# Patient Record
Sex: Female | Born: 1945 | Race: White | Hispanic: No | Smoking: Never smoker
Health system: Southern US, Community
[De-identification: ages and names within clinical notes are randomized; demographics above are authoritative.]

## PROBLEM LIST (undated history)

## (undated) DIAGNOSIS — M1990 Unspecified osteoarthritis, unspecified site: Secondary | ICD-10-CM

## (undated) DIAGNOSIS — E785 Hyperlipidemia, unspecified: Secondary | ICD-10-CM

## (undated) DIAGNOSIS — K219 Gastro-esophageal reflux disease without esophagitis: Secondary | ICD-10-CM

## (undated) DIAGNOSIS — M949 Disorder of cartilage, unspecified: Secondary | ICD-10-CM

## (undated) DIAGNOSIS — I10 Essential (primary) hypertension: Secondary | ICD-10-CM

## (undated) DIAGNOSIS — M545 Low back pain, unspecified: Secondary | ICD-10-CM

## (undated) DIAGNOSIS — J309 Allergic rhinitis, unspecified: Secondary | ICD-10-CM

## (undated) DIAGNOSIS — G43909 Migraine, unspecified, not intractable, without status migrainosus: Secondary | ICD-10-CM

## (undated) DIAGNOSIS — H8110 Benign paroxysmal vertigo, unspecified ear: Secondary | ICD-10-CM

## (undated) DIAGNOSIS — N200 Calculus of kidney: Secondary | ICD-10-CM

## (undated) DIAGNOSIS — M899 Disorder of bone, unspecified: Secondary | ICD-10-CM

## (undated) DIAGNOSIS — M810 Age-related osteoporosis without current pathological fracture: Secondary | ICD-10-CM

## (undated) DIAGNOSIS — E039 Hypothyroidism, unspecified: Secondary | ICD-10-CM

## (undated) HISTORY — DX: Unspecified osteoarthritis, unspecified site: M19.90

## (undated) HISTORY — DX: Age-related osteoporosis without current pathological fracture: M81.0

## (undated) HISTORY — DX: Low back pain, unspecified: M54.50

## (undated) HISTORY — DX: Disorder of cartilage, unspecified: M94.9

## (undated) HISTORY — DX: Gastro-esophageal reflux disease without esophagitis: K21.9

## (undated) HISTORY — DX: Migraine, unspecified, not intractable, without status migrainosus: G43.909

## (undated) HISTORY — DX: Calculus of kidney: N20.0

## (undated) HISTORY — DX: Allergic rhinitis, unspecified: J30.9

## (undated) HISTORY — DX: Essential (primary) hypertension: I10

## (undated) HISTORY — DX: Benign paroxysmal vertigo, unspecified ear: H81.10

## (undated) HISTORY — DX: Hyperlipidemia, unspecified: E78.5

## (undated) HISTORY — DX: Hypothyroidism, unspecified: E03.9

## (undated) HISTORY — DX: Disorder of bone, unspecified: M89.9

## (undated) HISTORY — DX: Low back pain: M54.5

---

## 1977-11-29 HISTORY — PX: TUBAL LIGATION: SHX77

## 1999-02-02 ENCOUNTER — Ambulatory Visit (HOSPITAL_COMMUNITY): Admission: RE | Admit: 1999-02-02 | Discharge: 1999-02-02 | Payer: Self-pay | Admitting: *Deleted

## 1999-02-09 ENCOUNTER — Ambulatory Visit (HOSPITAL_COMMUNITY): Admission: RE | Admit: 1999-02-09 | Discharge: 1999-02-09 | Payer: Self-pay | Admitting: Unknown Physician Specialty

## 1999-11-30 HISTORY — PX: HAND SURGERY: SHX662

## 2000-10-16 ENCOUNTER — Encounter: Payer: Self-pay | Admitting: Emergency Medicine

## 2000-10-16 ENCOUNTER — Emergency Department (HOSPITAL_COMMUNITY): Admission: EM | Admit: 2000-10-16 | Discharge: 2000-10-16 | Payer: Self-pay | Admitting: Emergency Medicine

## 2000-10-25 ENCOUNTER — Ambulatory Visit (HOSPITAL_BASED_OUTPATIENT_CLINIC_OR_DEPARTMENT_OTHER): Admission: RE | Admit: 2000-10-25 | Discharge: 2000-10-26 | Payer: Self-pay | Admitting: Orthopedic Surgery

## 2004-01-07 ENCOUNTER — Emergency Department (HOSPITAL_COMMUNITY): Admission: EM | Admit: 2004-01-07 | Discharge: 2004-01-07 | Payer: Self-pay

## 2004-03-23 ENCOUNTER — Ambulatory Visit (HOSPITAL_COMMUNITY): Admission: RE | Admit: 2004-03-23 | Discharge: 2004-03-23 | Payer: Self-pay | Admitting: Orthopaedic Surgery

## 2004-04-01 ENCOUNTER — Ambulatory Visit (HOSPITAL_COMMUNITY): Admission: RE | Admit: 2004-04-01 | Discharge: 2004-04-01 | Payer: Self-pay | Admitting: Family Medicine

## 2004-10-13 ENCOUNTER — Ambulatory Visit: Payer: Self-pay | Admitting: Internal Medicine

## 2004-10-27 ENCOUNTER — Ambulatory Visit: Payer: Self-pay | Admitting: Internal Medicine

## 2004-11-27 ENCOUNTER — Encounter: Admission: RE | Admit: 2004-11-27 | Discharge: 2004-11-27 | Payer: Self-pay | Admitting: Internal Medicine

## 2005-10-12 ENCOUNTER — Ambulatory Visit: Payer: Self-pay | Admitting: Internal Medicine

## 2006-05-19 ENCOUNTER — Ambulatory Visit: Payer: Self-pay | Admitting: Internal Medicine

## 2006-10-25 ENCOUNTER — Ambulatory Visit: Payer: Self-pay | Admitting: Internal Medicine

## 2008-03-20 ENCOUNTER — Ambulatory Visit: Payer: Self-pay | Admitting: Internal Medicine

## 2008-03-20 DIAGNOSIS — J069 Acute upper respiratory infection, unspecified: Secondary | ICD-10-CM | POA: Insufficient documentation

## 2008-03-20 DIAGNOSIS — R42 Dizziness and giddiness: Secondary | ICD-10-CM | POA: Insufficient documentation

## 2008-03-25 ENCOUNTER — Telehealth: Payer: Self-pay | Admitting: Family Medicine

## 2008-03-25 ENCOUNTER — Ambulatory Visit: Payer: Self-pay | Admitting: Internal Medicine

## 2008-03-25 DIAGNOSIS — R059 Cough, unspecified: Secondary | ICD-10-CM | POA: Insufficient documentation

## 2008-03-25 DIAGNOSIS — R05 Cough: Secondary | ICD-10-CM

## 2008-03-28 ENCOUNTER — Ambulatory Visit: Payer: Self-pay | Admitting: Internal Medicine

## 2008-04-01 ENCOUNTER — Telehealth: Payer: Self-pay | Admitting: Internal Medicine

## 2008-04-16 ENCOUNTER — Ambulatory Visit: Payer: Self-pay | Admitting: Internal Medicine

## 2008-05-07 ENCOUNTER — Ambulatory Visit: Payer: Self-pay | Admitting: Internal Medicine

## 2008-05-09 ENCOUNTER — Ambulatory Visit: Payer: Self-pay | Admitting: Cardiology

## 2008-05-09 ENCOUNTER — Telehealth: Payer: Self-pay | Admitting: Internal Medicine

## 2008-05-21 ENCOUNTER — Ambulatory Visit: Payer: Self-pay | Admitting: Internal Medicine

## 2008-05-21 DIAGNOSIS — K219 Gastro-esophageal reflux disease without esophagitis: Secondary | ICD-10-CM | POA: Insufficient documentation

## 2008-05-24 ENCOUNTER — Encounter: Payer: Self-pay | Admitting: Internal Medicine

## 2008-05-24 ENCOUNTER — Ambulatory Visit: Payer: Self-pay | Admitting: Internal Medicine

## 2008-06-17 ENCOUNTER — Ambulatory Visit: Payer: Self-pay | Admitting: Internal Medicine

## 2008-06-17 DIAGNOSIS — H9209 Otalgia, unspecified ear: Secondary | ICD-10-CM | POA: Insufficient documentation

## 2008-08-19 ENCOUNTER — Ambulatory Visit: Payer: Self-pay | Admitting: Internal Medicine

## 2008-11-11 ENCOUNTER — Ambulatory Visit: Payer: Self-pay | Admitting: Internal Medicine

## 2008-11-11 DIAGNOSIS — I10 Essential (primary) hypertension: Secondary | ICD-10-CM | POA: Insufficient documentation

## 2008-11-11 DIAGNOSIS — I498 Other specified cardiac arrhythmias: Secondary | ICD-10-CM | POA: Insufficient documentation

## 2008-11-26 ENCOUNTER — Telehealth (INDEPENDENT_AMBULATORY_CARE_PROVIDER_SITE_OTHER): Payer: Self-pay | Admitting: *Deleted

## 2008-12-03 ENCOUNTER — Ambulatory Visit: Payer: Self-pay | Admitting: Internal Medicine

## 2008-12-16 ENCOUNTER — Ambulatory Visit: Payer: Self-pay | Admitting: Internal Medicine

## 2008-12-16 LAB — CONVERTED CEMR LAB
Albumin: 3.8 g/dL (ref 3.5–5.2)
BUN: 12 mg/dL (ref 6–23)
Basophils Relative: 0 % (ref 0.0–3.0)
Calcium: 9.7 mg/dL (ref 8.4–10.5)
Chloride: 104 meq/L (ref 96–112)
Creatinine, Ser: 0.8 mg/dL (ref 0.4–1.2)
Eosinophils Relative: 2 % (ref 0.0–5.0)
Glucose, Bld: 124 mg/dL — ABNORMAL HIGH (ref 70–99)
H Pylori IgG: NEGATIVE
HCT: 39.9 % (ref 36.0–46.0)
HDL: 47.9 mg/dL (ref >39.0)
MCHC: 34.5 g/dL (ref 30.0–36.0)
Monocytes Absolute: 1 10*3/uL (ref 0.1–1.0)
Neutro Abs: 11.1 10*3/uL — ABNORMAL HIGH (ref 1.4–7.7)
Neutrophils Relative %: 73.4 % (ref 43.0–77.0)
Sodium: 140 meq/L (ref 135–145)
T4, Total: 10 ug/dL (ref 5.0–12.5)
Total Bilirubin: 1.3 mg/dL — ABNORMAL HIGH (ref 0.3–1.2)
Total CHOL/HDL Ratio: 7.2
VLDL: 30 mg/dL (ref 0–40)

## 2008-12-23 ENCOUNTER — Ambulatory Visit: Payer: Self-pay | Admitting: Internal Medicine

## 2008-12-23 DIAGNOSIS — K625 Hemorrhage of anus and rectum: Secondary | ICD-10-CM | POA: Insufficient documentation

## 2008-12-23 DIAGNOSIS — E785 Hyperlipidemia, unspecified: Secondary | ICD-10-CM | POA: Insufficient documentation

## 2008-12-23 DIAGNOSIS — R7301 Impaired fasting glucose: Secondary | ICD-10-CM | POA: Insufficient documentation

## 2009-01-20 ENCOUNTER — Ambulatory Visit: Payer: Self-pay | Admitting: Gastroenterology

## 2009-01-27 ENCOUNTER — Ambulatory Visit: Payer: Self-pay | Admitting: Internal Medicine

## 2009-01-27 LAB — CONVERTED CEMR LAB
ALT: 17 units/L (ref 0–35)
BUN: 24 mg/dL — ABNORMAL HIGH (ref 6–23)
CO2: 28 meq/L (ref 19–32)
Chloride: 104 meq/L (ref 96–112)
Glucose, Bld: 117 mg/dL — ABNORMAL HIGH (ref 70–99)
LDL Cholesterol: 96 mg/dL (ref 0–99)
Total CHOL/HDL Ratio: 3.4
VLDL: 20 mg/dL (ref 0–40)

## 2009-01-31 ENCOUNTER — Ambulatory Visit: Payer: Self-pay | Admitting: Gastroenterology

## 2009-01-31 LAB — HM COLONOSCOPY

## 2009-02-03 ENCOUNTER — Ambulatory Visit: Payer: Self-pay | Admitting: Internal Medicine

## 2009-02-03 ENCOUNTER — Telehealth: Payer: Self-pay | Admitting: Internal Medicine

## 2009-02-03 ENCOUNTER — Ambulatory Visit (HOSPITAL_BASED_OUTPATIENT_CLINIC_OR_DEPARTMENT_OTHER): Admission: RE | Admit: 2009-02-03 | Discharge: 2009-02-03 | Payer: Self-pay | Admitting: Internal Medicine

## 2009-02-03 ENCOUNTER — Ambulatory Visit: Payer: Self-pay | Admitting: Diagnostic Radiology

## 2009-02-03 DIAGNOSIS — M25569 Pain in unspecified knee: Secondary | ICD-10-CM | POA: Insufficient documentation

## 2009-02-20 ENCOUNTER — Encounter: Payer: Self-pay | Admitting: Internal Medicine

## 2009-02-20 ENCOUNTER — Encounter: Admission: RE | Admit: 2009-02-20 | Discharge: 2009-04-08 | Payer: Self-pay | Admitting: Internal Medicine

## 2009-03-17 ENCOUNTER — Ambulatory Visit: Payer: Self-pay | Admitting: Internal Medicine

## 2009-05-06 ENCOUNTER — Telehealth: Payer: Self-pay | Admitting: Internal Medicine

## 2009-05-09 ENCOUNTER — Telehealth: Payer: Self-pay | Admitting: Internal Medicine

## 2009-06-09 ENCOUNTER — Telehealth: Payer: Self-pay | Admitting: Family Medicine

## 2009-06-23 ENCOUNTER — Ambulatory Visit: Payer: Self-pay | Admitting: Internal Medicine

## 2009-06-23 DIAGNOSIS — T7020XA Unspecified effects of high altitude, initial encounter: Secondary | ICD-10-CM | POA: Insufficient documentation

## 2009-07-08 ENCOUNTER — Encounter: Payer: Self-pay | Admitting: Internal Medicine

## 2009-07-08 ENCOUNTER — Telehealth: Payer: Self-pay | Admitting: Internal Medicine

## 2009-09-03 ENCOUNTER — Ambulatory Visit: Payer: Self-pay | Admitting: Internal Medicine

## 2009-09-03 DIAGNOSIS — L259 Unspecified contact dermatitis, unspecified cause: Secondary | ICD-10-CM | POA: Insufficient documentation

## 2009-09-09 ENCOUNTER — Ambulatory Visit: Payer: Self-pay | Admitting: Internal Medicine

## 2009-09-09 LAB — CONVERTED CEMR LAB
ALT: 21 units/L (ref 0–35)
Alkaline Phosphatase: 81 units/L (ref 39–117)
BUN: 20 mg/dL (ref 6–23)
Bilirubin, Direct: 0.2 mg/dL (ref 0.0–0.3)
CO2: 23 meq/L (ref 19–32)
Calcium: 9.6 mg/dL (ref 8.4–10.5)
Creatinine, Ser: 0.9 mg/dL (ref 0.4–1.2)
GFR calc non Af Amer: 66.88 mL/min (ref >60)
Hgb A1c MFr Bld: 5.6 % (ref 4.6–6.5)
Potassium: 4.3 meq/L (ref 3.5–5.1)
TSH: 3.35 microintl units/mL (ref 0.35–5.50)
Total Bilirubin: 0.9 mg/dL (ref 0.3–1.2)
Total CHOL/HDL Ratio: 4

## 2009-09-15 ENCOUNTER — Ambulatory Visit: Payer: Self-pay | Admitting: Internal Medicine

## 2009-09-16 ENCOUNTER — Encounter: Admission: RE | Admit: 2009-09-16 | Discharge: 2009-09-24 | Payer: Self-pay | Admitting: Internal Medicine

## 2009-09-22 ENCOUNTER — Telehealth: Payer: Self-pay | Admitting: Internal Medicine

## 2009-09-24 ENCOUNTER — Telehealth: Payer: Self-pay | Admitting: Internal Medicine

## 2009-09-29 ENCOUNTER — Encounter: Payer: Self-pay | Admitting: Internal Medicine

## 2009-10-09 ENCOUNTER — Encounter: Payer: Self-pay | Admitting: Internal Medicine

## 2009-10-14 ENCOUNTER — Telehealth: Payer: Self-pay | Admitting: Internal Medicine

## 2009-11-14 ENCOUNTER — Ambulatory Visit: Payer: Self-pay | Admitting: Internal Medicine

## 2009-11-14 DIAGNOSIS — L919 Hypertrophic disorder of the skin, unspecified: Secondary | ICD-10-CM

## 2009-11-14 DIAGNOSIS — L239 Allergic contact dermatitis, unspecified cause: Secondary | ICD-10-CM | POA: Insufficient documentation

## 2009-11-14 DIAGNOSIS — L909 Atrophic disorder of skin, unspecified: Secondary | ICD-10-CM | POA: Insufficient documentation

## 2010-01-09 ENCOUNTER — Ambulatory Visit (HOSPITAL_BASED_OUTPATIENT_CLINIC_OR_DEPARTMENT_OTHER): Admission: RE | Admit: 2010-01-09 | Discharge: 2010-01-09 | Payer: Self-pay | Admitting: Internal Medicine

## 2010-01-09 ENCOUNTER — Ambulatory Visit: Payer: Self-pay | Admitting: Diagnostic Radiology

## 2010-01-09 ENCOUNTER — Telehealth: Payer: Self-pay | Admitting: Internal Medicine

## 2010-01-09 ENCOUNTER — Ambulatory Visit: Payer: Self-pay | Admitting: Internal Medicine

## 2010-01-09 DIAGNOSIS — N200 Calculus of kidney: Secondary | ICD-10-CM | POA: Insufficient documentation

## 2010-01-09 DIAGNOSIS — R109 Unspecified abdominal pain: Secondary | ICD-10-CM | POA: Insufficient documentation

## 2010-01-09 LAB — CONVERTED CEMR LAB
Bilirubin Urine: NEGATIVE
Glucose, Urine, Semiquant: NEGATIVE
Nitrite: NEGATIVE
Specific Gravity, Urine: >=1.03
pH: 5

## 2010-01-12 ENCOUNTER — Encounter: Payer: Self-pay | Admitting: Internal Medicine

## 2010-01-12 ENCOUNTER — Ambulatory Visit (HOSPITAL_BASED_OUTPATIENT_CLINIC_OR_DEPARTMENT_OTHER): Admission: RE | Admit: 2010-01-12 | Discharge: 2010-01-12 | Payer: Self-pay | Admitting: Urology

## 2010-01-12 DIAGNOSIS — Z87442 Personal history of urinary calculi: Secondary | ICD-10-CM | POA: Insufficient documentation

## 2010-01-12 HISTORY — PX: OTHER SURGICAL HISTORY: SHX169

## 2010-03-19 ENCOUNTER — Telehealth: Payer: Self-pay | Admitting: Internal Medicine

## 2010-05-08 ENCOUNTER — Ambulatory Visit: Payer: Self-pay | Admitting: Internal Medicine

## 2010-05-08 DIAGNOSIS — L609 Nail disorder, unspecified: Secondary | ICD-10-CM | POA: Insufficient documentation

## 2010-05-11 ENCOUNTER — Telehealth: Payer: Self-pay | Admitting: Internal Medicine

## 2010-05-15 ENCOUNTER — Telehealth: Payer: Self-pay | Admitting: Internal Medicine

## 2010-06-03 ENCOUNTER — Telehealth: Payer: Self-pay | Admitting: Internal Medicine

## 2010-06-17 ENCOUNTER — Telehealth: Payer: Self-pay | Admitting: Internal Medicine

## 2010-08-12 ENCOUNTER — Encounter: Payer: Self-pay | Admitting: Internal Medicine

## 2010-08-31 ENCOUNTER — Encounter: Payer: Self-pay | Admitting: Internal Medicine

## 2010-09-01 ENCOUNTER — Encounter: Payer: Self-pay | Admitting: Internal Medicine

## 2010-10-26 ENCOUNTER — Telehealth: Payer: Self-pay | Admitting: Internal Medicine

## 2010-10-26 ENCOUNTER — Encounter: Payer: Self-pay | Admitting: Internal Medicine

## 2010-10-26 LAB — CONVERTED CEMR LAB
Bilirubin, Direct: 0.1 mg/dL (ref 0.0–0.3)
Cholesterol: 178 mg/dL (ref 0–200)
HDL: 59 mg/dL (ref >39)
TSH: 1.879 microintl units/mL (ref 0.350–4.500)
Total Protein: 6.9 g/dL (ref 6.0–8.3)
Triglycerides: 89 mg/dL (ref <150)

## 2010-11-02 ENCOUNTER — Ambulatory Visit: Payer: Self-pay | Admitting: Internal Medicine

## 2010-11-02 ENCOUNTER — Ambulatory Visit (HOSPITAL_BASED_OUTPATIENT_CLINIC_OR_DEPARTMENT_OTHER)
Admission: RE | Admit: 2010-11-02 | Discharge: 2010-11-02 | Payer: Self-pay | Source: Home / Self Care | Admitting: Internal Medicine

## 2010-11-02 DIAGNOSIS — M810 Age-related osteoporosis without current pathological fracture: Secondary | ICD-10-CM | POA: Insufficient documentation

## 2010-11-02 DIAGNOSIS — E039 Hypothyroidism, unspecified: Secondary | ICD-10-CM | POA: Insufficient documentation

## 2010-11-02 DIAGNOSIS — M79609 Pain in unspecified limb: Secondary | ICD-10-CM | POA: Insufficient documentation

## 2010-11-02 LAB — CONVERTED CEMR LAB: Thyroperoxidase Ab SerPl-aCnc: <10 (ref <35.0)

## 2010-11-02 LAB — HM MAMMOGRAPHY

## 2010-11-09 ENCOUNTER — Encounter: Payer: Self-pay | Admitting: Internal Medicine

## 2010-11-16 ENCOUNTER — Ambulatory Visit: Payer: Self-pay | Admitting: Family Medicine

## 2010-11-16 ENCOUNTER — Encounter: Payer: Self-pay | Admitting: Internal Medicine

## 2010-11-16 ENCOUNTER — Telehealth: Payer: Self-pay | Admitting: Internal Medicine

## 2010-12-03 ENCOUNTER — Encounter: Payer: Self-pay | Admitting: Internal Medicine

## 2010-12-24 ENCOUNTER — Telehealth: Payer: Self-pay | Admitting: Internal Medicine

## 2010-12-31 NOTE — Letter (Signed)
   [place] at [facility] [address] [place], [place]  [address]  [place] Phone: [phone]      November 02, 2010   [place] [address] [place], [place] [address]  RE:  LAB RESULTS  Dear  Ms. Zahler,  The following is an interpretation of your most recent lab tests.  Please take note of any instructions provided or changes to medications that have resulted from your lab work.  LIVER FUNCTION TESTS:  Good - no changes needed  LIPID PANEL:  Good - no changes needed Triglyceride: 89   Cholesterol: 178   LDL: 101   HDL: 59   Chol/HDL%:  3.0 Ratio  THYROID STUDIES:  Thyroid studies normal TSH: 1.879           Sincerely Yours,    Dr. Thomos Lemons  Appended Document:  mailed

## 2010-12-31 NOTE — Progress Notes (Signed)
  Phone Note Outgoing Call   Summary of Call: call pt - CT scan shows 6 mm stone.  I suggest referral to urologist Initial call taken by: D. Thomos Lemons DO,  January 09, 2010 3:14 PM  Follow-up for Phone Call        Appt  [facility]  Dr   Annabell Howells   Feb  14   Pt notified  Follow-up by: Darral Dash,  January 12, 2010 9:28 AM  New Problems: RENAL CALCULUS, RIGHT (ICD-592.0)   New Problems: RENAL CALCULUS, RIGHT (ICD-592.0)

## 2010-12-31 NOTE — Progress Notes (Signed)
Summary: [facility] needs new code  Phone Note From Other Clinic   Caller: Provider Summary of Call: Vitamin D & T4 & TSH Medicare states 703.9 does not support these tests, please call [phone] [facility] Initial call taken by: Lannette Donath,  June 03, 2010 2:06 PM  Follow-up for Phone Call        call returned to [facility] at [facility] she was provided 272.4 for TSH and T4, which was acceptable, and she states they will have to write off the charges for the Vitamin D test. Follow-up by: Glendell Docker CMA,  June 04, 2010 9:09 AM

## 2010-12-31 NOTE — Progress Notes (Signed)
Summary: refill--simvastatin  Phone Note Refill Request Message from:  Fax from [facility] on December 24, 2010 11:38 AM  Refills Requested: Medication #1:  SIMVASTATIN 40 MG TABS one half tab by mouth qpm   Dosage confirmed as above?Dosage Confirmed   Supply Requested: 45   Last Refilled: 09/17/2010 Initial call taken by: Mervin Kung CMA (AAMA),  December 24, 2010 11:41 AM    Prescriptions: SIMVASTATIN 40 MG TABS (SIMVASTATIN) one half tab by mouth qpm  #45 x 0   Entered by:   Mervin Kung CMA (AAMA)   Authorized by:   D. Thomos Lemons DO   Signed by:   Mervin Kung CMA (AAMA) on 12/24/2010   Method used:   Faxed to ...       [facility] (mail-order)             , [place]         Ph: [phone]       Fax: [phone]   RxID:   [id number]

## 2010-12-31 NOTE — Assessment & Plan Note (Signed)
Summary: 6 month follow up/mhf--room 2   Vitals Entered By: Mervin Kung CMA (May 08, 2010 2:08 PM) Is Patient Diabetic? Yes   Primary Care Provider:  Dondra Spry DO   History of Present Illness:  65 y/o white female for follow up prev diagnosed with kidney stone had stone extraction with nephrologist feeling much better  still getting intermittent vertigo despite not taking bp meds  gerd - good as long as she takes her PPI  Allergies: 1)  ! Sulfa  Past History:  Past Medical History: Benign positional vertigo Hypertension History of allergic rhinitis    Asthma      Nephrolithiasis, hx of  Past Surgical History: Tubal ligation in 1979   Surgery of the right hand to rebuild joint by Dr. Josephine Igo in 2001       Review of Systems       c/o of ridges on fingernails.  she is concerned she may have underactive thyroid  Physical Exam  General:  alert, well-developed, and well-nourished.   Neck:  supple and no masses.   Lungs:  normal respiratory effort, normal breath sounds, and no wheezes.   Heart:  normal rate, regular rhythm, and no gallop.   Extremities:  No lower extremity edema    Impression & Recommendations:  Problem # 1:  RENAL CALCULUS, RIGHT (ICD-592.0) underwent extraction with Dr. Annabell Howells  Problem # 2:  NAIL DISORDER (ICD-703.9) she as ridges on her nails.  rule out hypothyroidism, vit d def Orders: T-TSH [phone]) T-T4, Free [phone]) T- * Misc. Laboratory test [phone])  Problem # 3:  HYPERTENSION, ESSENTIAL NOS (ICD-401.9) BP stable w/o meds.  BP meds stopped due to vertigo / dizziness BP today: 138/70 Prior BP: 150/90 (01/09/2010)  Labs Reviewed: K+: 4.3 (09/09/2009) Creat: : 0.9 (09/09/2009)   Chol: 176 (09/09/2009)   HDL: 49.80 (09/09/2009)   LDL: 110 (09/09/2009)   TG: 82.0 (09/09/2009)  Complete Medication List: 1)  Simvastatin 40 Mg Tabs (Simvastatin) .... One half tab by mouth qpm 2)  Fish Oil 1000 Mg Caps  (Omega-3 fatty acids) .... Take 2 tabs two times a day 3)  Psyllium 58.6 % Powd (Psyllium) .... Use as directed two times a day 4)  Black Cohosh 40 Mg Caps (Black cohosh) .... Take 1 tab by mouth at bedtime 5)  Glucosamine-chondroitin 1500-1200 Mg/26ml Liqd (Glucosamine-chondroitin) .... Take 1 tablet by mouth two times a day 6)  Multivitamins Tabs (Multiple vitamin) .... Take 1 tablet by mouth once a day 7)  Omeprazole 40 Mg Cpdr (Omeprazole) .... One by mouth once daily 8)  Fluticasone Propionate 50 Mcg/act Susp (Fluticasone propionate) .... 2 sprays each nostril once daily 9)  Levothyroxine Sodium 25 Mcg Tabs (Levothyroxine sodium) .... One by mouth once daily  Patient Instructions: 1)  Please schedule a follow-up appointment in 6 months. Prescriptions: FLUTICASONE PROPIONATE 50 MCG/ACT SUSP (FLUTICASONE PROPIONATE) 2 sprays each nostril once daily  #1 x 3   Entered and Authorized by:   D. Thomos Lemons DO   Signed by:   D. Thomos Lemons DO on 05/08/2010   Method used:   Electronically to        [facility]  [phone]* (retail)       [address]       [place], [place]  [address]       Ph: [phone] or [phone]       Fax: [phone]   RxID:  [id number] OMEPRAZOLE 40 MG CPDR (OMEPRAZOLE) one by mouth once daily  #30 x 5   Entered and Authorized by:   D. Thomos Lemons DO   Signed by:   D. Thomos Lemons DO on 05/08/2010   Method used:   Electronically to        [facility]  [phone]* (retail)       [address]       [place], [place]  [address]       Ph: [phone] or [phone]       Fax: [phone]   RxID:   [id number]     Current Allergies: ! SULFA

## 2010-12-31 NOTE — Progress Notes (Signed)
Summary: Lab Results  Phone Note Outgoing Call   Summary of Call: call pt - thyroid blood test is borderline.  I suggest we start thyroid medication.  see rx plz arrange repeat TSH and thyroid antibodies in 2 months 244.9  also have pt cut simvastatin dose in half Initial call taken by: D. Thomos Lemons DO,  May 11, 2010 2:08 PM  Follow-up for Phone Call        call placed to patient, patients father states patient ask that I call back later, he was advised to have patient to return call when she becomes available. [place] verbalized understanding and agrees Follow-up by: Glendell Docker CMA,  May 13, 2010 9:32 AM  Additional Follow-up for Phone Call Additional follow up Details #1::        patient returned phone call and asked to contacted at [phone] or [phone]. Spoke with patient  she states that she will be in [place] for all of August and the first 2 weeks in September. She has been informed of the Thyroid medication, and she would like that sent into [facility] [address], she is aware to cut the Simvastatin in half Additional Follow-up by: Glendell Docker CMA,  May 13, 2010 2:31 PM    New/Updated Medications: SIMVASTATIN 40 MG TABS (SIMVASTATIN) one half tab by mouth qpm

## 2010-12-31 NOTE — Progress Notes (Signed)
Summary: Omeprazole Refill  Phone Note Refill Request Message from:  Patient on October 26, 2010 10:43 AM  Refills Requested: Medication #1:  OMEPRAZOLE 40 MG CPDR one by mouth once daily   Dosage confirmed as above?Dosage Confirmed   Brand Name Necessary? No   Supply Requested: 3 months she needs a 90 day rx [facility]  [id number]   Method Requested: Electronic Next Appointment Scheduled: 11-02-2010 Dr Artist Pais  Initial call taken by: Roselle Locus,  October 26, 2010 10:44 AM  Follow-up for Phone Call        Rx faxed to pharmacy Follow-up by: Glendell Docker CMA,  October 26, 2010 2:10 PM    Prescriptions: OMEPRAZOLE 40 MG CPDR (OMEPRAZOLE) one by mouth once daily  #90 x 3   Entered by:   Glendell Docker CMA   Authorized by:   D. Thomos Lemons DO   Signed by:   Glendell Docker CMA on 10/26/2010   Method used:   [facility] ...       [facility]             , [place]         Ph:        Fax: [phone]   RxID:   [id number]

## 2010-12-31 NOTE — Progress Notes (Signed)
Summary: thyroid verification  Phone Note Call from Patient Call back at [phone] Can leave message on machine.   Caller: Patient Call For: D. Thomos Lemons DO Summary of Call: Pt called wanting to know where we sent thyroid rx.  Advised pt it has not been sent in yet but we will call her Monday and let her know when it has been completed.  Pt wants rx sent to [facility] [address].  What dose is pt. supposed to be taking?  Please advise.  Mervin Kung CMA  May 15, 2010 2:40 PM   Follow-up for Phone Call        patient advised per Dr Artist Pais instructions, rx sent to pharmacy Follow-up by: Glendell Docker CMA,  May 18, 2010 5:17 PM    New/Updated Medications: LEVOTHYROXINE SODIUM 25 MCG TABS (LEVOTHYROXINE SODIUM) one by mouth once daily Prescriptions: LEVOTHYROXINE SODIUM 25 MCG TABS (LEVOTHYROXINE SODIUM) one by mouth once daily  #30 x 2   Entered and Authorized by:   D. Thomos Lemons DO   Signed by:   D. Thomos Lemons DO on 05/18/2010   Method used:   Electronically to        [facility] (retail)       [address]       [place], [place]  [address]       Ph: [phone]       Fax: [phone]   RxID:   [id number]

## 2010-12-31 NOTE — Progress Notes (Signed)
Summary: Levothyroxine Refill  Phone Note Refill Request Message from:  Patient on November 16, 2010 9:16 AM  Refills Requested: Medication #1:  LEVOTHYROXINE SODIUM 25 MCG TABS one by mouth once daily.   Dosage confirmed as above?Dosage Confirmed   Brand Name Necessary? No   Supply Requested: 1 month SHE ORDERED FROM [facility] AND HER ORDER HAS NOT ARRIVED.  SHE IS OUT OF MEDS SHE NEEDS AN EMERGENCY FILL TO  [facility] [address]    Method Requested: Electronic Next Appointment Scheduled: 11-16-10 B[facility]  Initial call taken by: Roselle Locus,  November 16, 2010 9:17 AM  Follow-up for Phone Call        Rx completed in Dr. Tiajuana Amass Follow-up by: Glendell Docker CMA,  November 16, 2010 9:42 AM    Prescriptions: LEVOTHYROXINE SODIUM 25 MCG TABS (LEVOTHYROXINE SODIUM) one by mouth once daily  #30 x 0   Entered by:   Glendell Docker CMA   Authorized by:   D. Thomos Lemons DO   Signed by:   Glendell Docker CMA on 11/16/2010   Method used:   Electronically to        [facility] (retail)       [address]       [place], [place]  [address]       Ph: [phone]       Fax: [phone]   RxID:   [id number]

## 2010-12-31 NOTE — Miscellaneous (Signed)
Summary: flu vaccine  Clinical Lists Changes  Observations: Added new observation of FLU VAX: Historical (08/31/2010 13:31)      Immunization History:  Influenza Immunization History:    Influenza:  historical (08/31/2010)

## 2010-12-31 NOTE — Progress Notes (Signed)
Summary: refill--levothyroxine  Phone Note Refill Request Message from:  Fax from [facility]  Fax [phone]   Phone [phone] on 06/16/10 6:33pm  Refills Requested: Medication #1:  LEVOTHYROXINE SODIUM 25 MCG TABS one by mouth once daily.   Dosage confirmed as above?Dosage Confirmed   Supply Requested: 3 months   Last Refilled: 05/11/2010 Next Appointment Scheduled: 11-02-10 Dr. Artist Pais Initial call taken by: Mervin Kung CMA Duncan Dull),  June 17, 2010 11:14 AM    Prescriptions: LEVOTHYROXINE SODIUM 25 MCG TABS (LEVOTHYROXINE SODIUM) one by mouth once daily  #90 x 1   Entered by:   Mervin Kung CMA (AAMA)   Authorized by:   D. Thomos Lemons DO   Signed by:   Mervin Kung CMA (AAMA) on 06/17/2010   Method used:   [facility]ed [facility] ...       [facility]             , [place]         Ph:        Fax: [phone]   RxID:   [id number]

## 2010-12-31 NOTE — Progress Notes (Signed)
Summary: Lab order  Phone Note Call from Patient   Caller: Lab Details for Reason: Need lab order Summary of Call:    Lab needs order  pt in lab Initial call taken by: Darral Dash,  October 26, 2010 11:49 AM  Follow-up for Phone Call        Lab order faxed for Tsh & Thyroid Antibodies Follow-up by: Glendell Docker CMA,  October 26, 2010 12:00 PM

## 2010-12-31 NOTE — Letter (Signed)
   [place] at [facility] [address] [place], [place]  [address]  [place] Phone: [phone]      December 03, 2010   [place] [facility] [address] [place], [place] [address]  RE:  LAB RESULTS  Dear  Ms. Hughart,  The following is an interpretation of your most recent lab tests.  Please take note of any instructions provided or changes to medications that have resulted from your lab work.     Bone density scan shows osteopenia.    Please continue taking calcium and vitamin D supplements.         Sincerely Yours,    Dr. Thomos Lemons  Appended Document:  mailed

## 2010-12-31 NOTE — Consult Note (Signed)
Summary: [facility]  [facility]   Imported By: Lanelle Bal 12/03/2010 14:15:49  _____________________________________________________________________  External Attachment:    Type:   Image     Comment:   External Document

## 2010-12-31 NOTE — Progress Notes (Signed)
Summary: refill--simvastatin  Phone Note Refill Request Message from:  [facility]ax [facility] on June 17, 2010 4:25 PM  Refills Requested: Medication #1:  SIMVASTATIN 40 MG TABS one half tab by mouth qpm   Dosage confirmed as above?Dosage Confirmed   Supply Requested: 3 months Next Appointment Scheduled: 11/12/10  Dr. Artist Pais Initial call taken by: Mervin Kung CMA Duncan Dull),  June 17, 2010 4:25 PM    Prescriptions: SIMVASTATIN 40 MG TABS (SIMVASTATIN) one half tab by mouth qpm  #90 x 1   Entered by:   Mervin Kung CMA (AAMA)   Authorized by:   D. Thomos Lemons DO   Signed by:   Mervin Kung CMA (AAMA) on 06/17/2010   Method used:   [facility]       [facility]             , [place]         Ph:        Fax: [phone]   RxID:   [id number]   Appended Document: refill--simvastatin Rx of 06/17/10 was sent with incorrect quantity. Spoke with Heidi @ [phone] and corrected quantity to #45.

## 2010-12-31 NOTE — Progress Notes (Signed)
Summary: Simvastatin Refill  Phone Note Call from Patient   Caller: Patient Details for Reason: Changing Pharmacy Summary of Call: Pt is changing pharmacy needs her  Simvastatin 40mg    sent to [facility] Initial call taken by: Darral Dash,  March 19, 2010 10:35 AM  Follow-up for Phone Call        Rx sent to pharmacy Follow-up by: Glendell Docker CMA,  March 19, 2010 11:17 AM    Prescriptions: SIMVASTATIN 40 MG TABS (SIMVASTATIN) one by mouth qpm  #30 x 5   Entered by:   Glendell Docker CMA   Authorized by:   D. Thomos Lemons DO   Signed by:   Glendell Docker CMA on 03/19/2010   Method used:   Electronically to        [facility]  [phone]* (retail)       [address]       [place], [place]  [address]       Ph: [phone] or [phone]       Fax: [phone]   RxID:   [id number]

## 2010-12-31 NOTE — Assessment & Plan Note (Signed)
Summary: pain kidney stone?/ 11:15/mhf   Vital Signs:  Patient profile:   65 year old female Weight:      139.75 pounds BMI:     27.85 O2 Sat:      98 % on Room air Temp:     97.4 degrees F oral Pulse rate:   73 / minute Pulse rhythm:   regular Resp:     18 per minute BP sitting:   150 / 90  (left arm) Cuff size:   regular  Vitals Entered By: Glendell Docker CMA (January 09, 2010 11:15 AM)  O2 Flow:  Room air  Primary Care Provider:  D. Thomos Lemons DO  CC:  Abdominal pain.  History of Present Illness:  Abdominal Pain      This is a 65 year old woman who presents with Abdominal pain.  The patient reports vomiting, but denies diarrhea.  The location of the pain is right lower quadrant.  Pt has severe intermittent pain.  Associated symptoms include dysuria.  no fever or chills.  she has hx of kidney stones  Allergies: 1)  ! Sulfa  Past History:  Past Medical History: Benign positional vertigo Hypertension History of allergic rhinitis    Asthma     Nephrolithiasis, hx of  Past Surgical History: Tubal ligation in 1979   Surgery of the right hand to rebuild joint by Dr. Josephine Igo in 2001      Family History: Father has CAD and prostate cancer [name])  Mother has htn, hyperlipidemia no colon cancer       Social History: Occupation: Contractor.   No tobacco Alcohol on an occasional basis.    widowed      Physical Exam  General:  alert, well-developed, and well-nourished.   Lungs:  normal respiratory effort and normal breath sounds.   Heart:  normal rate, regular rhythm, no murmur, and no gallop.   Abdomen:  soft.  right mid lower abd tenderness.  no RUQ tenderness,  mild CVA tenderness Extremities:  No lower extremity edema    Impression & Recommendations:  Problem # 1:  FLANK PAIN, RIGHT (ICD-789.09) 65 y/o with symptoms of renal colic.  Pain control and increase fluids.  Check CT of abd and pelvis.  If stone too large to pass,  refer to urology  for further eval and tx.  Orders: CT without Contrast (CT w/o contrast) UA Dipstick w/o Micro (manual) (91478)  Her updated medication list for this problem includes:    Hydromorphone Hcl 2 Mg Tabs (Hydromorphone hcl) ..... One by mouth three times a day as needed  Complete Medication List: 1)  Nexium 40 Mg Cpdr (Esomeprazole magnesium) .... One by mouth once daily  30 mins before am meal 2)  Simvastatin 40 Mg Tabs (Simvastatin) .... One by mouth qpm 3)  Fish Oil 1000 Mg Caps (Omega-3 fatty acids) .... Take 2 tabs two times a day 4)  Psyllium 58.6 % Powd (Psyllium) .... Use as directed two times a day 5)  Black Cohosh 40 Mg Caps (Black cohosh) .... Take 1 tab by mouth at bedtime 6)  Glucosamine-chondroitin 1500-1200 Mg/65ml Liqd (Glucosamine-chondroitin) .... Take 1 tablet by mouth two times a day 7)  Multivitamins Tabs (Multiple vitamin) .... Take 1 tablet by mouth once a day 8)  Hydromorphone Hcl 2 Mg Tabs (Hydromorphone hcl) .... One by mouth three times a day as needed 9)  Ondansetron Hcl 4 Mg Tabs (Ondansetron hcl) .... One by mouth three times a day  as needed nausea  Patient Instructions: 1)  Go to the ER if pain, nausea and vomiting gets worse. 2)  Increase fluid intake Prescriptions: ONDANSETRON HCL 4 MG TABS (ONDANSETRON HCL) one by mouth three times a day as needed nausea  #21 x 0   Entered and Authorized by:   D. Thomos Lemons DO   Signed by:   D. Thomos Lemons DO on 01/09/2010   Method used:   Electronically to        [facility] (retail)       [address]       [place], [place]  [address]       Ph: [phone]       Fax: [phone]   RxID:   [id number] HYDROMORPHONE HCL 2 MG TABS (HYDROMORPHONE HCL) one by mouth three times a day as needed  #30 x 0   Entered and Authorized by:   D. Thomos Lemons DO   Signed by:   D. Thomos Lemons DO on 01/09/2010   Method used:   Print then Give to Patient   RxID:    [id number]   Current Allergies (reviewed today): ! SULFA    Laboratory Results   Urine Tests    Routine Urinalysis   Color: straw Appearance: Hazy Glucose: negative   (Normal Range: Negative) Bilirubin: negative   (Normal Range: Negative) Ketone: >= (160)   (Normal Range: Negative) Spec. Gravity: >=1.030   (Normal Range: 1.003-1.035) Blood: large   (Normal Range: Negative) pH: 5.0   (Normal Range: 5.0-8.0) Protein: 100   (Normal Range: Negative) Urobilinogen: 0.2   (Normal Range: 0-1) Nitrite: negative   (Normal Range: Negative) Leukocyte Esterace: negative   (Normal Range: Negative)

## 2010-12-31 NOTE — Miscellaneous (Signed)
Summary: [facility]  [facility]   Imported By: Lanelle Bal 09/08/2010 10:19:19  _____________________________________________________________________  External Attachment:    Type:   Image     Comment:   External Document

## 2010-12-31 NOTE — Assessment & Plan Note (Signed)
Summary: 6 month follow up/mhf   Vital Signs:  Patient profile:   65 year old female Height:      59.5 inches Weight:      130 pounds BMI:     25.91 O2 Sat:      97 % on Room air Temp:     97.8 degrees F oral Pulse rate:   76 / minute Resp:     18 per minute BP sitting:   110 / 80  (left arm) Cuff size:   regular  Vitals Entered By: Glendell Docker CMA (November 02, 2010 2:35 PM)  O2 Flow:  Room air  Contraindications/Deferment of Procedures/Staging:    Test/Procedure: PAP Smear    Reason for deferment: patient declined     Test/Procedure: Mammogram    Reason for deferment: patient declined  CC: 6 month follow up Is Patient Diabetic? No Pain Assessment Patient in pain? yes     Location: foot Intensity: 8 Type: aching Onset of pain  Constant Comments c/o right foot pain ongoing for the pat 6 months with applied pressure , vertigo has resolved, discuss thyroid medication, if she is to continue with medication     Last PAP Result Declined   Primary Care Provider:  Dondra Spry DO  CC:  6 month follow up.  History of Present Illness: 65 y/o female c/o right foot pain  location - front / ball of foot worse with activity (scottish dancing) no numbness  Allergies: 1)  ! Sulfa  Past History:  Past Medical History: Benign positional vertigo Hypertension  History of allergic rhinitis    Asthma      Nephrolithiasis, hx of  Social History: Occupation: Contractor.   No tobacco Alcohol on an occasional basis.     widowed        Physical Exam  General:  alert, well-developed, and well-nourished.   Lungs:  normal respiratory effort, normal breath sounds, and no wheezes.   Heart:  normal rate, regular rhythm, and no gallop.   Msk:  no joint warmth, no redness over joints, no joint deformities, and no joint instability.     Impression & Recommendations:  Problem # 1:  FOOT PAIN, RIGHT (ICD-729.5) Assessment New chronic right foot pain.   questions DJD vs  morton's neuroma refer to ortho for further eval and tx  Orders: T-Foot Right (73630TC) Orthopedic Referral (Ortho)  Problem # 2:  HYPOTHYROIDISM (ICD-244.9) Assessment: Unchanged  Her updated medication list for this problem includes:    Levothyroxine Sodium 25 Mcg Tabs (Levothyroxine sodium) ..... One by mouth once daily  Orders: T- * Misc. Laboratory test [phone])  Complete Medication List: 1)  Simvastatin 40 Mg Tabs (Simvastatin) .... One half tab by mouth qpm 2)  Fish Oil 1000 Mg Caps (Omega-3 fatty acids) .... Take 2 tabs two times a day 3)  Psyllium 58.6 % Powd (Psyllium) .... Use as directed two times a day 4)  Black Cohosh 40 Mg Caps (Black cohosh) .... Take 1 tab by mouth at bedtime 5)  Glucosamine-chondroitin 1500-1200 Mg/73ml Liqd (Glucosamine-chondroitin) .... Take 1 tablet by mouth two times a day 6)  Multivitamins Tabs (Multiple vitamin) .... Take 1 tablet by mouth once a day 7)  Omeprazole 40 Mg Cpdr (Omeprazole) .... One by mouth once daily 8)  Fluticasone Propionate 50 Mcg/act Susp (Fluticasone propionate) .... 2 sprays each nostril once daily 9)  Levothyroxine Sodium 25 Mcg Tabs (Levothyroxine sodium) .... One by mouth once daily  Other Orders: Tdap =>  68yrs IM (60454) Admin 1st Vaccine ([address]) Radiology Referral (Radiology)  Patient Instructions: 1)  Use voltaren gel as directed for right foot pain 2)  Try taping your right foot before exercise 3)  Our office will contact you re:  orthopedic referral and bone density scan Prescriptions: LEVOTHYROXINE SODIUM 25 MCG TABS (LEVOTHYROXINE SODIUM) one by mouth once daily  #90 x 1   Entered and Authorized by:   D. Thomos Lemons DO   Signed by:   D. Thomos Lemons DO on 11/02/2010   Method used:   Electronically to        [facility]  [phone]* (retail)       [address]       [place], [place]  [address]       Ph: [phone] or [phone]       Fax: [phone]   RxID:    [id number]    Orders Added: 1)  T-Foot Right [73630TC] 2)  T- * Misc. Laboratory test [99999] 3)  Tdap => 65yrs IM [90715] 4)  Admin 1st Vaccine [90471] 5)  Orthopedic Referral [Ortho] 6)  Radiology Referral [Radiology] 7)  Est. Patient Level III [38756]   Immunizations Administered:  Tetanus Vaccine:    Vaccine Type: Tdap    Site: left deltoid    Mfr: GlaxoSmithKline    Dose: 0.5 ml    Route: IM    Given by: Glendell Docker CMA    Exp. Date: 09/17/2012    Lot #: [id number]    VIS given: 10/16/08 version given November 02, 2010.   Immunizations Administered:  Tetanus Vaccine:    Vaccine Type: Tdap    Site: left deltoid    Mfr: GlaxoSmithKline    Dose: 0.5 ml    Route: IM    Given by: Glendell Docker CMA    Exp. Date: 09/17/2012    Lot #: [id number]    VIS given: 10/16/08 version given November 02, 2010.  Current Allergies (reviewed today): ! SULFA    Preventive Care Screening  Mammogram:    Date:  11/02/2010    Results:  Declined  Pap Smear:    Date:  11/02/2010    Results:  Declined  Bone Density:    Date:  07/18/2007    Results:  normal std dev

## 2010-12-31 NOTE — Miscellaneous (Signed)
Summary: BONE DENSITY  Clinical Lists Changes  Orders: Added new Test order of T-Bone Densitometry (77080) - Signed Added new Test order of T-Lumbar Vertebral Assessment (77082) - Signed 

## 2010-12-31 NOTE — Consult Note (Signed)
Summary: [facility]   Imported By: Lanelle Bal 01/22/2010 10:43:36  _____________________________________________________________________  External Attachment:    Type:   Image     Comment:   External Document

## 2010-12-31 NOTE — Letter (Signed)
Summary: [facility]  [facility]   Imported By: Lanelle Bal 08/26/2010 14:22:34  _____________________________________________________________________  External Attachment:    Type:   Image     Comment:   External Document

## 2011-01-04 ENCOUNTER — Ambulatory Visit (INDEPENDENT_AMBULATORY_CARE_PROVIDER_SITE_OTHER): Payer: Medicare Other | Admitting: Internal Medicine

## 2011-01-04 ENCOUNTER — Encounter: Payer: Self-pay | Admitting: Internal Medicine

## 2011-01-04 DIAGNOSIS — J018 Other acute sinusitis: Secondary | ICD-10-CM | POA: Insufficient documentation

## 2011-01-26 NOTE — Assessment & Plan Note (Signed)
Summary: sinus infection/cough/hea--rm 2   Vital Signs:  Patient profile:   65 year old female Height:      59.5 inches Weight:      134.25 pounds BMI:     26.76 O2 Sat:      98 % on Room air Temp:     97.8 degrees F oral Pulse rate:   78 / minute Pulse rhythm:   regular Resp:     18 per minute BP sitting:   138 / 86  (left arm) Cuff size:   regular  Vitals Entered By: Mervin Kung CMA Duncan Dull) (January 04, 2011 8:48 AM)  O2 Flow:  Room air CC: Pt states she has had cold symptoms x 2 weeks, now having head pressure and cough., URI symptoms Is Patient Diabetic? Yes Pain Assessment Patient in pain? no      Comments Pt has taken Mucinex DM and Tylenol. Mervin Kung CMA Duncan Dull)  January 04, 2011 8:57 AM    Primary Care Provider:  Dondra Spry DO  CC:  Pt states she has had cold symptoms x 2 weeks, now having head pressure and cough., and URI symptoms.  History of Present Illness:  URI Symptoms      This is a 65 year old woman who presents with URI symptoms.  The patient reports nasal congestion and productive cough.  The patient denies fever and low-grade fever (<100.5 degrees).    Preventive Screening-Counseling & Management  Alcohol-Tobacco     Alcohol drinks/day: <1     Alcohol type: Wine     Smoking Status: never  Allergies: 1)  ! Sulfa  Past History:  Past Medical History: Benign positional vertigo Hypertension  History of allergic rhinitis     Asthma      Nephrolithiasis, hx of  Past Surgical History: Tubal ligation in 1979   Surgery of the right hand to rebuild joint by Dr. Josephine Igo in 2001        Family History: Father has CAD and prostate cancer [name])  Mother has htn, hyperlipidemia no colon cancer        Social History: Occupation: Contractor.   No tobacco Alcohol on an occasional basis.     widowed         Physical Exam  General:  alert, well-developed, and well-nourished.   Ears:  R ear normal and L ear normal.     Mouth:  pharyngeal erythema.   Lungs:  normal respiratory effort, normal breath sounds, and no wheezes.   Heart:  normal rate, regular rhythm, and no gallop.     Impression & Recommendations:  Problem # 1:  RHINOSINUSITIS, ACUTE (ICD-461.8)  Her updated medication list for this problem includes:    Fluticasone Propionate 50 Mcg/act Susp (Fluticasone propionate) .Marland Kitchen... 2 sprays each nostril once daily    Mucinex Dm 30-600 Mg Xr12h-tab (Dextromethorphan-guaifenesin) .Marland Kitchen... Take 1 tablet by mouth two times a day.    Amoxicillin 875 Mg Tabs (Amoxicillin) ..... One by mouth two times a day    Ipratropium Bromide 0.03 % Soln (Ipratropium bromide) .Marland Kitchen... 2 sprays each nostril two times a day as needed  Instructed on treatment. Call if symptoms persist or worsen.   Complete Medication List: 1)  Simvastatin 40 Mg Tabs (Simvastatin) .... One half tab by mouth qpm 2)  Fish Oil 1000 Mg Caps (Omega-3 fatty acids) .... Take 2 tabs two times a day 3)  Psyllium 58.6 % Powd (Psyllium) .... Use as directed two  times a day 4)  Black Cohosh 40 Mg Caps (Black cohosh) .... Take 1 tab by mouth at bedtime 5)  Glucosamine-chondroitin 1500-1200 Mg/65ml Liqd (Glucosamine-chondroitin) .... Take 1 tablet by mouth two times a day 6)  Multivitamins Tabs (Multiple vitamin) .... Take 1 tablet by mouth once a day 7)  Omeprazole 40 Mg Cpdr (Omeprazole) .... One by mouth once daily 8)  Fluticasone Propionate 50 Mcg/act Susp (Fluticasone propionate) .... 2 sprays each nostril once daily 9)  Levothyroxine Sodium 25 Mcg Tabs (Levothyroxine sodium) .... One by mouth once daily 10)  Mucinex Dm 30-600 Mg Xr12h-tab (Dextromethorphan-guaifenesin) .... Take 1 tablet by mouth two times a day. 11)  Amoxicillin 875 Mg Tabs (Amoxicillin) .... One by mouth two times a day 12)  Ipratropium Bromide 0.03 % Soln (Ipratropium bromide) .... 2 sprays each nostril two times a day as needed  Patient Instructions: 1)  Call our office if your  symptoms do not  improve or gets worse. Prescriptions: IPRATROPIUM BROMIDE 0.03 % SOLN (IPRATROPIUM BROMIDE) 2 sprays each nostril two times a day as needed  #1 bottle x 0   Entered and Authorized by:   D. Thomos Lemons DO   Signed by:   D. Thomos Lemons DO on 01/04/2011   Method used:   Electronically to        [facility] (retail)       [address]       [place], [place]  [address]       Ph: [phone]       Fax: [phone]   RxID:   [id number] AMOXICILLIN 875 MG TABS (AMOXICILLIN) one by mouth two times a day  #20 x 0   Entered and Authorized by:   D. Thomos Lemons DO   Signed by:   D. Thomos Lemons DO on 01/04/2011   Method used:   Electronically to        [facility] (retail)       [address]       [place], [place]  [address]       Ph: [phone]       Fax: [phone]   RxID:   [id number] AMOXICILLIN 875 MG TABS (AMOXICILLIN) one by mouth two times a day  #20 x 0   Entered and Authorized by:   D. Thomos Lemons DO   Signed by:   D. Thomos Lemons DO on 01/04/2011   Method used:   Electronically to        [facility]  [phone]* (retail)       [address]       [place], [place]  [address]       Ph: [phone] or [phone]       Fax: [phone]   RxID:   [id number] IPRATROPIUM BROMIDE 0.03 % SOLN (IPRATROPIUM BROMIDE) 2 sprays each nostril two times a day as needed  #1 bottle x 0   Entered and Authorized by:   D. Thomos Lemons DO   Signed by:   D. Thomos Lemons DO on 01/04/2011   Method used:   Electronically to        [facility]  [phone]* (retail)       [address]       [place]  [place], [place]  [address]       Ph: [phone] or [phone]       Fax: [phone]   RxID:   [id number] AMOXICILLIN 875 MG TABS (AMOXICILLIN) one by mouth two times a day  #14 x 0   Entered and Authorized by:   D.  Thomos Lemons DO   Signed by:   D. Thomos Lemons DO on 01/04/2011   Method used:   Electronically to        [facility]  [phone]* (retail)       [address]       [place], [place]  [address]       Ph: [phone] or [phone]       Fax: [phone]   RxID:   [id number]  Rxs to [facility] have been cancelled and sent to [facility] per pt's request. Mervin Kung CMA Duncan Dull)  January 04, 2011 10:36 AM   Orders Added: 1)  Est. Patient Level III [[address]]     Current Allergies (reviewed today): ! SULFA

## 2011-01-28 ENCOUNTER — Telehealth: Payer: Self-pay | Admitting: Internal Medicine

## 2011-02-04 NOTE — Progress Notes (Addendum)
Summary: refill--levothyroxine  Phone Note Refill Request Message from:  Fax from [facility] on January 28, 2011 3:04 PM  Refills Requested: Medication #1:  LEVOTHYROXINE SODIUM 25 MCG TABS one by mouth once daily   Dosage confirmed as above?Dosage Confirmed   Supply Requested: 3 months   Last Refilled: 11/16/2010 Next Appointment Scheduled: none Initial call taken by: Mervin Kung CMA (AAMA),  January 28, 2011 3:04 PM    Prescriptions: LEVOTHYROXINE SODIUM 25 MCG TABS (LEVOTHYROXINE SODIUM) one by mouth once daily  #90 x 1   Entered by:   Mervin Kung CMA (AAMA)   Authorized by:   D. Thomos Lemons DO   Signed by:   Mervin Kung CMA (AAMA) on 01/28/2011   Method used:   Faxed to ...       [facility] (mail-order)             , [place]         Ph: [phone]       Fax: [phone]   RxID:   [id number]   Appended Document: refill--levothyroxine Received fax request from [facility] for Levothyroxine. Spoke to [place] at [facility], she verifed receipt of previous refill and will take fax request out of their system.

## 2011-02-17 LAB — POCT HEMOGLOBIN-HEMACUE: Hemoglobin: 13.2 g/dL (ref 12.0–15.0)

## 2011-03-04 ENCOUNTER — Other Ambulatory Visit: Payer: Self-pay | Admitting: *Deleted

## 2011-03-04 NOTE — Telephone Encounter (Signed)
Rx refill on Simvastatin 40 1/2 tablet by mouth every evening

## 2011-03-09 ENCOUNTER — Encounter: Payer: Self-pay | Admitting: Family

## 2011-03-09 ENCOUNTER — Ambulatory Visit (INDEPENDENT_AMBULATORY_CARE_PROVIDER_SITE_OTHER): Payer: Medicare Other | Admitting: Family

## 2011-03-09 VITALS — BP 140/90 | HR 108 | Temp 98.8°F | Resp 18 | Ht 59.49 in | Wt 139.1 lb

## 2011-03-09 DIAGNOSIS — M545 Low back pain, unspecified: Secondary | ICD-10-CM

## 2011-03-09 DIAGNOSIS — S335XXA Sprain of ligaments of lumbar spine, initial encounter: Secondary | ICD-10-CM

## 2011-03-09 DIAGNOSIS — S39012A Strain of muscle, fascia and tendon of lower back, initial encounter: Secondary | ICD-10-CM

## 2011-03-09 MED ORDER — CYCLOBENZAPRINE HCL 5 MG PO TABS: 5.0000 mg | ORAL_TABLET | Freq: Three times a day (TID) | ORAL | Status: DC | PRN

## 2011-03-09 MED ORDER — PREDNISONE 10 MG PO TABS: ORAL_TABLET | ORAL | Status: DC

## 2011-03-09 NOTE — Progress Notes (Signed)
  Subjective:    Patient ID: Tiffany House, female    DOB: Jan 05, 1946, 65 y.o.   MRN: [id number]  HPI  Ms. Spindel is a 65 year old female with cc of low back pain.  Pain started yesterday when she stood up from stooped position yesterday.  Notes that her pain was severe, but that she continued to garden.  She then used a heating pad, tylenol and rested.  Pain would be worse with movement and is sharp, feels like a tight band "like someone twisting a turnicat."  Denies hx of back pain this severe.  Pain is non-radiating.     Review of Systems See HPI  Past Medical History  Diagnosis Date  . Benign positional vertigo   . Allergy     allergic rhinitis  . Hypertension   . Asthma   . History of nephrolithiasis     History   Social History  . Marital Status: Widowed    Spouse Name: N/A    Number of Children: N/A  . Years of Education: N/A   Occupational History  . Designer    Social History Main Topics  . Smoking status: Never Smoker   . Smokeless tobacco: Never Used  . Alcohol Use: Yes     occasional  . Drug Use: Not on file  . Sexually Active: Not on file   Other Topics Concern  . Not on file   Social History Narrative  . No narrative on file    Past Surgical History  Procedure Date  . Tubal ligation 1979  . Hand surgery 2001    Dr Teressa Senter-- surgery of right hand to rebuild joint    Family History  Problem Relation Age of Onset  . Hyperlipidemia Mother   . Hypertension Mother   . Cancer Father     prostate. [name]  . Coronary artery disease Father     Allergies  Allergen Reactions  . Sulfonamide Derivatives     No current outpatient prescriptions on file prior to visit.    BP 140/90  Pulse 108  Temp(Src) 98.8 F (37.1 C) (Oral)  Resp 18  Ht 4' 11.49" (1.511 m)  Wt 139 lb 1.9 oz (63.104 kg)  BMI 27.64 kg/m2       Objective:   Physical Exam  Constitutional:       Uncomfortable appearing white female.   Cardiovascular: Normal rate and  regular rhythm.   Pulmonary/Chest: Effort normal and breath sounds normal.  Musculoskeletal:       + back pain with flexion/extension.  Worst with lateral bending. Increased pain with R straight leg raise.  Hyperreflexive patellar reflexes.          Assessment & Plan:

## 2011-03-09 NOTE — Patient Instructions (Signed)
Call if your pain worsens or does not continue to improve. Do not drive while taking flexeril.

## 2011-03-09 NOTE — Assessment & Plan Note (Signed)
+   low back pain with spasm. Check plain films of LS spine. Will treat with flexeril and prednisone taper.  Pt to f/u with Dr. Artist Pais in 1 month.  If symptoms worsen or do not improve, consider MRI of the Lspine

## 2011-03-23 ENCOUNTER — Encounter: Payer: Self-pay | Admitting: Internal Medicine

## 2011-03-23 ENCOUNTER — Ambulatory Visit (INDEPENDENT_AMBULATORY_CARE_PROVIDER_SITE_OTHER): Payer: Medicare Other | Admitting: Internal Medicine

## 2011-03-23 VITALS — BP 110/70 | HR 75 | Temp 97.5°F | Resp 18 | Wt 140.0 lb

## 2011-03-23 DIAGNOSIS — M545 Low back pain, unspecified: Secondary | ICD-10-CM

## 2011-03-23 MED ORDER — TRAMADOL HCL 50 MG PO TABS: ORAL_TABLET | ORAL | Status: DC

## 2011-03-23 NOTE — Assessment & Plan Note (Signed)
65 year old with refractory low back pain I suspect pain is discogenic No improvement with prednisone taper and muscle relaxers Obtain MRI of lumbar spine Use tramadol as directed

## 2011-03-23 NOTE — Progress Notes (Signed)
Subjective:    Patient ID: Tiffany House, female    DOB: [birth year], 65 y.o.   MRN: [id number]  HPI 65 year old female for followup regarding low back pain. Patient seen on 03/09/2011 for acute lumbar strain. She was working in the yard picking up small sticks.  She felt like something "grabbed" her low back.  Patient treated with prednisone taper and muscle relaxers. Initially her symptoms seemed to improve but she is still having tight band sensation across her lower back.  Persistent pain and decreased ROM.  She denies radiation of pain to her lower extremities. She denies leg weakness.  Review of Systems   Past Medical History  Diagnosis Date  . Benign positional vertigo   . Allergy     allergic rhinitis  . Hypertension   . Asthma   . History of nephrolithiasis     History   Social History  . Marital Status: Widowed    Spouse Name: N/A    Number of Children: N/A  . Years of Education: N/A   Occupational History  . Designer    Social History Main Topics  . Smoking status: Never Smoker   . Smokeless tobacco: Never Used  . Alcohol Use: Yes     occasional  . Drug Use: Not on file  . Sexually Active: Not on file   Other Topics Concern  . Not on file   Social History Narrative  . No narrative on file    Past Surgical History  Procedure Date  . Tubal ligation 1979  . Hand surgery 2001    Dr Teressa Senter-- surgery of right hand to rebuild joint    Family History  Problem Relation Age of Onset  . Hyperlipidemia Mother   . Hypertension Mother   . Cancer Father     prostate. [name]  . Coronary artery disease Father     Allergies  Allergen Reactions  . Sulfonamide Derivatives     Current Outpatient Prescriptions on File Prior to Visit  Medication Sig Dispense Refill  . Black Cohosh 40 MG CAPS Take 1 capsule by mouth at bedtime.        . calcium carbonate (CALCIUM 500) 1250 MG tablet Take 2 tablets by mouth daily.        . cyclobenzaprine (FLEXERIL) 5  MG tablet Take 1 tablet (5 mg total) by mouth every 8 (eight) hours as needed for muscle spasms.  30 tablet  1  . fish oil-omega-3 fatty acids 1000 MG capsule Take 2 capsules by mouth daily.        . Glucosamine-Chondroitin-Vit D3 1500-1200-800 MG-MG-UNIT PACK Take 1 tablet by mouth 2 (two) times daily.        Marland Kitchen levothyroxine (SYNTHROID, LEVOTHROID) 25 MCG tablet Take 25 mcg by mouth daily.        . Multiple Vitamin (MULTIVITAMIN) tablet Take 1 tablet by mouth daily.        Marland Kitchen omeprazole (PRILOSEC) 40 MG capsule Take 40 mg by mouth daily.        . psyllium (METAMUCIL) 58.6 % powder Take 1 packet by mouth 2 (two) times daily as needed.        . simvastatin (ZOCOR) 40 MG tablet Take 1/2 tablet by mouth every evening.       Marland Kitchen dextromethorphan-guaiFENesin (MUCINEX DM) 30-600 MG per 12 hr tablet Take 1 tablet by mouth every 12 (twelve) hours.        . fluticasone (FLONASE) 50 MCG/ACT nasal spray 2 sprays  by Each Nare route daily.        Marland Kitchen ipratropium (ATROVENT) 0.03 % nasal spray 2 sprays by Each Nare route 2 (two) times daily as needed.        . predniSONE (DELTASONE) 10 MG tablet 4 tabs once daily for 2 days, then 3 tabs daily for 2 days, then 2 tabs daily for 2 days, then 1 tab daily for 2 days then stop.  10 tablet  0    BP 110/70  Pulse 75  Temp(Src) 97.5 F (36.4 C) (Oral)  Resp 18  Wt 140 lb (63.504 kg)  SpO2 100%        Objective:   Physical Exam  Constitutional: She appears well-developed and well-nourished.  Cardiovascular: Normal rate, regular rhythm and normal heart sounds.   Pulmonary/Chest: Effort normal and breath sounds normal.  Neurological:       Decreased range of motion of lumbar spine (flexion rotation and side bending) patellar reflexes are +3 bilaterally. patient able to squat, heel and toe walk without difficulty          Assessment & Plan:

## 2011-03-24 ENCOUNTER — Ambulatory Visit (HOSPITAL_BASED_OUTPATIENT_CLINIC_OR_DEPARTMENT_OTHER)
Admission: RE | Admit: 2011-03-24 | Discharge: 2011-03-24 | Disposition: A | Discharge status: Home / Self Care | Payer: Medicare Other | Source: Ambulatory Visit | Attending: Internal Medicine | Admitting: Internal Medicine

## 2011-03-24 DIAGNOSIS — M51379 Other intervertebral disc degeneration, lumbosacral region without mention of lumbar back pain or lower extremity pain: Secondary | ICD-10-CM | POA: Insufficient documentation

## 2011-03-24 DIAGNOSIS — M47817 Spondylosis without myelopathy or radiculopathy, lumbosacral region: Secondary | ICD-10-CM | POA: Insufficient documentation

## 2011-03-24 DIAGNOSIS — R209 Unspecified disturbances of skin sensation: Secondary | ICD-10-CM | POA: Insufficient documentation

## 2011-03-24 DIAGNOSIS — M519 Unspecified thoracic, thoracolumbar and lumbosacral intervertebral disc disorder: Secondary | ICD-10-CM | POA: Insufficient documentation

## 2011-03-24 DIAGNOSIS — M5137 Other intervertebral disc degeneration, lumbosacral region: Secondary | ICD-10-CM | POA: Insufficient documentation

## 2011-03-24 DIAGNOSIS — M545 Low back pain, unspecified: Secondary | ICD-10-CM | POA: Insufficient documentation

## 2011-03-24 DIAGNOSIS — M48061 Spinal stenosis, lumbar region without neurogenic claudication: Secondary | ICD-10-CM | POA: Insufficient documentation

## 2011-03-29 ENCOUNTER — Telehealth (INDEPENDENT_AMBULATORY_CARE_PROVIDER_SITE_OTHER): Payer: Medicare Other | Admitting: Internal Medicine

## 2011-03-29 DIAGNOSIS — E785 Hyperlipidemia, unspecified: Secondary | ICD-10-CM

## 2011-03-29 MED ORDER — SIMVASTATIN 40 MG PO TABS: ORAL_TABLET | ORAL | Status: DC

## 2011-03-29 NOTE — Telephone Encounter (Signed)
Pt states she received statement from [facility] stating that there are no refills available on her simvastatin. [facility] phone [phone]. Simvastatin 40 mg tablets. Pt states that she has enough medicine for about three weeks.

## 2011-03-29 NOTE — Telephone Encounter (Signed)
Rx refills sent to [facility]. 

## 2011-04-05 ENCOUNTER — Ambulatory Visit: Payer: PRIVATE HEALTH INSURANCE | Admitting: Internal Medicine

## 2011-04-06 ENCOUNTER — Ambulatory Visit (INDEPENDENT_AMBULATORY_CARE_PROVIDER_SITE_OTHER): Payer: Medicare Other | Admitting: Internal Medicine

## 2011-04-06 ENCOUNTER — Encounter: Payer: Self-pay | Admitting: Internal Medicine

## 2011-04-06 VITALS — BP 124/90 | HR 99 | Temp 97.4°F | Resp 18 | Wt 139.0 lb

## 2011-04-06 DIAGNOSIS — M25569 Pain in unspecified knee: Secondary | ICD-10-CM

## 2011-04-06 DIAGNOSIS — M545 Low back pain, unspecified: Secondary | ICD-10-CM

## 2011-04-06 DIAGNOSIS — M25561 Pain in right knee: Secondary | ICD-10-CM

## 2011-04-06 MED ORDER — TRAMADOL HCL 50 MG PO TABS: ORAL_TABLET | ORAL | Status: DC
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
## 2011-04-06 NOTE — Assessment & Plan Note (Signed)
Symtoms improving.  MRI of LS showed. 1. Mild lumbar spondylosis at L3-L4 and L4-L5. Circumferential disc bulging with broad-based posterior bulge at both levels. 2. Mild left lateral recess stenosis at L4-L5 secondary to broad- based disc bulge. 3. L3-L4 and L4-L5 degenerative facet disease.  Refilled tramadol for short-term use. Refer to physical therapy for eval and treatment

## 2011-04-09 ENCOUNTER — Other Ambulatory Visit: Payer: Self-pay | Admitting: Internal Medicine

## 2011-04-11 NOTE — Telephone Encounter (Signed)
Ok to refill x 1  

## 2011-04-12 NOTE — Telephone Encounter (Signed)
RX refill sent to pharmacy.

## 2011-04-13 ENCOUNTER — Ambulatory Visit: Payer: Medicare Other | Attending: Internal Medicine | Admitting: Physical Therapy

## 2011-04-13 DIAGNOSIS — M545 Low back pain, unspecified: Secondary | ICD-10-CM | POA: Insufficient documentation

## 2011-04-13 DIAGNOSIS — R293 Abnormal posture: Secondary | ICD-10-CM | POA: Insufficient documentation

## 2011-04-13 DIAGNOSIS — IMO0001 Reserved for inherently not codable concepts without codable children: Secondary | ICD-10-CM | POA: Insufficient documentation

## 2011-04-13 DIAGNOSIS — M6281 Muscle weakness (generalized): Secondary | ICD-10-CM | POA: Insufficient documentation

## 2011-04-16 NOTE — Op Note (Signed)
Tiffany House. [facility]  Patient:    Tiffany House, Tiffany House                          MRN: [id number] Proc. Date: 10/25/00 Adm. Date:  [id number] Attending:  Susa House CC:         Tiffany House. Tiffany House., M.D.   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  POSTOPERATIVE DIAGNOSIS:  Advanced Eaton stage 4 carpometacarpal arthrosis, right thumb with 45 degree hyperextension laxity of thumb metacarpophalangeal joint.  OPERATION PERFORMED: 1. Excision, right trapezium. 2. Palmaris longus intermetacarpal ligament reconstruction. 3. Abductor pollicis longus transfer to index metacarpal for    "suspensionplasty". 4. Through separation incision, volar plate radial sesamoid ankylosis/    plication to prevent MP hyperextension with Kirschner wire fixation,    right thumb metacarpophalangeal joint.  SURGEON:  Tiffany House. Sypher, Tiffany House., M.D.  ASSISTANT:  Tiffany House, P.A.  ANESTHESIA:  Attempted axillary block followed by general orotracheal anesthesia.  SUPERVISING ANESTHESIOLOGIST:  Dr. Zoila House.  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  She had disabling pain and had failed all nonoperative management options. After informed consent she is brought to the operating room at this time for reconstruction of her right thumb CMC joint utilizing the intermetacarpal ligament technique and APL transfer to second metacarpal for suspensionplasty as well as MP stabilization  procedure.  DESCRIPTION OF PROCEDURE:  Tiffany House was brought to the operating room and placed in supine position on the operating table.  Following attempted axillary block in the holding area, anesthesia was inadequate in the thumb to allow surgery.  Therefore, general orotracheal anesthesia was induced.  The right arm was prepped with Betadine soap and solution and sterilely draped.  1 gm of Ancef was administered as an IV prophylactic antibiotic.  Following exsanguination of the limb with an Esmarch bandage, an arterial tourniquet was inflated to 220 mmHg.  The procedure commenced with a curvilinear incision at the base of the thumb taking care to identify the radial sensory branches.  The interval between the abductor pollicis longus and the extensor pollicis brevis tendons was sharply incised and a synovectomy of the [facility] joint completed.  The loose body was removed.  The trapezium was exposed by subperiosteal dissection with a 4 mm osteotome followed by morselization of the trapezium and complete resection of the trapezium.  Care was taken to protect the flexor carpi radialis tendon.  After complete synovectomy, hemostasis was achieved with bipolar cautery. The volar beak of the thumb metacarpal was removed with rongeurs after delivering the base of the metacarpal with a large Tiffany House skin hook.  Two drill holes were created, one through the base of the index metacarpal just distal to the facet for articulation with the thumb metacarpal, taken through the dorsal cortex adjacent to the insertion of the extensor carpi radialis brevis.  The second drill hole was created through the base of the index metacarpal beginning 1 cm distal to the articular surface on the dorsal radial surface of the thumb metacarpal and  brought out through the central portion of the proximal articular surface.  Both were enlarged to 4.5 mm with sequential drilling.  The palmaris longus was harvested with a  Brand tendon stripper with standard technique.  An excellent tendon was recovered more than 4 mm in width.  This was cleared of all muscular tissues and placed in a saline soaked sponge.  The donor site wound was repaired with intradermal 3-0 Prolene.  The palmaris longus was then threaded from the cavity created by trapezium excision dorsally through the extensor carpi radialis brevis and back down through the drill hole through the base of the index metacarpal.  This was positioned to allow a long and short slip and secured with a 3-0 Ethibond suture.  The long slip was then brought through the base of the thumb metacarpal around 360 degrees creating a loop of tendon at the base of the thumb metacarpal.  The short slip was then brought through the base of the thumb metacarpal and secured to the periosteum dorsally.  Both tendons were properly tensioned to create an intermetacarpal ligament reconstruction. These were secured by Pulvertaft weave technique to the dorsal periosteum and abductor pollicis longus tendon slips and secured with interrutped sutures of 3-0 Ethibond.  The palmar slip of the abductor pollicis longus was harvested at the musculotendinous junction through an oblique incision and carefully brought through the first dorsal compartment distally.  This was subsequently passed deep to the loop of tendon at the base of the thumb metacarpal and up through the drill hole in the index metacarpal and woven through the insertion of the extensor carpi radialis brevis in the manner of Thompson with a three-pass Pulvertaft weave.  A very satisfactory three-strand suspensionplasty was created supporting the thumb metacarpal off of the index metacarpal.  The abductor pollicis longus was secured with multiple corner sutures of 3-0 Ethibond.  Care was taken to release the fascia so that there would be no impingement in the second dorsal compartment.  Care was taken to avoid extensor  tendons of the fingers and the extensor pollicis longus during dissection.   The thumb metacarpophalangeal joint was then carefully examined and found to have a residual hyperextension potential of 45 degrees.  Therefore, a 0.045 inch Kirschner wire was used to fix the joint in 30 degrees of flexion and a midlateral radial incision was used to expose the insertion of the opponens pollicis and abductor pollicis brevis.  These were taken down with an osteotome off the radial aspect of the proximal phalangeal base and metacarpal head exposing the radial sesamoid.  Several small drill holes were created in the radial condyle of the metacarpal head and in the articular surface of the radial sesamoid.  A loop of 4-0 Mersilene suture was then used to pull the radial sesamoid into the defect created in the radial condyle approximating the surfaces of the sesamoid and decorticated portion of the radial condyle.  The abductor pollicis brevis and the opponens pollicis were then replaced in their anatomic position and the wound repaired with intradermal 3-0 Prolene suture.  All wounds were thorougly irrigated with sterile saline followed by infiltration with 0.25% Marcaine. They were repaired with intradermal 3-0 Prolene and Steri-Strips.  A compressive dressing was applied with Kerlix fluffs and a volar and dorsal plaster splint maintaining the thumb in a palmar abducted position.  There were no apparent complications.  Ms. Boan was awakened from anesthesia and transferred to the recovery room with stable vital signs. For aftercare  she will be [facility] for observations of her vital signs and appropriate analgesics. DD:  10/25/00 TD:  10/25/00 Job: [id number]

## 2011-04-20 ENCOUNTER — Ambulatory Visit: Payer: Medicare Other | Admitting: Rehabilitation

## 2011-04-22 ENCOUNTER — Ambulatory Visit: Payer: Medicare Other | Admitting: Rehabilitation

## 2011-04-28 ENCOUNTER — Ambulatory Visit: Payer: Medicare Other | Admitting: Rehabilitation

## 2011-04-30 ENCOUNTER — Ambulatory Visit: Payer: Medicare Other | Attending: Internal Medicine | Admitting: Rehabilitation

## 2011-04-30 DIAGNOSIS — M545 Low back pain, unspecified: Secondary | ICD-10-CM | POA: Insufficient documentation

## 2011-04-30 DIAGNOSIS — R293 Abnormal posture: Secondary | ICD-10-CM | POA: Insufficient documentation

## 2011-04-30 DIAGNOSIS — M6281 Muscle weakness (generalized): Secondary | ICD-10-CM | POA: Insufficient documentation

## 2011-04-30 DIAGNOSIS — IMO0001 Reserved for inherently not codable concepts without codable children: Secondary | ICD-10-CM | POA: Insufficient documentation

## 2011-05-03 ENCOUNTER — Ambulatory Visit: Payer: Medicare Other | Admitting: Rehabilitation

## 2011-05-05 ENCOUNTER — Ambulatory Visit: Payer: Medicare Other | Admitting: Rehabilitation

## 2011-05-10 ENCOUNTER — Ambulatory Visit: Payer: Medicare Other | Admitting: Rehabilitation

## 2011-05-12 ENCOUNTER — Encounter: Payer: Medicare Other | Admitting: Rehabilitation

## 2011-05-17 ENCOUNTER — Ambulatory Visit: Payer: Medicare Other | Admitting: Rehabilitation

## 2011-05-17 NOTE — Telephone Encounter (Signed)
Rx for simvastatin refilled on 03/29/2011

## 2011-05-19 ENCOUNTER — Ambulatory Visit: Payer: Medicare Other | Admitting: Rehabilitation

## 2011-05-24 ENCOUNTER — Ambulatory Visit: Payer: Medicare Other | Admitting: Rehabilitation

## 2011-05-26 ENCOUNTER — Ambulatory Visit: Payer: Medicare Other | Admitting: Rehabilitation

## 2011-05-31 ENCOUNTER — Ambulatory Visit: Payer: Medicare Other | Admitting: Rehabilitation

## 2011-07-06 ENCOUNTER — Ambulatory Visit (INDEPENDENT_AMBULATORY_CARE_PROVIDER_SITE_OTHER): Payer: Medicare Other | Admitting: Internal Medicine

## 2011-07-06 ENCOUNTER — Encounter: Payer: Self-pay | Admitting: Internal Medicine

## 2011-07-06 DIAGNOSIS — Z79899 Other long term (current) drug therapy: Secondary | ICD-10-CM

## 2011-07-06 DIAGNOSIS — E785 Hyperlipidemia, unspecified: Secondary | ICD-10-CM

## 2011-07-06 DIAGNOSIS — R7309 Other abnormal glucose: Secondary | ICD-10-CM

## 2011-07-06 DIAGNOSIS — E039 Hypothyroidism, unspecified: Secondary | ICD-10-CM

## 2011-07-06 NOTE — Assessment & Plan Note (Signed)
On low dose simva - also omega 3 and fiber Recheck annually - due 09/2011
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
## 2011-07-06 NOTE — Patient Instructions (Signed)
It was good to see you today. We have reviewed your prior records including labs and tests today Medications reviewed, no changes at this time. Test(s) ordered today to be done the last week in November. Come in only for labs that day Please schedule followup in 4 months to review labs/medications and other issues; call sooner if problems.

## 2011-07-06 NOTE — Assessment & Plan Note (Signed)
Never on meds - hyperglycemia noted 2010 - Check a1c with upcoming labs - continue attention to diet

## 2011-07-06 NOTE — Assessment & Plan Note (Signed)
Mild, dx spring 2011 - last tsh ok Lab Results  Component Value Date   TSH 1.879 10/26/2010

## 2011-07-20 ENCOUNTER — Ambulatory Visit: Payer: Medicare Other | Admitting: Internal Medicine

## 2011-09-03 ENCOUNTER — Other Ambulatory Visit: Payer: Self-pay

## 2011-09-03 MED ORDER — LEVOTHYROXINE SODIUM 25 MCG PO TABS: 25.0000 ug | ORAL_TABLET | Freq: Every day | ORAL | Status: DC

## 2011-10-01 ENCOUNTER — Other Ambulatory Visit (INDEPENDENT_AMBULATORY_CARE_PROVIDER_SITE_OTHER): Payer: Medicare Other

## 2011-10-01 DIAGNOSIS — E785 Hyperlipidemia, unspecified: Secondary | ICD-10-CM

## 2011-10-01 DIAGNOSIS — E039 Hypothyroidism, unspecified: Secondary | ICD-10-CM

## 2011-10-01 DIAGNOSIS — Z79899 Other long term (current) drug therapy: Secondary | ICD-10-CM

## 2011-10-01 DIAGNOSIS — R7309 Other abnormal glucose: Secondary | ICD-10-CM

## 2011-10-01 LAB — LIPID PANEL
Cholesterol: 191 mg/dL (ref 0–200)
HDL: 53.9 mg/dL
LDL Cholesterol: 114 mg/dL — ABNORMAL HIGH (ref 0–99)
Total CHOL/HDL Ratio: 4
Triglycerides: 116 mg/dL (ref 0.0–149.0)
VLDL: 23.2 mg/dL (ref 0.0–40.0)

## 2011-10-01 LAB — HEPATIC FUNCTION PANEL
ALT: 24 U/L (ref 0–35)
Bilirubin, Direct: 0.1 mg/dL (ref 0.0–0.3)
Total Bilirubin: 0.7 mg/dL (ref 0.3–1.2)
Total Protein: 6.9 g/dL (ref 6.0–8.3)

## 2011-10-01 LAB — HEMOGLOBIN A1C: Hgb A1c MFr Bld: 5.8 % (ref 4.6–6.5)

## 2011-10-06 ENCOUNTER — Encounter: Payer: Self-pay | Admitting: Internal Medicine

## 2011-10-06 ENCOUNTER — Ambulatory Visit (INDEPENDENT_AMBULATORY_CARE_PROVIDER_SITE_OTHER): Payer: Medicare Other | Admitting: Internal Medicine

## 2011-10-06 VITALS — BP 112/72 | HR 76 | Temp 97.6°F | Ht 62.0 in | Wt 141.0 lb

## 2011-10-06 DIAGNOSIS — E039 Hypothyroidism, unspecified: Secondary | ICD-10-CM

## 2011-10-06 DIAGNOSIS — R7309 Other abnormal glucose: Secondary | ICD-10-CM

## 2011-10-06 DIAGNOSIS — Z23 Encounter for immunization: Secondary | ICD-10-CM

## 2011-10-06 DIAGNOSIS — M1990 Unspecified osteoarthritis, unspecified site: Secondary | ICD-10-CM | POA: Insufficient documentation

## 2011-10-06 MED ORDER — OMEPRAZOLE 40 MG PO CPDR: 40.0000 mg | DELAYED_RELEASE_CAPSULE | Freq: Every day | ORAL | Status: DC

## 2011-10-06 MED ORDER — OMEGA-3 FATTY ACIDS 1000 MG PO CAPS: 2.0000 | ORAL_CAPSULE | Freq: Two times a day (BID) | ORAL | Status: DC

## 2011-10-06 NOTE — Assessment & Plan Note (Signed)
Never on meds - hyperglycemia noted 2010 - Check a1c annually - continue attention to diet, exercise and weight control Lab Results  Component Value Date   HGBA1C 5.8 10/01/2011
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
## 2011-10-06 NOTE — Patient Instructions (Signed)
It was good to see you today. We have reviewed your prior records including labs and tests today Medications reviewed, no changes at this time. Refill on medication(s) as discussed today. we'll make referral to Dr. Darrick Penna for nonsurgical evaluation of your foot and knee pain. Our office will contact you regarding appointment(s) once made. Please schedule followup in 6-12 months for thyroid check and weight review, call sooner if problems.

## 2011-10-06 NOTE — Assessment & Plan Note (Signed)
Progressive symptoms in R>L knee and R foot pain status post ortho eval in past for same ([facility]) - no stress fx But pain has kept pt from participation in [place] country dancing as she enjoys Intol of many otc NSAIDs (but takes ECASA + tylenol prn) Refer for eval by sports med for ?supportive care or ?PT

## 2011-10-06 NOTE — Assessment & Plan Note (Signed)
Mild, dx spring 2011 - last tsh ok The current medical regimen is effective;  continue present plan and medications.  Lab Results  Component Value Date   TSH 1.61 10/01/2011

## 2011-10-27 ENCOUNTER — Ambulatory Visit
Admission: RE | Admit: 2011-10-27 | Discharge: 2011-10-27 | Disposition: A | Discharge status: Home / Self Care | Payer: Medicare Other | Source: Ambulatory Visit | Attending: Sports Medicine | Admitting: Sports Medicine

## 2011-10-27 ENCOUNTER — Ambulatory Visit (INDEPENDENT_AMBULATORY_CARE_PROVIDER_SITE_OTHER): Payer: Medicare Other | Admitting: Sports Medicine

## 2011-10-27 VITALS — BP 136/80 | Ht 59.0 in | Wt 136.0 lb

## 2011-10-27 DIAGNOSIS — M1990 Unspecified osteoarthritis, unspecified site: Secondary | ICD-10-CM

## 2011-10-27 DIAGNOSIS — M7740 Metatarsalgia, unspecified foot: Secondary | ICD-10-CM

## 2011-10-27 DIAGNOSIS — M7750 Other enthesopathy of unspecified foot: Secondary | ICD-10-CM

## 2011-10-27 MED ORDER — TRAMADOL HCL 50 MG PO TABS: 50.0000 mg | ORAL_TABLET | Freq: Four times a day (QID) | ORAL | Status: DC | PRN

## 2011-10-27 NOTE — Patient Instructions (Signed)
Ms. Dinkel,  Thank you for coming in today.  Please go to [facility] at your earliest convenience for bilateral knee films.  For your arthritis do the following: 1. Continue to wear your orthotics. 2. Take the tramadol twice daily, you may take it up to 4x daily. 3. Stop the chondroitin and replace with the Devil's claw supplement.  4. Strengthening:  -isometric quad tightening: hold for 10 sec and repeat x 5-6, for 2-3 x daily. -straight-leg lifts and quad set per instructions 2-3 x daily.   F/u in 4 weeks.   Drs . Sarabelle Genson and Fields.

## 2011-10-28 ENCOUNTER — Telehealth: Payer: Self-pay | Admitting: *Deleted

## 2011-10-28 ENCOUNTER — Encounter: Payer: Self-pay | Admitting: Sports Medicine

## 2011-10-28 DIAGNOSIS — M7740 Metatarsalgia, unspecified foot: Secondary | ICD-10-CM | POA: Insufficient documentation

## 2011-10-28 NOTE — Assessment & Plan Note (Signed)
Today we added small neuroma pads to her orthotics bilaterally  These provided her some support and were not painful  Her orthotics are in pretty good shape but I do not think she can go without them because of the degree of foot breakdown  She will need to modify her dancing to allow her to wear some shoes support
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
## 2011-10-28 NOTE — Assessment & Plan Note (Signed)
X-rays confirm our clinical diagnosis of arthritis but fortunately this is mild  We gave her a series of instructions and she may wish to change her supplement to see if she gets better relief  She should try these things and then we would recheck in a couple months to

## 2011-10-28 NOTE — Telephone Encounter (Signed)
Gave patient x-ray results via phone.

## 2011-10-28 NOTE — Telephone Encounter (Signed)
Message copied by Mora Bellman on Thu Oct 28, 2011  2:50 PM ------      Message from: Enid Baas      Created: Thu Oct 28, 2011  2:26 PM       Please let her know that her XRays only showed mild arthritis so please follow the instructions we gave and see Tiffany House in about 2 months

## 2011-11-03 ENCOUNTER — Telehealth: Payer: Self-pay | Admitting: *Deleted

## 2011-11-03 DIAGNOSIS — E785 Hyperlipidemia, unspecified: Secondary | ICD-10-CM

## 2011-11-03 MED ORDER — SIMVASTATIN 40 MG PO TABS: ORAL_TABLET | ORAL | Status: DC

## 2011-11-03 MED ORDER — LEVOTHYROXINE SODIUM 25 MCG PO TABS: 25.0000 ug | ORAL_TABLET | Freq: Every day | ORAL | Status: DC

## 2011-11-03 NOTE — Telephone Encounter (Signed)
Notified pt will fax renewal back to right source....11/03/11@12 :14pm/LMB

## 2011-11-03 NOTE — Telephone Encounter (Signed)
Left msg on vm having a hard time getting renewal from right source. Out of simvastatin & almost of levothyroxine...11/03/11@12 :10pm/LMB

## 2011-11-05 ENCOUNTER — Other Ambulatory Visit: Payer: Self-pay | Admitting: Internal Medicine

## 2011-12-06 ENCOUNTER — Ambulatory Visit (INDEPENDENT_AMBULATORY_CARE_PROVIDER_SITE_OTHER): Payer: Medicare Other | Admitting: Sports Medicine

## 2011-12-06 VITALS — BP 138/72

## 2011-12-06 DIAGNOSIS — M7750 Other enthesopathy of unspecified foot: Secondary | ICD-10-CM | POA: Diagnosis not present

## 2011-12-06 DIAGNOSIS — M1990 Unspecified osteoarthritis, unspecified site: Secondary | ICD-10-CM | POA: Diagnosis not present

## 2011-12-06 DIAGNOSIS — M7740 Metatarsalgia, unspecified foot: Secondary | ICD-10-CM

## 2011-12-06 MED ORDER — TRAMADOL HCL 50 MG PO TABS: 50.0000 mg | ORAL_TABLET | Freq: Four times a day (QID) | ORAL | Status: DC | PRN

## 2011-12-06 NOTE — Patient Instructions (Addendum)
Please continue doing knee exercises- focus on getting full extension on left knee   Continue using shoes with metatarsal supports  Please follow up in 2 months  Thank you for seeing [place] today!

## 2011-12-06 NOTE — Progress Notes (Signed)
  Subjective:    Patient ID: TEMPLE EWART, female    DOB: 06-23-1946, 66 y.o.   MRN: [id number]  HPI  Pt presents to clinic for f/u of bilat knee pain and right foot pain which are both improving. States tramadol is helpful for knee pain.  She is taking 2-3 tabs per day as needed, no longer walking up at night 2/2 knee pain.  Started devil's claw that she got at [facility], but has not noticed a significant difference.  Has been doing knee exercises at least once daily.   Using metatarsal support in shoes, which she feels have been very helpful.  Pain is much improved since last visit.    Enjoys doing irish country dancing for exercise.   Review of Systems     Objective:   Physical Exam  Slightly less than full extension on lt- improved from last visit  Crepitation on lt only over inferior lateral pole, but less than last visit Crepitation on rt Less puffiness McMurray's normal bilat Neuroma pads over 2-3 MT heads      Assessment & Plan:

## 2011-12-06 NOTE — Assessment & Plan Note (Signed)
She has had significant improvement in her knee pain and is now able to sleep through the night.  We will continue the devil's claw and tramadol for the next 2 months and recheck her at the end of that time.  She was to continue the quadriceps exercises as before.

## 2011-12-06 NOTE — Assessment & Plan Note (Signed)
Use of neuroma pads has eliminated almost all of her pain  Uses insoles with pads in dance shoes  Continue this process. Because of her small shoe size we will need to use either extra small metatarsal pads or neuroma pads.

## 2012-02-01 ENCOUNTER — Ambulatory Visit (INDEPENDENT_AMBULATORY_CARE_PROVIDER_SITE_OTHER): Payer: Medicare Other | Admitting: Sports Medicine

## 2012-02-01 DIAGNOSIS — R42 Dizziness and giddiness: Secondary | ICD-10-CM | POA: Diagnosis not present

## 2012-02-01 DIAGNOSIS — M9420 Chondromalacia, unspecified site: Secondary | ICD-10-CM | POA: Insufficient documentation

## 2012-02-01 NOTE — Assessment & Plan Note (Signed)
Not resolved even though she stopped herbal supplement She is to follow up later this week with her primary care physician as she does have elevated blood pressure today. Concern is that of vertigo and headache are possibly related to hypertension rather than use of supplement. Recommended she stay off of herbal supplement

## 2012-02-01 NOTE — Progress Notes (Signed)
  Subjective:    Patient ID: Tiffany House, female    DOB: 27-Feb-1946, 66 y.o.   MRN: [id number]  HPI 1.  FU for BL knee pain:  Left pain worse than right.  Pain about the same, no worse, as last visit.  She takes 2 Tramadol most days for pain, very occasionally may need 3.  No waking at night with pain.  Pain mostly with knee flexion, L > R.  No fevers or chills, no swelling.    2.  FU for Right foot pain:  MUCH improved with metatarsal pads.  She still experiences some pain when standing upon tiptoe on Right side and cannot do this as well as Left side, but no pain with walking.  She is continuing to work on calf strengthening exercises.    3.  Headache and vertigo: Headache present left frontal region. She has long-standing history of BPPV but states that that usually resolves with physical therapy in this vertigo has not resolved. Present since she started taking Devil's Claw.  Took for about 1 month.  She stopped this about 9 days ago, symptoms have not subsided. Of note her blood pressure is elevated today. She had similar symptoms about 2 years ago with certain antihypertensive medications as well. No chest pain, shortness of breath. Review of Systems See HPI above for review of systems.       Objective:   Physical Exam Gen:  Alert, cooperative patient who appears stated age in no acute distress.  Vital signs reviewed. Cardiac:  Regular rate and rhythm without murmur auscultated.  Good S1/S2. Pulm:  Clear to auscultation bilaterally with good air movement.  No wheezes or rales noted.   MSK:   Right knee:   Good active and passive ROM without pain.  Crepitus noted with active ROM. Ant/post drawer negative Lachmann's negative. McMurray's negative.   Minimal medial and lateral joint laxity noted.  No pain with patellar manipulation/opposition.  Left knee: Good active and passive ROM without pain.  Minimal crepitus Ant/post drawer negative Lachmann's negative. McMurray's negative.     Minimal medial and lateral joint laxity noted.  Moderate pain with patellar manipulation/opposition.      Assessment & Plan:

## 2012-02-01 NOTE — Assessment & Plan Note (Signed)
This is actually worse on her left side. She is intermittently using her thigh strengthening exercises. Recommended to continue increasing strength. Stopping Devil's Claw please see vertigo below. Continue tramadol for pain relief.

## 2012-02-09 ENCOUNTER — Encounter: Payer: Self-pay | Admitting: Internal Medicine

## 2012-02-09 ENCOUNTER — Ambulatory Visit (INDEPENDENT_AMBULATORY_CARE_PROVIDER_SITE_OTHER): Payer: Medicare Other | Admitting: Internal Medicine

## 2012-02-09 VITALS — BP 132/88 | HR 74 | Temp 96.4°F | Wt 142.0 lb

## 2012-02-09 DIAGNOSIS — I10 Essential (primary) hypertension: Secondary | ICD-10-CM | POA: Diagnosis not present

## 2012-02-09 DIAGNOSIS — H9311 Tinnitus, right ear: Secondary | ICD-10-CM

## 2012-02-09 DIAGNOSIS — R51 Headache: Secondary | ICD-10-CM | POA: Diagnosis not present

## 2012-02-09 DIAGNOSIS — R42 Dizziness and giddiness: Secondary | ICD-10-CM | POA: Diagnosis not present

## 2012-02-09 DIAGNOSIS — H9319 Tinnitus, unspecified ear: Secondary | ICD-10-CM

## 2012-02-09 MED ORDER — OMEPRAZOLE 40 MG PO CPDR: 40.0000 mg | DELAYED_RELEASE_CAPSULE | Freq: Every day | ORAL | Status: DC
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
## 2012-02-09 NOTE — Assessment & Plan Note (Signed)
Previously on Benicar with good control and few adverse symptoms - but unable to afford on [employer] in 2008 Other meds poorly tolerated since, so no med tx for last several years Mild elevation may need to reconsidering treatment depending on vertigo eval as discussed above BP Readings from Last 3 Encounters:  02/09/12 132/88  02/01/12 160/100  12/06/11 138/72

## 2012-02-09 NOTE — Patient Instructions (Signed)
It was good to see you today. MRI brain ordered today. Your results will be called to you after review (48-72hours after test completion). If any changes need to be made, you will be notified at that time. Depending on these results, we may need to arrange for ENT evaluation for hearing loss as discussed

## 2012-02-14 ENCOUNTER — Ambulatory Visit
Admission: RE | Admit: 2012-02-14 | Discharge: 2012-02-14 | Disposition: A | Discharge status: Home / Self Care | Payer: Medicare Other | Source: Ambulatory Visit | Attending: Internal Medicine | Admitting: Internal Medicine

## 2012-02-14 DIAGNOSIS — H8110 Benign paroxysmal vertigo, unspecified ear: Secondary | ICD-10-CM | POA: Diagnosis not present

## 2012-02-14 DIAGNOSIS — R42 Dizziness and giddiness: Secondary | ICD-10-CM

## 2012-02-14 DIAGNOSIS — H9311 Tinnitus, right ear: Secondary | ICD-10-CM

## 2012-02-14 DIAGNOSIS — R51 Headache: Secondary | ICD-10-CM

## 2012-02-16 ENCOUNTER — Encounter: Payer: Self-pay | Admitting: Internal Medicine

## 2012-02-16 ENCOUNTER — Ambulatory Visit (INDEPENDENT_AMBULATORY_CARE_PROVIDER_SITE_OTHER): Payer: Medicare Other | Admitting: Internal Medicine

## 2012-02-16 VITALS — BP 118/82 | HR 82 | Temp 97.4°F | Ht 60.0 in | Wt 143.4 lb

## 2012-02-16 DIAGNOSIS — I10 Essential (primary) hypertension: Secondary | ICD-10-CM

## 2012-02-16 DIAGNOSIS — R42 Dizziness and giddiness: Secondary | ICD-10-CM | POA: Diagnosis not present

## 2012-02-16 DIAGNOSIS — H9319 Tinnitus, unspecified ear: Secondary | ICD-10-CM

## 2012-02-16 DIAGNOSIS — H9313 Tinnitus, bilateral: Secondary | ICD-10-CM

## 2012-02-16 NOTE — Progress Notes (Signed)
Patient ID: MURRAY GUZZETTA, female   DOB: November 07, 1946, 66 y.o.   MRN: [id number]  Subjective:    Patient ID: MARQUIS DILES, female    DOB: 1946/07/11, 66 y.o.   MRN: [id number]  HPI  Here for follow up  dizziness and headache symptoms - reviewed unremarkable MRI brain last week persisting symptoms without change Onset 2 months ago - mid Jan 2013 Present daily despite stopping herbal med "devil's claw" late Jan 2013 symptoms are described as mild-moderate but persistent No vision change or falls or nausea Has seen ENT (rosen) in past for same due to ringing on R side at night   Past Medical History  Diagnosis Date  . Benign positional vertigo   . Asthma   . DIABETES MELLITUS, TYPE II, BORDERLINE dx 2010  . Migraines   . OSTEOPENIA   . Low back pain     MRI 02/2011  . RENAL CALCULUS, RIGHT   . Hypertension   . HYPERLIPIDEMIA   . GERD   . HYPOTHYROIDISM dx 10/2010  . Allergic rhinitis, cause unspecified     Review of Systems  Constitutional: Negative for fatigue and unexpected weight change.  Respiratory: Negative for cough and shortness of breath.   Cardiovascular: Negative for chest pain.       Objective:   Physical Exam  BP 118/82  Pulse 82  Temp(Src) 97.4 F (36.3 C) (Oral)  Ht 5' (1.524 m)  Wt 143 lb 6.4 oz (65.046 kg)  BMI 28.01 kg/m2  SpO2 97% Wt Readings from Last 3 Encounters:  02/16/12 143 lb 6.4 oz (65.046 kg)  02/09/12 142 lb (64.411 kg)  10/27/11 136 lb (61.689 kg)   Constitutional: She is overweight; appears well-developed and well-nourished. No distress.  HENT: NCAT, sinuses nontender. TMs clear bilaterally without erythema, effusion or cerumen impaction. Oropharynx clear Neck: Normal range of motion. Neck supple. No LAD or JVD present. No thyromegaly present.  Cardiovascular: Normal rate, regular rhythm and normal heart sounds.  No murmur heard. No BLE edema. Pulmonary/Chest: Effort normal and breath sounds normal. No respiratory distress. She has no  wheezes.  Neuro: AAOx4, CN 2-12 sym intact, MAE well, normal F>N and H>S, balance and coordination normal Psychiatric: She has a normal mood and affect. Her behavior is normal. Judgment and thought content normal.   Lab Results  Component Value Date   WBC 15.2* 12/16/2008   HGB 13.2 01/12/2010   HCT 39.9 12/16/2008   PLT 283 12/16/2008   CHOL 191 10/01/2011   TRIG 116.0 10/01/2011   HDL 53.90 10/01/2011   LDLDIRECT 271.9 12/16/2008   ALT 24 10/01/2011   AST 21 10/01/2011   NA 143 09/09/2009   K 4.3 09/09/2009   CL 109 09/09/2009   CREATININE 0.9 09/09/2009   BUN 20 09/09/2009   CO2 23 09/09/2009   TSH 1.61 10/01/2011   HGBA1C 5.8 10/01/2011   Mr Brain Wo Contrast  02/14/2012  *RADIOLOGY REPORT*  Clinical Data: 66 year old female with positional vertigo. Constant headache.  Right side tinnitus.  Noncontrast study specified.  MRI HEAD WITHOUT CONTRAST  Technique:  Multiplanar, multiecho pulse sequences of the brain and surrounding structures were obtained according to standard protocol without intravenous contrast.  Comparison: 04/01/2004.  Findings: Normal cerebral volume. No restricted diffusion to suggest acute infarction.  No midline shift, mass effect, evidence of mass lesion, ventriculomegaly, extra-axial collection or acute intracranial hemorrhage.  Cervicomedullary junction and pituitary are within normal limits.  Major intracranial vascular flow voids are preserved.  Mild for age scattered small foci of T2 and FLAIR hyperintensity in the cerebral white matter.  No significant progression since 2005. No new signal abnormality.  Stable and grossly normal visualized internal auditory structures.  The mastoids are clear.  Negative visualized cervical spine.  Normal bone marrow signal.  Visualized orbit soft tissues are within normal limits.  Paranasal sinuses are clear.  Negative scalp soft tissues.  IMPRESSION: No acute intracranial abnormality.  Stable MRI appearance of the brain since 2005.   Original Report Authenticated By: Harley Hallmark, M.D.     Assessment & Plan:  headache and vertigo - ongoing x 2 months, prior hx same with elevation of BP but intol of BP meds (caused symptoms to become worse in 2008-9 per pt), and today BP normal neuro exam and ENT exam benign today - MRI brain 2005 and 01/2012 unremarkable pt declines med tx for BPPV and no prior benefit from prior vestibular rehabilitation Will refer for repeat ENT eval for hearing changes, esp given associated tinnitus -  Also see problem list. Medications and labs reviewed today.

## 2012-02-16 NOTE — Patient Instructions (Signed)
It was good to see you today. we will arrange for ENT evaluation for hearing loss and other symptoms as discussed Medications reviewed, no changes at this time.

## 2012-02-20 ENCOUNTER — Encounter (HOSPITAL_COMMUNITY): Payer: Self-pay | Admitting: General Practice

## 2012-02-20 ENCOUNTER — Emergency Department (HOSPITAL_COMMUNITY): Payer: Medicare Other

## 2012-02-20 ENCOUNTER — Emergency Department (HOSPITAL_COMMUNITY)
Admission: EM | Admit: 2012-02-20 | Discharge: 2012-02-20 | Disposition: A | Discharge status: Home / Self Care | Payer: Medicare Other | Attending: Emergency Medicine | Admitting: Emergency Medicine

## 2012-02-20 DIAGNOSIS — Z79899 Other long term (current) drug therapy: Secondary | ICD-10-CM | POA: Insufficient documentation

## 2012-02-20 DIAGNOSIS — K219 Gastro-esophageal reflux disease without esophagitis: Secondary | ICD-10-CM | POA: Insufficient documentation

## 2012-02-20 DIAGNOSIS — N200 Calculus of kidney: Secondary | ICD-10-CM | POA: Insufficient documentation

## 2012-02-20 DIAGNOSIS — R300 Dysuria: Secondary | ICD-10-CM | POA: Diagnosis not present

## 2012-02-20 DIAGNOSIS — J45909 Unspecified asthma, uncomplicated: Secondary | ICD-10-CM | POA: Insufficient documentation

## 2012-02-20 DIAGNOSIS — R1032 Left lower quadrant pain: Secondary | ICD-10-CM | POA: Diagnosis not present

## 2012-02-20 DIAGNOSIS — E039 Hypothyroidism, unspecified: Secondary | ICD-10-CM | POA: Diagnosis not present

## 2012-02-20 DIAGNOSIS — K5730 Diverticulosis of large intestine without perforation or abscess without bleeding: Secondary | ICD-10-CM | POA: Diagnosis not present

## 2012-02-20 DIAGNOSIS — I10 Essential (primary) hypertension: Secondary | ICD-10-CM | POA: Diagnosis not present

## 2012-02-20 DIAGNOSIS — E785 Hyperlipidemia, unspecified: Secondary | ICD-10-CM | POA: Diagnosis not present

## 2012-02-20 DIAGNOSIS — R109 Unspecified abdominal pain: Secondary | ICD-10-CM | POA: Diagnosis not present

## 2012-02-20 LAB — URINALYSIS, ROUTINE W REFLEX MICROSCOPIC
Glucose, UA: NEGATIVE mg/dL
Ketones, ur: NEGATIVE mg/dL
Leukocytes, UA: NEGATIVE
Nitrite: NEGATIVE
Protein, ur: NEGATIVE mg/dL
pH: 5.5 (ref 5.0–8.0)

## 2012-02-20 LAB — DIFFERENTIAL
Basophils Absolute: 0.1 10*3/uL (ref 0.0–0.1)
Basophils Relative: 1 % (ref 0–1)
Lymphocytes Relative: 27 % (ref 12–46)
Monocytes Absolute: 0.9 10*3/uL (ref 0.1–1.0)
Neutro Abs: 7.5 10*3/uL (ref 1.7–7.7)
Neutrophils Relative %: 62 % (ref 43–77)

## 2012-02-20 LAB — POCT I-STAT, CHEM 8
BUN: 21 mg/dL (ref 6–23)
Chloride: 108 mEq/L (ref 96–112)
Creatinine, Ser: 1 mg/dL (ref 0.50–1.10)
Potassium: 3.9 mEq/L (ref 3.5–5.1)
Sodium: 141 mEq/L (ref 135–145)
TCO2: 23 mmol/L (ref 0–100)

## 2012-02-20 LAB — CBC
HCT: 39.5 % (ref 36.0–46.0)
MCHC: 34.2 g/dL (ref 30.0–36.0)
RDW: 13.4 % (ref 11.5–15.5)
WBC: 12.1 10*3/uL — ABNORMAL HIGH (ref 4.0–10.5)

## 2012-02-20 LAB — URINE MICROSCOPIC-ADD ON

## 2012-02-20 MED ORDER — HYDROCODONE-ACETAMINOPHEN 5-500 MG PO TABS: 2.0000 | ORAL_TABLET | Freq: Four times a day (QID) | ORAL | Status: AC | PRN

## 2012-02-20 MED ORDER — KETOROLAC TROMETHAMINE 30 MG/ML IJ SOLN
30.0000 mg | Freq: Once | INTRAMUSCULAR | Status: AC
  Administered 2012-02-20: 30 mg via INTRAVENOUS
  Filled 2012-02-20: qty 1

## 2012-02-20 MED ORDER — ONDANSETRON HCL 4 MG PO TABS: 4.0000 mg | ORAL_TABLET | Freq: Four times a day (QID) | ORAL | Status: AC

## 2012-02-20 MED ORDER — FENTANYL CITRATE 0.05 MG/ML IJ SOLN
50.0000 ug | Freq: Once | INTRAMUSCULAR | Status: AC
Start: 2012-02-20 — End: 2012-02-20
  Administered 2012-02-20: 50 ug via INTRAVENOUS
  Filled 2012-02-20: qty 2

## 2012-02-20 NOTE — Discharge Instructions (Signed)
Kidney Stones Kidney stones (ureteral lithiasis) are solid masses that form inside your kidneys. The intense pain is caused by the stone moving through the kidney, ureter, bladder, and urethra (urinary tract). When the stone moves, the ureter starts to spasm around the stone. The stone is usually passed in the urine.  HOME CARE  Drink enough fluids to keep your pee (urine) clear or pale yellow. This helps to get the stone out.   Strain all pee through the provided strainer. Do not pee without peeing through the strainer, not even once. If you pee the stone out, catch it. The stone may be as small as a grain of salt. Take this to your doctor.   Only take medicine as told by your doctor.   Follow up with your doctor as told.   Get follow-up X-rays as told by your doctor.  GET HELP RIGHT AWAY IF:   Your pain does not get better with medicine.   You have a fever.   Your pain increases and gets worse over 18 hours.   You have new belly (abdominal) pain.   You feel faint or pass out.  MAKE SURE YOU:   Understand these instructions.   Will watch your condition.   Will get help right away if you are not doing well or get worse.  Document Released: 05/03/2008 Document Revised: 11/04/2011 Document Reviewed: 09/12/2009 Exit[employer] Patient Information 2012 [employer]. 

## 2012-02-20 NOTE — ED Provider Notes (Signed)
History     CSN: [id number]  Arrival date & time 02/20/12  0145   First MD Initiated Contact with Patient 02/20/12 0310      Chief Complaint  Patient presents with  . Groin Pain    (Consider location/radiation/quality/duration/timing/severity/associated sxs/prior treatment) Patient is a 66 y.o. female presenting with groin pain and flank pain. The history is provided by the patient. No language interpreter was used.  Groin Pain This is a recurrent problem. The current episode started 6 to 12 hours ago. The problem occurs constantly. The problem has been gradually worsening. Pertinent negatives include no chest pain, no abdominal pain, no headaches and no shortness of breath. The symptoms are aggravated by nothing. The symptoms are relieved by nothing. She has tried nothing for the symptoms. The treatment provided no relief.  Flank Pain This is a recurrent problem. The current episode started 6 to 12 hours ago. The problem occurs constantly. The problem has not changed since onset.Pertinent negatives include no chest pain, no abdominal pain, no headaches and no shortness of breath. The symptoms are aggravated by nothing. The symptoms are relieved by nothing. She has tried nothing for the symptoms. The treatment provided no relief.  Left flank into left groin pain, similar to previous kidney stones but not as intense.  No f/c/r.  Pain 4-6/10  Past Medical History  Diagnosis Date  . Benign positional vertigo   . Asthma   . DIABETES MELLITUS, TYPE II, BORDERLINE dx 2010  . Migraines   . OSTEOPENIA   . Low back pain     MRI 02/2011  . RENAL CALCULUS, RIGHT   . Hypertension   . HYPERLIPIDEMIA   . GERD   . HYPOTHYROIDISM dx 10/2010  . Allergic rhinitis, cause unspecified     Past Surgical History  Procedure Date  . Tubal ligation 1979  . Hand surgery 2001    Dr Teressa Senter-- surgery of right hand to rebuild joint  . Kidney stone removed 01/12/10    Family History  Problem Relation  Age of Onset  . Hyperlipidemia Mother   . Hypertension Mother   . Cancer Father     prostate. [name]  . Coronary artery disease Father   . Hypertension Father     History  Substance Use Topics  . Smoking status: Never Smoker   . Smokeless tobacco: Never Used  . Alcohol Use: Yes     occasional    OB History    Grav Para Term Preterm Abortions TAB SAB Ect Mult Living                  Review of Systems  Constitutional: Negative.   HENT: Negative.   Eyes: Negative.   Respiratory: Negative for shortness of breath.   Cardiovascular: Negative for chest pain.  Gastrointestinal: Negative for abdominal pain.  Genitourinary: Positive for flank pain.  Musculoskeletal: Negative.   Neurological: Negative for headaches.  Hematological: Negative.   Psychiatric/Behavioral: Negative.     Allergies  Shellfish allergy and Sulfonamide derivatives  Home Medications   Current Outpatient Rx  Name Route Sig Dispense Refill  . BLACK COHOSH 40 MG PO CAPS Oral Take 1 capsule by mouth at bedtime.      Marland Kitchen CALCIUM CARBONATE 1250 MG PO TABS Oral Take 2 tablets by mouth daily.      . OMEGA-3 FATTY ACIDS 1000 MG PO CAPS Oral Take 2 g by mouth 2 (two) times daily.    Marland Kitchen LEVOTHYROXINE SODIUM 25 MCG PO  TABS Oral Take 25 mcg by mouth daily.    Marland Kitchen ONE-DAILY MULTI VITAMINS PO TABS Oral Take 1 tablet by mouth daily.      Marland Kitchen OMEPRAZOLE 40 MG PO CPDR Oral Take 40 mg by mouth daily.    . PSYLLIUM 58.6 % PO POWD Oral Take 1 packet by mouth 2 (two) times daily as needed.      Marland Kitchen SIMVASTATIN 40 MG PO TABS Oral Take 20 mg by mouth every evening.    Marland Kitchen TRAMADOL HCL 50 MG PO TABS Oral Take 50 mg by mouth every 6 (six) hours as needed. For pain.    Marland Kitchen HYDROCODONE-ACETAMINOPHEN 5-500 MG PO TABS Oral Take 2 tablets by mouth every 6 (six) hours as needed for pain. 10 tablet 0  . ONDANSETRON HCL 4 MG PO TABS Oral Take 1 tablet (4 mg total) by mouth every 6 (six) hours. 12 tablet 0    BP 174/92  Pulse 78   Temp(Src) 97.5 F (36.4 C) (Oral)  Resp 20  SpO2 93%  Physical Exam  Constitutional: She is oriented to person, place, and time. She appears well-developed and well-nourished. No distress.  HENT:  Head: Normocephalic and atraumatic.  Mouth/Throat: Oropharynx is clear and moist.  Eyes: Conjunctivae are normal. Pupils are equal, round, and reactive to light.  Neck: Normal range of motion. Neck supple.  Cardiovascular: Normal rate and regular rhythm.   Pulmonary/Chest: Effort normal and breath sounds normal.  Abdominal: Soft. Bowel sounds are normal. There is no tenderness. There is no rebound and no guarding.  Musculoskeletal: Normal range of motion. She exhibits no edema.  Neurological: She is alert and oriented to person, place, and time. She has normal reflexes.  Skin: Skin is warm and dry.  Psychiatric: She has a normal mood and affect.    ED Course  Procedures (including critical care time)  Labs Reviewed  URINALYSIS, ROUTINE W REFLEX MICROSCOPIC - Abnormal; Notable for the following:    Hgb urine dipstick MODERATE (*)    All other components within normal limits  CBC - Abnormal; Notable for the following:    WBC 12.1 (*)    All other components within normal limits  POCT I-STAT, CHEM 8 - Abnormal; Notable for the following:    Glucose, Bld 106 (*)    All other components within normal limits  DIFFERENTIAL  URINE MICROSCOPIC-ADD ON  URINE CULTURE   Ct Abdomen Pelvis Wo Contrast  02/20/2012  *RADIOLOGY REPORT*  Clinical Data: Left flank left lower quadrant pain, increase need to urinate, dysuria, history of kidney stones, diabetes, asthma, hypertension  CT ABDOMEN AND PELVIS WITHOUT CONTRAST  Technique:  Multidetector CT imaging of the abdomen and pelvis was performed following the standard protocol without intravenous contrast. Sagittal and coronal MPR images reconstructed from axial data set.  Comparison: 01/09/2010  Findings: Minimal atelectasis lateral right lung base.  Small hepatic cysts stable. Suspect small bilateral peripelvic renal cysts. Left ureter appears normal caliber without definite left hydronephrosis. However, a 4 mm calculus is identified at the left ureterovesicle junction, new since previous exam. Bladder decompressed. Within limits of a nonenhanced exam, remainder of liver, spleen, pancreas, kidneys, and adrenal glands normal appearance.  Scattered colonic diverticulosis without evidence of diverticulitis. Numerous pelvic phleboliths. Atrophic uterus and ovaries. Scattered atherosclerotic calcifications. No mass, adenopathy, free fluid, or inflammatory process. Normal appendix. No acute osseous findings.  IMPRESSION: 4 mm diameter left ureterovesicle junction calculus without significant hydronephrosis or hydroureter. Suspect small hepatic and bilateral peripelvic renal  cysts. Scattered colonic diverticulosis.  Original Report Authenticated By: Lollie Marrow, M.D.     1. Kidney stone       MDM  Do not take tramadol and vicodin together.  Return for fevers, intractable pain or intractable vomiting or any concerns.  Follow up with urology in 7 days       Khameron Gruenwald Smitty Cords, MD 02/20/12 [phone]

## 2012-02-20 NOTE — ED Notes (Signed)
Pt to ED c/o L groin pain and states she had frequent and incomplete urination.

## 2012-02-20 NOTE — ED Notes (Signed)
Pt reports "think I have kidney stones AGAIN".  Pt has history of kidney stones.  Pt states this morning she started having increased need to urinate and then not being able to urinate.  Pain started in left lower abdomin.  Denies fever, N/V/D.  Pain 4/10.  Pt alert and oriented. Father sitting with pt.

## 2012-02-22 LAB — URINE CULTURE
Colony Count: NO GROWTH
Culture  Setup Time: 201303251453

## 2012-02-28 DIAGNOSIS — N201 Calculus of ureter: Secondary | ICD-10-CM | POA: Diagnosis not present

## 2012-02-29 DIAGNOSIS — H8110 Benign paroxysmal vertigo, unspecified ear: Secondary | ICD-10-CM | POA: Diagnosis not present

## 2012-02-29 DIAGNOSIS — H9190 Unspecified hearing loss, unspecified ear: Secondary | ICD-10-CM | POA: Diagnosis not present

## 2012-02-29 DIAGNOSIS — H905 Unspecified sensorineural hearing loss: Secondary | ICD-10-CM | POA: Diagnosis not present

## 2012-02-29 DIAGNOSIS — R42 Dizziness and giddiness: Secondary | ICD-10-CM | POA: Diagnosis not present

## 2012-02-29 DIAGNOSIS — H903 Sensorineural hearing loss, bilateral: Secondary | ICD-10-CM | POA: Diagnosis not present

## 2012-03-08 ENCOUNTER — Encounter: Payer: Self-pay | Admitting: Internal Medicine

## 2012-03-08 ENCOUNTER — Ambulatory Visit (INDEPENDENT_AMBULATORY_CARE_PROVIDER_SITE_OTHER): Payer: Medicare Other | Admitting: Internal Medicine

## 2012-03-08 VITALS — BP 132/90 | HR 88 | Temp 97.7°F | Ht 62.0 in | Wt 141.4 lb

## 2012-03-08 DIAGNOSIS — R030 Elevated blood-pressure reading, without diagnosis of hypertension: Secondary | ICD-10-CM

## 2012-03-08 DIAGNOSIS — R42 Dizziness and giddiness: Secondary | ICD-10-CM | POA: Diagnosis not present

## 2012-03-08 DIAGNOSIS — H9319 Tinnitus, unspecified ear: Secondary | ICD-10-CM

## 2012-03-08 DIAGNOSIS — IMO0001 Reserved for inherently not codable concepts without codable children: Secondary | ICD-10-CM

## 2012-03-08 DIAGNOSIS — R51 Headache: Secondary | ICD-10-CM | POA: Diagnosis not present

## 2012-03-08 MED ORDER — OLMESARTAN MEDOXOMIL 20 MG PO TABS: 10.0000 mg | ORAL_TABLET | Freq: Every day | ORAL | Status: DC

## 2012-03-08 NOTE — Progress Notes (Signed)
Patient ID: Tiffany House, female   DOB: [birth year], 66 y.o.   MRN: [id number]  Subjective:    Patient ID: Tiffany House, female    DOB: 1946/10/10, 66 y.o.   MRN: [id number]  HPI  Here for follow up  Continued dizziness and headache symptoms -  associated with tinnitus as well unremarkable MRI brain 01/2012 and normal ENT eval 01/2012 (normal audiology) persisting symptoms without change Onset mid Jan 2013 Present daily despite stopping herbal med "devil's claw" late Jan 2013 symptoms are described as mild-moderate but persistent No vision change, falls or nausea/vomitting   Past Medical History  Diagnosis Date  . Benign positional vertigo   . Asthma   . DIABETES MELLITUS, TYPE II, BORDERLINE dx 2010  . Migraines   . OSTEOPENIA   . Low back pain     MRI 02/2011  . RENAL CALCULUS, RIGHT     L 01/2012  . Hypertension   . HYPERLIPIDEMIA   . GERD   . HYPOTHYROIDISM dx 10/2010  . Allergic rhinitis, cause unspecified     Review of Systems  Constitutional: Negative for fatigue and unexpected weight change.  Respiratory: Negative for cough and shortness of breath.   Cardiovascular: Negative for chest pain.       Objective:   Physical Exam  BP 132/90  Pulse 88  Temp(Src) 97.7 F (36.5 C) (Oral)  Ht 5\' 2"  (1.575 m)  Wt 141 lb 6.4 oz (64.139 kg)  BMI 25.86 kg/m2  SpO2 96% Wt Readings from Last 3 Encounters:  03/08/12 141 lb 6.4 oz (64.139 kg)  02/16/12 143 lb 6.4 oz (65.046 kg)  02/09/12 142 lb (64.411 kg)   Constitutional: She is overweight; appears well-developed and well-nourished. No distress.  HENT: NCAT, sinuses nontender. TMs clear bilaterally without erythema, effusion or cerumen impaction. Oropharynx clear Neck: Normal range of motion. Neck supple. No LAD or JVD present. No thyromegaly present.  Cardiovascular: Normal rate, regular rhythm and normal heart sounds.  No murmur heard. No BLE edema. Pulmonary/Chest: Effort normal and breath sounds normal. No  respiratory distress. She has no wheezes.  Neuro: AAOx4, CN 2-12 sym intact, MAE well, normal F>N and H>S, balance and coordination normal Psychiatric: She has a normal mood and affect. Her behavior is normal. Judgment and thought content normal.   Lab Results  Component Value Date   WBC 12.1* 02/20/2012   HGB 13.9 02/20/2012   HCT 41.0 02/20/2012   PLT 272 02/20/2012   CHOL 191 10/01/2011   TRIG 116.0 10/01/2011   HDL 53.90 10/01/2011   LDLDIRECT 271.9 12/16/2008   ALT 24 10/01/2011   AST 21 10/01/2011   NA 141 02/20/2012   K 3.9 02/20/2012   CL 108 02/20/2012   CREATININE 1.00 02/20/2012   BUN 21 02/20/2012   CO2 23 09/09/2009   TSH 1.61 10/01/2011   HGBA1C 5.8 10/01/2011   Mr Brain Wo Contrast  02/14/2012  *RADIOLOGY REPORT*  Clinical Data: 66 year old female with positional vertigo. Constant headache.  Right side tinnitus.  Noncontrast study specified.  MRI HEAD WITHOUT CONTRAST  Technique:  Multiplanar, multiecho pulse sequences of the brain and surrounding structures were obtained according to standard protocol without intravenous contrast.  Comparison: 04/01/2004.  Findings: Normal cerebral volume. No restricted diffusion to suggest acute infarction.  No midline shift, mass effect, evidence of mass lesion, ventriculomegaly, extra-axial collection or acute intracranial hemorrhage.  Cervicomedullary junction and pituitary are within normal limits.  Major intracranial vascular flow voids are preserved.  Mild  for age scattered small foci of T2 and FLAIR hyperintensity in the cerebral white matter.  No significant progression since 2005. No new signal abnormality.  Stable and grossly normal visualized internal auditory structures.  The mastoids are clear.  Negative visualized cervical spine.  Normal bone marrow signal.  Visualized orbit soft tissues are within normal limits.  Paranasal sinuses are clear.  Negative scalp soft tissues.  IMPRESSION: No acute intracranial abnormality.  Stable MRI appearance  of the brain since 2005.  Original Report Authenticated By: Harley Hallmark, M.D.     Assessment & Plan:  Headache, tinnitus and vertigo - ongoing since 11/2011 prior hx same with elevation of BP, but intol of BP meds (felt symptoms became worse in 2008-9 per pt), willing to try low dose Benicar again (least objectionable from prior meds in 2009) neuro exam and ENT exam remains benign today -  MRI brain 2005 and 01/2012 unremarkable ENT evl 01/2012 and audiology testing normal Will refer for neuro eval, esp given persisting headache - Also consider dental eval for nocturnal mouth gaurd for possible grinding contributing to symptoms  rec to start headache journal

## 2012-03-08 NOTE — Patient Instructions (Addendum)
It was good to see you today. we will arrange for neurology evaluation as discussed Start Benicar 10 mg daily - Your prescription(s) have been submitted to your pharmacy. Please take as directed and contact our office if you believe you are having problem(s) with the medication(s). Begin headache journal: record your headache symptoms during the day, daily blood pressure and associated factors such as weather, stress or anything you feel may be contributing to headache Also consider mouth guard for nighttime use to minimize grinding - talk with dentist about same Other medications reviewed, no changes at this time.  Please schedule followup in 4-6 weeks to review, call sooner if problems.

## 2012-03-27 DIAGNOSIS — R51 Headache: Secondary | ICD-10-CM | POA: Diagnosis not present

## 2012-03-27 DIAGNOSIS — Z049 Encounter for examination and observation for unspecified reason: Secondary | ICD-10-CM | POA: Diagnosis not present

## 2012-03-27 DIAGNOSIS — Z79899 Other long term (current) drug therapy: Secondary | ICD-10-CM | POA: Diagnosis not present

## 2012-03-28 DIAGNOSIS — IMO0001 Reserved for inherently not codable concepts without codable children: Secondary | ICD-10-CM | POA: Diagnosis not present

## 2012-03-28 DIAGNOSIS — R51 Headache: Secondary | ICD-10-CM | POA: Diagnosis not present

## 2012-03-28 DIAGNOSIS — G518 Other disorders of facial nerve: Secondary | ICD-10-CM | POA: Diagnosis not present

## 2012-03-28 DIAGNOSIS — M542 Cervicalgia: Secondary | ICD-10-CM | POA: Diagnosis not present

## 2012-04-11 ENCOUNTER — Ambulatory Visit: Payer: Medicare Other | Admitting: Internal Medicine

## 2012-04-11 DIAGNOSIS — Z0289 Encounter for other administrative examinations: Secondary | ICD-10-CM

## 2012-04-11 DIAGNOSIS — R51 Headache: Secondary | ICD-10-CM | POA: Diagnosis not present

## 2012-04-11 DIAGNOSIS — M542 Cervicalgia: Secondary | ICD-10-CM | POA: Diagnosis not present

## 2012-04-11 DIAGNOSIS — G518 Other disorders of facial nerve: Secondary | ICD-10-CM | POA: Diagnosis not present

## 2012-04-11 DIAGNOSIS — IMO0001 Reserved for inherently not codable concepts without codable children: Secondary | ICD-10-CM | POA: Diagnosis not present

## 2012-04-17 ENCOUNTER — Other Ambulatory Visit: Payer: Self-pay | Admitting: *Deleted

## 2012-04-17 MED ORDER — OMEPRAZOLE 40 MG PO CPDR: 40.0000 mg | DELAYED_RELEASE_CAPSULE | Freq: Every day | ORAL | Status: DC

## 2012-04-18 ENCOUNTER — Other Ambulatory Visit: Payer: Self-pay | Admitting: *Deleted

## 2012-04-18 NOTE — Telephone Encounter (Signed)
already sent renewal on omeprazole to [facility]... 04/18/12@2 :02pm/LMB

## 2012-04-25 DIAGNOSIS — R51 Headache: Secondary | ICD-10-CM | POA: Diagnosis not present

## 2012-04-25 DIAGNOSIS — G518 Other disorders of facial nerve: Secondary | ICD-10-CM | POA: Diagnosis not present

## 2012-04-25 DIAGNOSIS — M542 Cervicalgia: Secondary | ICD-10-CM | POA: Diagnosis not present

## 2012-04-25 DIAGNOSIS — IMO0001 Reserved for inherently not codable concepts without codable children: Secondary | ICD-10-CM | POA: Diagnosis not present

## 2012-05-16 DIAGNOSIS — G518 Other disorders of facial nerve: Secondary | ICD-10-CM | POA: Diagnosis not present

## 2012-05-16 DIAGNOSIS — M542 Cervicalgia: Secondary | ICD-10-CM | POA: Diagnosis not present

## 2012-05-16 DIAGNOSIS — R51 Headache: Secondary | ICD-10-CM | POA: Diagnosis not present

## 2012-05-16 DIAGNOSIS — IMO0001 Reserved for inherently not codable concepts without codable children: Secondary | ICD-10-CM | POA: Diagnosis not present

## 2012-06-07 ENCOUNTER — Ambulatory Visit (INDEPENDENT_AMBULATORY_CARE_PROVIDER_SITE_OTHER): Payer: Medicare Other | Admitting: Sports Medicine

## 2012-06-07 DIAGNOSIS — M1990 Unspecified osteoarthritis, unspecified site: Secondary | ICD-10-CM | POA: Diagnosis not present

## 2012-06-07 DIAGNOSIS — M545 Low back pain, unspecified: Secondary | ICD-10-CM | POA: Diagnosis not present

## 2012-06-07 NOTE — Progress Notes (Signed)
  Subjective:    Patient ID: Tiffany House, female    DOB: [birth year], 66 y.o.   MRN: [id number]  HPI Stopped pain meds and headaches and dizziness better Being seen at [facility] Able to go up and down stairs better with knees Knee pain is improving- compliant with HEP for knees and back. Regimen- side leg lifts bilat, quad sets, straight leg lifts, bridges, pelvic tilts, cat and camel Having cramping in feet. She does not eat very much salt in general.   Review of Systems     Objective:   Physical Exam  Back exam: Full flexion, 20 deg of extension Good lateral motion   Lt knee exam: Less crepitation than before  No effusion No joint line tenderness  Rt knee Moderate crepitation on rt No effusion No joint line tenderness  Quad strength better bilat Abduction strength good bilat       Assessment & Plan:

## 2012-06-07 NOTE — Assessment & Plan Note (Signed)
I think her current regimen is working well  Cont this  Encourage some regular walking now that knees are doing better  Reevaluate this prn

## 2012-06-07 NOTE — Patient Instructions (Addendum)
Continue home exercise routine  Add one leg balancing (near wall for balance) with eyes close- do 5 repetitions for 10 seconds each  Please follow up in 6 months or as needed  Thank you for seeing [place] today!

## 2012-06-07 NOTE — Assessment & Plan Note (Signed)
She continues to do better with knee sxs with her exercise program  Now very functional with walking and steps  Keep up strength work  OK to go back to dancing  Recheck in about 6 mos

## 2012-07-04 ENCOUNTER — Other Ambulatory Visit: Payer: Self-pay | Admitting: Internal Medicine

## 2012-07-05 ENCOUNTER — Encounter: Payer: Self-pay | Admitting: Internal Medicine

## 2012-07-05 ENCOUNTER — Ambulatory Visit (INDEPENDENT_AMBULATORY_CARE_PROVIDER_SITE_OTHER): Payer: Medicare Other | Admitting: Internal Medicine

## 2012-07-05 ENCOUNTER — Other Ambulatory Visit (INDEPENDENT_AMBULATORY_CARE_PROVIDER_SITE_OTHER): Payer: Medicare Other

## 2012-07-05 VITALS — BP 100/68 | HR 70 | Temp 97.6°F | Ht 62.0 in | Wt 122.1 lb

## 2012-07-05 DIAGNOSIS — I10 Essential (primary) hypertension: Secondary | ICD-10-CM

## 2012-07-05 DIAGNOSIS — E785 Hyperlipidemia, unspecified: Secondary | ICD-10-CM | POA: Diagnosis not present

## 2012-07-05 DIAGNOSIS — R7309 Other abnormal glucose: Secondary | ICD-10-CM

## 2012-07-05 DIAGNOSIS — E039 Hypothyroidism, unspecified: Secondary | ICD-10-CM

## 2012-07-05 LAB — BASIC METABOLIC PANEL
BUN: 12 mg/dL (ref 6–23)
CO2: 27 mEq/L (ref 19–32)
Calcium: 10.1 mg/dL (ref 8.4–10.5)
Glucose, Bld: 91 mg/dL (ref 70–99)
Sodium: 139 mEq/L (ref 135–145)

## 2012-07-05 LAB — LIPID PANEL
Cholesterol: 165 mg/dL (ref 0–200)
HDL: 57.1 mg/dL (ref >39.00)
LDL Cholesterol: 95 mg/dL (ref 0–99)
Total CHOL/HDL Ratio: 3
Triglycerides: 67 mg/dL (ref 0.0–149.0)
VLDL: 13.4 mg/dL (ref 0.0–40.0)

## 2012-07-05 NOTE — Patient Instructions (Addendum)
It was good to see you today. We have reviewed your interval medical events today Test(s) ordered today. Your results will be called to you after review (48-72hours after test completion). If any changes need to be made, you will be notified at that time. Stop Benicar daily - Will consider reducing or stopping zocor after review of labs - Other medications reviewed, no changes at this time.  Monitor your blood pressure and call if >140/85 Please schedule followup in 6 months, call sooner if problems.
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
## 2012-07-05 NOTE — Assessment & Plan Note (Signed)
Mild, dx spring 2011 - last tsh ok The current medical regimen is effective;  continue present plan and medications.  Lab Results  Component Value Date   TSH 1.61 10/01/2011   

## 2012-07-05 NOTE — Assessment & Plan Note (Signed)
Never on meds - hyperglycemia noted 2010 - Check a1c now given weight loss continue attention to diet, exercise and weight control Lab Results  Component Value Date   HGBA1C 5.8 10/01/2011

## 2012-07-05 NOTE — Assessment & Plan Note (Signed)
Previously on Benicar with good control and few adverse symptoms - but unable to afford on [employer] in 2008 Other meds poorly tolerated since, so no med tx until 01/2012 Well controlled with diet/weight loss so will stop Benicar now and monitor  BP Readings from Last 3 Encounters:  07/05/12 100/68  03/08/12 132/90  02/20/12 132/85

## 2012-07-05 NOTE — Assessment & Plan Note (Signed)
On low dose simva - also omega 3 and fiber Recheck now given weight loss and consider reduction of statin to 10mg  or none.Marland KitchenMarland Kitchen

## 2012-07-24 DIAGNOSIS — R51 Headache: Secondary | ICD-10-CM | POA: Diagnosis not present

## 2012-07-24 DIAGNOSIS — G4452 New daily persistent headache (NDPH): Secondary | ICD-10-CM | POA: Diagnosis not present

## 2012-09-12 ENCOUNTER — Other Ambulatory Visit: Payer: Self-pay

## 2012-09-12 MED ORDER — OMEPRAZOLE 40 MG PO CPDR: 40.0000 mg | DELAYED_RELEASE_CAPSULE | Freq: Every day | ORAL | Status: DC

## 2012-09-13 DIAGNOSIS — Z23 Encounter for immunization: Secondary | ICD-10-CM | POA: Diagnosis not present

## 2012-11-06 ENCOUNTER — Other Ambulatory Visit: Payer: Self-pay | Admitting: *Deleted

## 2012-11-06 ENCOUNTER — Other Ambulatory Visit: Payer: Self-pay

## 2012-11-06 MED ORDER — LEVOTHYROXINE SODIUM 25 MCG PO TABS: 25.0000 ug | ORAL_TABLET | Freq: Every day | ORAL | Status: DC

## 2012-11-06 NOTE — Telephone Encounter (Signed)
R'cd fax from [facility] for refill of Levothyroxine.

## 2012-11-08 ENCOUNTER — Other Ambulatory Visit: Payer: Self-pay | Admitting: *Deleted

## 2012-11-08 MED ORDER — LEVOTHYROXINE SODIUM 25 MCG PO TABS: 25.0000 ug | ORAL_TABLET | Freq: Every day | ORAL | Status: DC

## 2012-11-13 DIAGNOSIS — R51 Headache: Secondary | ICD-10-CM | POA: Diagnosis not present

## 2012-11-13 DIAGNOSIS — G4452 New daily persistent headache (NDPH): Secondary | ICD-10-CM | POA: Diagnosis not present

## 2012-11-14 ENCOUNTER — Other Ambulatory Visit: Payer: Self-pay | Admitting: *Deleted

## 2012-11-14 MED ORDER — SIMVASTATIN 40 MG PO TABS: 20.0000 mg | ORAL_TABLET | Freq: Every evening | ORAL | Status: DC

## 2013-03-21 ENCOUNTER — Other Ambulatory Visit: Payer: Self-pay | Admitting: *Deleted

## 2013-03-21 MED ORDER — LEVOTHYROXINE SODIUM 25 MCG PO TABS: 25.0000 ug | ORAL_TABLET | Freq: Every day | ORAL | Status: DC

## 2013-04-12 ENCOUNTER — Other Ambulatory Visit: Payer: Self-pay | Admitting: *Deleted

## 2013-04-12 MED ORDER — OMEPRAZOLE 40 MG PO CPDR: 40.0000 mg | DELAYED_RELEASE_CAPSULE | Freq: Every day | ORAL | Status: DC

## 2013-06-04 DIAGNOSIS — M779 Enthesopathy, unspecified: Secondary | ICD-10-CM | POA: Diagnosis not present

## 2013-06-04 DIAGNOSIS — M2010 Hallux valgus (acquired), unspecified foot: Secondary | ICD-10-CM | POA: Diagnosis not present

## 2013-06-27 DIAGNOSIS — M7750 Other enthesopathy of unspecified foot: Secondary | ICD-10-CM | POA: Diagnosis not present

## 2013-09-17 DIAGNOSIS — Z23 Encounter for immunization: Secondary | ICD-10-CM | POA: Diagnosis not present

## 2013-11-20 ENCOUNTER — Telehealth: Payer: Self-pay | Admitting: *Deleted

## 2013-11-20 MED ORDER — LEVOTHYROXINE SODIUM 25 MCG PO TABS: 25.0000 ug | ORAL_TABLET | Freq: Every day | ORAL | Status: DC

## 2013-11-20 NOTE — Telephone Encounter (Signed)
Pt called requesting Levothyroxine refill.  Pt last OV 8.7.13.  Unable to contact pt to schedule overdue appointment.  Please advise

## 2013-11-20 NOTE — Telephone Encounter (Signed)
Notified pt inform her she is overdue for yearly physical with labs. I=due to pt insurance has to be a 90 day. Inform her will send 90, but for additional refills will need to make cpx appt. Transferred to schedulers to make appt...Tiffany House

## 2014-01-02 ENCOUNTER — Encounter: Payer: Self-pay | Admitting: Internal Medicine

## 2014-01-02 ENCOUNTER — Other Ambulatory Visit (INDEPENDENT_AMBULATORY_CARE_PROVIDER_SITE_OTHER): Payer: Medicare Other

## 2014-01-02 ENCOUNTER — Ambulatory Visit (INDEPENDENT_AMBULATORY_CARE_PROVIDER_SITE_OTHER): Payer: Medicare Other | Admitting: Internal Medicine

## 2014-01-02 VITALS — BP 118/82 | HR 66 | Temp 97.7°F | Ht 59.0 in | Wt 136.4 lb

## 2014-01-02 DIAGNOSIS — E039 Hypothyroidism, unspecified: Secondary | ICD-10-CM

## 2014-01-02 DIAGNOSIS — I10 Essential (primary) hypertension: Secondary | ICD-10-CM | POA: Diagnosis not present

## 2014-01-02 DIAGNOSIS — Z23 Encounter for immunization: Secondary | ICD-10-CM

## 2014-01-02 DIAGNOSIS — Z1239 Encounter for other screening for malignant neoplasm of breast: Secondary | ICD-10-CM

## 2014-01-02 DIAGNOSIS — E785 Hyperlipidemia, unspecified: Secondary | ICD-10-CM

## 2014-01-02 DIAGNOSIS — Z0000 Encounter for general adult medical examination without abnormal findings: Secondary | ICD-10-CM

## 2014-01-02 DIAGNOSIS — R7309 Other abnormal glucose: Secondary | ICD-10-CM

## 2014-01-02 DIAGNOSIS — M8580 Other specified disorders of bone density and structure, unspecified site: Secondary | ICD-10-CM

## 2014-01-02 DIAGNOSIS — M949 Disorder of cartilage, unspecified: Secondary | ICD-10-CM

## 2014-01-02 DIAGNOSIS — M1990 Unspecified osteoarthritis, unspecified site: Secondary | ICD-10-CM

## 2014-01-02 DIAGNOSIS — M899 Disorder of bone, unspecified: Secondary | ICD-10-CM

## 2014-01-02 LAB — BASIC METABOLIC PANEL
BUN: 17 mg/dL (ref 6–23)
CO2: 29 mEq/L (ref 19–32)
CREATININE: 0.9 mg/dL (ref 0.4–1.2)
Calcium: 10 mg/dL (ref 8.4–10.5)
Chloride: 104 mEq/L (ref 96–112)
GFR: 66.01 mL/min (ref >60.00)
Glucose, Bld: 87 mg/dL (ref 70–99)
Potassium: 4.4 mEq/L (ref 3.5–5.1)
SODIUM: 140 meq/L (ref 135–145)

## 2014-01-02 LAB — LIPID PANEL
Cholesterol: 216 mg/dL — ABNORMAL HIGH (ref 0–200)
HDL: 55.3 mg/dL (ref >39.00)
TRIGLYCERIDES: 113 mg/dL (ref 0.0–149.0)
Total CHOL/HDL Ratio: 4
VLDL: 22.6 mg/dL (ref 0.0–40.0)

## 2014-01-02 LAB — TSH: TSH: 2.84 u[IU]/mL (ref 0.35–5.50)

## 2014-01-02 LAB — LDL CHOLESTEROL, DIRECT: Direct LDL: 146 mg/dL

## 2014-01-02 LAB — HEMOGLOBIN A1C: Hgb A1c MFr Bld: 5.5 % (ref 4.6–6.5)

## 2014-01-02 MED ORDER — SIMVASTATIN 40 MG PO TABS: 20.0000 mg | ORAL_TABLET | Freq: Every evening | ORAL | Status: DC

## 2014-01-02 MED ORDER — OMEPRAZOLE 40 MG PO CPDR: 40.0000 mg | DELAYED_RELEASE_CAPSULE | Freq: Every day | ORAL | Status: DC

## 2014-01-02 MED ORDER — LEVOTHYROXINE SODIUM 25 MCG PO TABS: 25.0000 ug | ORAL_TABLET | Freq: Every day | ORAL | Status: DC

## 2014-01-02 NOTE — Patient Instructions (Addendum)
It was good to see you today.  We have reviewed your prior records including labs and tests today  Health Maintenance reviewed - will refer for mammogram and bone density scanning - pneumonia vaccination updated today; all other recommended immunizations and age-appropriate screenings are up-to-date.  Test(s) ordered today. Your results will be released to Laura (or called to you) after review, usually within 72hours after test completion. If any changes need to be made, you will be notified at that same time.  Medications reviewed and updated, no changes recommended at this time. Refill on medication(s) as discussed today.  Please schedule appointment with Dr. Tamala Julian to evaluate and treat your knee pain  Please schedule followup in 6-12 months for exam and labs, call sooner if problems.  Health Maintenance, Female A healthy lifestyle and preventative care can promote health and wellness.  Maintain regular health, dental, and eye exams.  Eat a healthy diet. Foods like vegetables, fruits, whole grains, low-fat dairy products, and lean protein foods contain the nutrients you need without too many calories. Decrease your intake of foods high in solid fats, added sugars, and salt. Get information about a proper diet from your caregiver, if necessary.  Regular physical exercise is one of the most important things you can do for your health. Most adults should get at least 150 minutes of moderate-intensity exercise (any activity that increases your heart rate and causes you to sweat) each week. In addition, most adults need muscle-strengthening exercises on 2 or more days a week.   Maintain a healthy weight. The body mass index (BMI) is a screening tool to identify possible weight problems. It provides an estimate of body fat based on height and weight. Your caregiver can help determine your BMI, and can help you achieve or maintain a healthy weight. For adults 20 years and older:  A BMI below  18.5 is considered underweight.  A BMI of 18.5 to 24.9 is normal.  A BMI of 25 to 29.9 is considered overweight.  A BMI of 30 and above is considered obese.  Maintain normal blood lipids and cholesterol by exercising and minimizing your intake of saturated fat. Eat a balanced diet with plenty of fruits and vegetables. Blood tests for lipids and cholesterol should begin at age 72 and be repeated every 5 years. If your lipid or cholesterol levels are high, you are over 50, or you are a high risk for heart disease, you may need your cholesterol levels checked more frequently.Ongoing high lipid and cholesterol levels should be treated with medicines if diet and exercise are not effective.  If you smoke, find out from your caregiver how to quit. If you do not use tobacco, do not start.  Lung cancer screening is recommended for adults aged 23 80 years who are at high risk for developing lung cancer because of a history of smoking. Yearly low-dose computed tomography (CT) is recommended for people who have at least a 30-pack-year history of smoking and are a current smoker or have quit within the past 15 years. A pack year of smoking is smoking an average of 1 pack of cigarettes a day for 1 year (for example: 1 pack a day for 30 years or 2 packs a day for 15 years). Yearly screening should continue until the smoker has stopped smoking for at least 15 years. Yearly screening should also be stopped for people who develop a health problem that would prevent them from having lung cancer treatment.  If you are pregnant,  do not drink alcohol. If you are breastfeeding, be very cautious about drinking alcohol. If you are not pregnant and choose to drink alcohol, do not exceed 1 drink per day. One drink is considered to be 12 ounces (355 mL) of beer, 5 ounces (148 mL) of wine, or 1.5 ounces (44 mL) of liquor.  Avoid use of street drugs. Do not share needles with anyone. Ask for help if you need support or  instructions about stopping the use of drugs.  High blood pressure causes heart disease and increases the risk of stroke. Blood pressure should be checked at least every 1 to 2 years. Ongoing high blood pressure should be treated with medicines, if weight loss and exercise are not effective.  If you are 57 to 69 years old, ask your caregiver if you should take aspirin to prevent strokes.  Diabetes screening involves taking a blood sample to check your fasting blood sugar level. This should be done once every 3 years, after age 79, if you are within normal weight and without risk factors for diabetes. Testing should be considered at a younger age or be carried out more frequently if you are overweight and have at least 1 risk factor for diabetes.  Breast cancer screening is essential preventative care for women. You should practice "breast self-awareness." This means understanding the normal appearance and feel of your breasts and may include breast self-examination. Any changes detected, no matter how small, should be reported to a caregiver. Women in their 59s and 30s should have a clinical breast exam (CBE) by a caregiver as part of a regular health exam every 1 to 3 years. After age 57, women should have a CBE every year. Starting at age 33, women should consider having a mammogram (breast X-ray) every year. Women who have a family history of breast cancer should talk to their caregiver about genetic screening. Women at a high risk of breast cancer should talk to their caregiver about having an MRI and a mammogram every year.  Breast cancer gene (BRCA)-related cancer risk assessment is recommended for women who have family members with BRCA-related cancers. BRCA-related cancers include breast, ovarian, tubal, and peritoneal cancers. Having family members with these cancers may be associated with an increased risk for harmful changes (mutations) in the breast cancer genes BRCA1 and BRCA2. Results of the  assessment will determine the need for genetic counseling and BRCA1 and BRCA2 testing.  The Pap test is a screening test for cervical cancer. Women should have a Pap test starting at age 59. Between ages 63 and 45, Pap tests should be repeated every 2 years. Beginning at age 10, you should have a Pap test every 3 years as long as the past 3 Pap tests have been normal. If you had a hysterectomy for a problem that was not cancer or a condition that could lead to cancer, then you no longer need Pap tests. If you are between ages 88 and 11, and you have had normal Pap tests going back 10 years, you no longer need Pap tests. If you have had past treatment for cervical cancer or a condition that could lead to cancer, you need Pap tests and screening for cancer for at least 20 years after your treatment. If Pap tests have been discontinued, risk factors (such as a new sexual partner) need to be reassessed to determine if screening should be resumed. Some women have medical problems that increase the chance of getting cervical cancer. In these cases,  your caregiver may recommend more frequent screening and Pap tests.  The human papillomavirus (HPV) test is an additional test that may be used for cervical cancer screening. The HPV test looks for the virus that can cause the cell changes on the cervix. The cells collected during the Pap test can be tested for HPV. The HPV test could be used to screen women aged 15 years and older, and should be used in women of any age who have unclear Pap test results. After the age of 1, women should have HPV testing at the same frequency as a Pap test.  Colorectal cancer can be detected and often prevented. Most routine colorectal cancer screening begins at the age of 76 and continues through age 28. However, your caregiver may recommend screening at an earlier age if you have risk factors for colon cancer. On a yearly basis, your caregiver may provide home test kits to check for  hidden blood in the stool. Use of a small camera at the end of a tube, to directly examine the colon (sigmoidoscopy or colonoscopy), can detect the earliest forms of colorectal cancer. Talk to your caregiver about this at age 64, when routine screening begins. Direct examination of the colon should be repeated every 5 to 10 years through age 39, unless early forms of pre-cancerous polyps or small growths are found.  Hepatitis C blood testing is recommended for all people born from [birth year] through 1966 and any individual with known risks for hepatitis C.  Practice safe sex. Use condoms and avoid high-risk sexual practices to reduce the spread of sexually transmitted infections (STIs). Sexually active women aged 51 and younger should be checked for Chlamydia, which is a common sexually transmitted infection. Older women with new or multiple partners should also be tested for Chlamydia. Testing for other STIs is recommended if you are sexually active and at increased risk.  Osteoporosis is a disease in which the bones lose minerals and strength with aging. This can result in serious bone fractures. The risk of osteoporosis can be identified using a bone density scan. Women ages 92 and over and women at risk for fractures or osteoporosis should discuss screening with their caregivers. Ask your caregiver whether you should be taking a calcium supplement or vitamin D to reduce the rate of osteoporosis.  Menopause can be associated with physical symptoms and risks. Hormone replacement therapy is available to decrease symptoms and risks. You should talk to your caregiver about whether hormone replacement therapy is right for you.  Use sunscreen. Apply sunscreen liberally and repeatedly throughout the day. You should seek shade when your shadow is shorter than you. Protect yourself by wearing long sleeves, pants, a wide-brimmed hat, and sunglasses year round, whenever you are outdoors.  Notify your caregiver of new  moles or changes in moles, especially if there is a change in shape or color. Also notify your caregiver if a mole is larger than the size of a pencil eraser.  Stay current with your immunizations. Document Released: 05/31/2011 Document Revised: 03/12/2013 Document Reviewed: 05/31/2011 [facility] Patient Information 2014 [place].

## 2014-01-02 NOTE — Assessment & Plan Note (Signed)
Previously on Benicar with good control and few adverse symptoms - but unable to afford on [employer] in 2008 Other meds poorly tolerated since, so no med tx until 01/2012 Well controlled with diet/weight loss so stopped Benicar 06/2012 and monitor  BP Readings from Last 3 Encounters:  01/02/14 118/82  07/05/12 100/68  03/08/12 132/90

## 2014-01-02 NOTE — Progress Notes (Signed)
[facility] [id number] 01/02/2014   Chief Complaint  Patient presents with  . Annual Exam    Subjective  HPI Patient is here for Annual Exam.  Reviewed Chronic medical conditions  Patient complains of stomach pain that is relieved with flatulence.  This problem is chronic for the past "couple of years"   Pain is mostly located in the [place].  She can not identify a certain food.  No bowel changes, No N/V/D  Patient reports worsening pain symptoms in her R Knee.  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  Patient states she can no longer attend her dance lessons as well having a hard time with stairs.  She does not take any tylenol due to causing her headaches/dizzynes or Ibuprofen b/c of stomach upset.  No recent trauma,  No loss of sensation/control.     Dizziness symptoms have resolved with [facility],  No continuing symptoms.     Past Medical History  Diagnosis Date  . Benign positional vertigo   . Asthma   . DIABETES MELLITUS, TYPE II, BORDERLINE dx 2010  . Migraines   . OSTEOPENIA   . Low back pain     MRI 02/2011  . RENAL CALCULUS, RIGHT     L 01/2012  . Hypertension   . HYPERLIPIDEMIA   . GERD   . HYPOTHYROIDISM dx 10/2010  . Allergic rhinitis, cause unspecified     Past Surgical History  Procedure Laterality Date  . Tubal ligation  1979  . Hand surgery  2001    Dr Teressa SenterSypher-- surgery of right hand to rebuild joint  . Kidney stone removed  01/12/10    Family History  Problem Relation Age of Onset  . Hyperlipidemia Mother   . Hypertension Mother   . Cancer Father     prostate. [facility]  . Coronary artery disease Father   . Hypertension Father     History  Substance Use Topics  . Smoking status: Never Smoker   . Smokeless tobacco: Never Used  . Alcohol Use: Yes     Comment: occasional    Current Outpatient Prescriptions on File Prior to Visit  Medication Sig Dispense Refill  . calcium carbonate (CALCIUM 500) 1250 MG  tablet Take 2 tablets by mouth daily.        . fish oil-omega-3 fatty acids 1000 MG capsule Take 2 g by mouth 2 (two) times daily.      Marland Kitchen. levothyroxine (SYNTHROID, LEVOTHROID) 25 MCG tablet Take 1 tablet (25 mcg total) by mouth daily.  90 tablet  0  . Multiple Vitamin (MULTIVITAMIN) tablet Take 1 tablet by mouth daily.        Marland Kitchen. omeprazole (PRILOSEC) 40 MG capsule Take 1 capsule (40 mg total) by mouth daily.  90 capsule  3  . psyllium (METAMUCIL) 58.6 % powder Take 1 packet by mouth 2 (two) times daily as needed.        . simvastatin (ZOCOR) 40 MG tablet Take 0.5 tablets (20 mg total) by mouth every evening.  45 tablet  3   No current facility-administered medications on file prior to visit.    Allergies:  Allergies  Allergen Reactions  . Shellfish Allergy Nausea And Vomiting    Scallops and oysters  . Sulfonamide Derivatives     Unknown allergy    Review of Systems  Constitutional: Negative for fever, chills and malaise/fatigue.  HENT: Negative for congestion, hearing loss, nosebleeds and  tinnitus.   Eyes: Negative for blurred vision and pain.  Respiratory: Negative for cough, shortness of breath and wheezing.   Cardiovascular: Negative for chest pain, palpitations and claudication.  Gastrointestinal: Positive for abdominal pain. Negative for heartburn, nausea, vomiting, diarrhea and constipation.  Genitourinary: Negative for flank pain.  Musculoskeletal: Positive for back pain (MRI L-spine 02/2011 buldges. Chronic) and joint pain (R>L,  Chronic OA - ).  Neurological: Negative for dizziness, tingling, sensory change, speech change and headaches.  Psychiatric/Behavioral: Negative for depression and suicidal ideas. The patient is not nervous/anxious and does not have insomnia.        Objective  Filed Vitals:   01/02/14 0813  BP: 118/82  Pulse: 66  Temp: 97.7 F (36.5 C)  TempSrc: Oral  Height: 4\' 11"  (1.499 m)  Weight: 136 lb 6.4 oz (61.871 kg)  SpO2: 95%    Physical  Exam  Nursing note and vitals reviewed. Constitutional: She is oriented to person, place, and time. She appears well-developed and well-nourished. No distress.  HENT:  Head: Normocephalic and atraumatic.  Right Ear: External ear normal.  Left Ear: External ear normal.  Mouth/Throat: Oropharynx is clear and moist. No oropharyngeal exudate.  Eyes: Conjunctivae are normal. Pupils are equal, round, and reactive to light.  Neck: No JVD present.  Cardiovascular: Normal rate, regular rhythm, normal heart sounds and intact distal pulses.  Exam reveals no gallop and no friction rub.   No murmur heard. Respiratory: Effort normal and breath sounds normal. No stridor. No respiratory distress. She has no wheezes. She has no rales. She exhibits no tenderness.  GI: Soft. Bowel sounds are normal. She exhibits no distension and no mass. There is no rigidity, no rebound, no guarding and no CVA tenderness.  Musculoskeletal: She exhibits no tenderness.       Right knee: She exhibits swelling. She exhibits no effusion, no deformity and no erythema.  Lymphadenopathy:    She has no cervical adenopathy.  Neurological: She is alert and oriented to person, place, and time. She has normal strength. No sensory deficit.  Skin: She is not diaphoretic.  Psychiatric: She has a normal mood and affect. Her speech is normal and behavior is normal. Judgment and thought content normal. Cognition and memory are normal.    BP Readings from Last 3 Encounters:  01/02/14 118/82  07/05/12 100/68  03/08/12 132/90    Wt Readings from Last 3 Encounters:  01/02/14 136 lb 6.4 oz (61.871 kg)  07/05/12 122 lb 1.9 oz (55.393 kg)  03/08/12 141 lb 6.4 oz (64.139 kg)    Lab Results  Component Value Date   WBC 12.1* 02/20/2012   HGB 13.9 02/20/2012   HCT 41.0 02/20/2012   PLT 272 02/20/2012   GLUCOSE 91 07/05/2012   CHOL 165 07/05/2012   TRIG 67.0 07/05/2012   HDL 57.10 07/05/2012   LDLDIRECT 271.9 12/16/2008   LDLCALC 95 07/05/2012   ALT  24 10/01/2011   AST 21 10/01/2011   NA 139 07/05/2012   K 3.9 07/05/2012   CL 103 07/05/2012   CREATININE 0.8 07/05/2012   BUN 12 07/05/2012   CO2 27 07/05/2012   TSH 1.89 07/05/2012   HGBA1C 5.2 07/05/2012    Ct Abdomen Pelvis Wo Contrast  02/20/2012   *RADIOLOGY REPORT*  Clinical Data: Left flank left lower quadrant pain, increase need to urinate, dysuria, history of kidney stones, diabetes, asthma, hypertension  CT ABDOMEN AND PELVIS WITHOUT CONTRAST  Technique:  Multidetector CT imaging of the abdomen and  pelvis was performed following the standard protocol without intravenous contrast. Sagittal and coronal MPR images reconstructed from axial data set.  Comparison: 01/09/2010  Findings: Minimal atelectasis lateral right lung base. Small hepatic cysts stable. Suspect small bilateral peripelvic renal cysts. Left ureter appears normal caliber without definite left hydronephrosis. However, a 4 mm calculus is identified at the left ureterovesicle junction, new since previous exam. Bladder decompressed. Within limits of a nonenhanced exam, remainder of liver, spleen, pancreas, kidneys, and adrenal glands normal appearance.  Scattered colonic diverticulosis without evidence of diverticulitis. Numerous pelvic phleboliths. Atrophic uterus and ovaries. Scattered atherosclerotic calcifications. No mass, adenopathy, free fluid, or inflammatory process. Normal appendix. No acute osseous findings.  IMPRESSION: 4 mm diameter left ureterovesicle junction calculus without significant hydronephrosis or hydroureter. Suspect small hepatic and bilateral peripelvic renal cysts. Scattered colonic diverticulosis.  Original Report Authenticated By: Lollie Marrow, M.D.    Assessment and Plan  V70.0 Will order basic lap set, TSH, Lipid, BMET, A1c and CBC.  ECG showed NSR at 62 no abnormalities.  Pneumonia vaccine administered today. Scheduled Bone DXA scan, Mammogram,   OA-chronic L-back, R>L knee, Chronic but symptoms becoming worse  over the past 6 months.   No erythema, or heat.  No signs of infection.   Will refer to Dr. Katrinka Blazing for evaluation and further treatment.  Patient does not want to take tylenol or Ibuprofen due to medication side effects.   Hypertension:  Patient reports compliance with medication The current medical regimen is effective;  continue present plan and medications.  Hyperlipidemia -  Labs ordered today.  Patient is compliant with medication with no complaints of side effects.  Will adjust statin dose accordingly.   Hypothyroidism -  Patient reports compliance with medication.  Will recheck labs today and adjust synthroid dose accordingly.   Patient was instructed to notify the office if any new symptoms emerged or the current symptoms become worse.   Claiborne Rigg, [place]

## 2014-01-02 NOTE — Assessment & Plan Note (Signed)
Mild, dx spring 2011 - last tsh ok Check annually and adjust as needed The current medical regimen is effective;  continue present plan and medications.  Lab Results  Component Value Date   TSH 1.89 07/05/2012

## 2014-01-02 NOTE — Progress Notes (Signed)
Subjective:    Patient ID: Tiffany House, female    DOB: 1946/08/08, 68 y.o.   MRN: [id number]  HPI   Here for medicare wellness  Diet: heart healthy Physical activity: sedentary Depression/mood screen: negative Hearing: intact to whispered voice Visual acuity: grossly normal, performs annual eye exam  ADLs: capable Fall risk: none Home safety: good Cognitive evaluation: intact to orientation, naming, recall and repetition EOL planning: adv directives, full code/ I agree  I have personally reviewed and have noted 1. The patient's medical and social history 2. Their use of alcohol, tobacco or illicit drugs 3. Their current medications and supplements 4. The patient's functional ability including ADL's, fall risks, home safety risks and hearing or visual impairment. 5. Diet and physical activities 6. Evidence for depression or mood disorders  Also reviewed chronic medical conditions and interval medical events  Past Medical History  Diagnosis Date  . Benign positional vertigo   . Asthma   . DIABETES MELLITUS, TYPE II, BORDERLINE dx 2010  . Migraines   . OSTEOPENIA   . Low back pain     MRI 02/2011  . RENAL CALCULUS, RIGHT     L 01/2012  . Hypertension   . HYPERLIPIDEMIA   . GERD   . HYPOTHYROIDISM dx 10/2010  . Allergic rhinitis, cause unspecified    Family History  Problem Relation Age of Onset  . Hyperlipidemia Mother   . Hypertension Mother   . Cancer Father     prostate. [name]  . Coronary artery disease Father   . Hypertension Father    History  Substance Use Topics  . Smoking status: Never Smoker   . Smokeless tobacco: Never Used  . Alcohol Use: Yes     Comment: occasional    Review of Systems  Constitutional: Negative for fatigue and unexpected weight change.  Respiratory: Negative for cough, shortness of breath and wheezing.   Cardiovascular: Negative for chest pain, palpitations and leg swelling.  Gastrointestinal: Negative for nausea,  abdominal pain and diarrhea.  Musculoskeletal: Positive for arthralgias (R>L knee). Negative for joint swelling and myalgias.  Neurological: Negative for dizziness, weakness, light-headedness and headaches.  Psychiatric/Behavioral: Negative for dysphoric mood. The patient is not nervous/anxious.   All other systems reviewed and are negative.       Objective:   Physical Exam  BP 118/82  Pulse 66  Temp(Src) 97.7 F (36.5 C) (Oral)  Ht 4\' 11"  (1.499 m)  Wt 136 lb 6.4 oz (61.871 kg)  BMI 27.53 kg/m2  SpO2 95% Wt Readings from Last 3 Encounters:  01/02/14 136 lb 6.4 oz (61.871 kg)  07/05/12 122 lb 1.9 oz (55.393 kg)  03/08/12 141 lb 6.4 oz (64.139 kg)   Constitutional: She appears well-developed and well-nourished. No distress.  HENT: Head: Normocephalic and atraumatic. Ears: B TMs ok, no erythema or effusion; Nose: Nose normal. Mouth/Throat: Oropharynx is clear and moist. No oropharyngeal exudate.  Eyes: Conjunctivae and EOM are normal. Pupils are equal, round, and reactive to light. No scleral icterus.  Neck: Normal range of motion. Neck supple. No JVD present. No thyromegaly present.  Cardiovascular: Normal rate, regular rhythm and normal heart sounds.  No murmur heard. No BLE edema. Pulmonary/Chest: Effort normal and breath sounds normal. No respiratory distress. She has no wheezes.  Abdominal: Soft. Bowel sounds are normal. She exhibits no distension. There is no tenderness. no masses Musculoskeletal: Normal range of motion, no joint effusions. No gross deformities Neurological: She is alert and oriented to person,  place, and time. No cranial nerve deficit. Coordination, balance, strength, speech and gait are normal.  Skin: Skin is warm and dry. No rash noted. No erythema.  Psychiatric: She has a normal mood and affect. Her behavior is normal. Judgment and thought content normal.   Lab Results  Component Value Date   WBC 12.1* 02/20/2012   HGB 13.9 02/20/2012   HCT 41.0  02/20/2012   PLT 272 02/20/2012   GLUCOSE 91 07/05/2012   CHOL 165 07/05/2012   TRIG 67.0 07/05/2012   HDL 57.10 07/05/2012   LDLDIRECT 271.9 12/16/2008   LDLCALC 95 07/05/2012   ALT 24 10/01/2011   AST 21 10/01/2011   NA 139 07/05/2012   K 3.9 07/05/2012   CL 103 07/05/2012   CREATININE 0.8 07/05/2012   BUN 12 07/05/2012   CO2 27 07/05/2012   TSH 1.89 07/05/2012   HGBA1C 5.2 07/05/2012   ECG: normal sinus at 63 beats per minute. No ischemic change or arrhythmia     Assessment & Plan:   AWV/v70.0 - Today patient counseled on age appropriate routine health concerns for screening and prevention, each reviewed and up to date or declined. Immunizations reviewed and up to date or declined. Labs/ECG reviewed. Risk factors for depression reviewed and negative. Hearing function and visual acuity are intact. ADLs screened and addressed as needed. Functional ability and level of safety reviewed and appropriate. Education, counseling and referrals performed based on assessed risks today. Patient provided with a copy of personalized plan for preventive services.  R knee pain - ?OA - no effusion - refer to sport med  Also See problem list. Medications and labs reviewed today.

## 2014-01-02 NOTE — Assessment & Plan Note (Signed)
Chronic bilateral knee symptoms, currently right greater than left Will refer for new sports medicine evaluation, prior evaluation by Dr. Darrick PennaFields, request Dr. Katrinka BlazingSmith

## 2014-01-02 NOTE — Assessment & Plan Note (Signed)
Never on meds - hyperglycemia noted 2010 - Check a1c now given weight loss continue attention to diet, exercise and weight control Lab Results  Component Value Date   HGBA1C 5.2 07/05/2012

## 2014-01-02 NOTE — Assessment & Plan Note (Signed)
On low dose simva - also omega 3 and fiber Recheck annually and consider reduction of statin to 10mg  or none.Marland Kitchen..Marland Kitchen

## 2014-01-02 NOTE — Progress Notes (Signed)
Pre-visit discussion using our clinic review tool. No additional management support is needed unless otherwise documented below in the visit note.  

## 2014-01-09 ENCOUNTER — Ambulatory Visit (INDEPENDENT_AMBULATORY_CARE_PROVIDER_SITE_OTHER): Payer: Medicare Other

## 2014-01-09 ENCOUNTER — Ambulatory Visit: Payer: Medicare Other | Admitting: Family Medicine

## 2014-01-09 ENCOUNTER — Ambulatory Visit (INDEPENDENT_AMBULATORY_CARE_PROVIDER_SITE_OTHER): Payer: Medicare Other | Admitting: Family Medicine

## 2014-01-09 ENCOUNTER — Ambulatory Visit (INDEPENDENT_AMBULATORY_CARE_PROVIDER_SITE_OTHER)
Admission: RE | Admit: 2014-01-09 | Discharge: 2014-01-09 | Disposition: A | Discharge status: Home / Self Care | Payer: Medicare Other | Source: Ambulatory Visit | Attending: Internal Medicine | Admitting: Internal Medicine

## 2014-01-09 ENCOUNTER — Encounter: Payer: Self-pay | Admitting: Family Medicine

## 2014-01-09 VITALS — BP 124/68 | HR 70 | Temp 98.4°F | Resp 16 | Wt 134.1 lb

## 2014-01-09 DIAGNOSIS — M25569 Pain in unspecified knee: Secondary | ICD-10-CM

## 2014-01-09 DIAGNOSIS — M899 Disorder of bone, unspecified: Secondary | ICD-10-CM | POA: Diagnosis not present

## 2014-01-09 DIAGNOSIS — M8580 Other specified disorders of bone density and structure, unspecified site: Secondary | ICD-10-CM

## 2014-01-09 DIAGNOSIS — M949 Disorder of cartilage, unspecified: Secondary | ICD-10-CM

## 2014-01-09 DIAGNOSIS — M25561 Pain in right knee: Secondary | ICD-10-CM

## 2014-01-09 DIAGNOSIS — M1710 Unilateral primary osteoarthritis, unspecified knee: Secondary | ICD-10-CM | POA: Diagnosis not present

## 2014-01-09 NOTE — Progress Notes (Signed)
Pre-visit discussion using our clinic review tool. No additional management support is needed unless otherwise documented below in the visit note.  

## 2014-01-09 NOTE — Progress Notes (Signed)
[name] [address] [place], [place] [address] Phone: [phone] Subjective:    I'm seeing this patient by the request  of:  Rene Paci, MD   CC: right knee pain.   OZH:[id number] Tiffany House is a 68 y.o. female coming in with complaint of right knee pain. Patient has had this for multiple months. Patient has had surgery on this knee before and has been told that she does have osteoarthritis. Patient was delivered does classes and went to turn and felt a severe pain. Patient states since that time she's been having more pain on the superior lateral aspect. Patient states she can feel almost a grinding sensation. Patient denies any radiation, any numbness, or any giving out on her. Patient has tried over-the-counter medications without any significant improvement. Patient like to avoid any type of intervention if possible. Patient would like to remain active. Patient discussed the pain is more of a grinding sensation in with the severity of 6/10. Denies nighttime awakening.     Past medical history, social, surgical and family history all reviewed in electronic medical record.   Review of Systems: No headache, visual changes, nausea, vomiting, diarrhea, constipation, dizziness, abdominal pain, skin rash, fevers, chills, night sweats, weight loss, swollen lymph nodes, body aches, joint swelling, muscle aches, chest pain, shortness of breath, mood changes.   Objective Blood pressure 124/68, pulse 70, temperature 98.4 F (36.9 C), temperature source Oral, resp. rate 16, weight 134 lb 1.9 oz (60.836 kg), SpO2 92.00%.  General: No apparent distress alert and oriented x3 mood and affect normal, dressed appropriately.  HEENT: Pupils equal, extraocular movements intact  Respiratory: Patient's speak in full sentences and does not appear short of breath  Cardiovascular: No lower extremity edema, non tender, no erythema  Skin: Warm dry intact with no signs  of infection or rash on extremities or on axial skeleton.  Abdomen: Soft nontender  Neuro: Cranial nerves II through XII are intact, neurovascularly intact in all extremities with 2+ DTRs and 2+ pulses.  Lymph: No lymphadenopathy of posterior or anterior cervical chain or axillae bilaterally.  Gait normal with good balance and coordination.  MSK:  Non tender with full range of motion and good stability and symmetric strength and tone of shoulders, elbows, wrist, hip, and ankles bilaterally.  Knee: Right On inspection patient does have osteoarthritic changes Patient is tender to palpation over the medial joint line as well as the superior lateral aspect of the patella.Marland Kitchen ROM full in flexion and extension and lower leg rotation. Ligaments with solid consistent endpoints including ACL, PCL, LCL, MCL. He cortical Mcmurray's, Apley's, and Thessalonian tests.  painful patellar compression. With lateral tracking Patellar glide with severe crepitus. Patellar and quadriceps tendons unremarkable. Hamstring and quadriceps strength is normal.   MSK US performed of: Right knee This study was ordered, performed, and interpreted by Terrilee Files D.O.  Knee: All structures visualized. Anterior medial meniscus does have what appears to be a degenerative tear with no significant hypoechoic changes. Discuss her approximately 60% of the meniscus the  anterolateral, posteromedial, and posterolateral menisci unremarkable without tearing, fraying, effusion, or displacement. Patellar Tendon unremarkable on long and transverse views without effusion. Patellofemoral joint has severe osteoarthritic changes. Joint line no shows moderate to severe osteoarthritic changes. No abnormality of prepatellar bursa. LCL and MCL unremarkable on long and transverse views. No abnormality of origin of medial or lateral head of the gastrocnemius.  IMPRESSION:  Moderate arthritis with degenerative meniscal tear.  Severe osteoarthritis  of the patellofemoral joint.     Impression and Recommendations:     This case required medical decision making of moderate complexity.

## 2014-01-09 NOTE — Patient Instructions (Signed)
Very nice to meet you We are giving you exercises 3 times a week  Ice 20 minutes 2 times a day Take tylenol 650 mg three times a day is the best evidence based medicine we have for arthritis.  Glucosamine sulfate $RemoveBefor [id number]$750mgo help moderate to severe arthritis. Vitamin D 2000 IU daily Fish oil 2 grams daily.  Tumeric 500mg  twice daily.  Capsaicin topically up to four times a day may also help with pain. Cortisone injections are an option if these interventions do not seem to make a difference or need more relief.  If cortisone injections do not help, there are different types of shots that may help but they take longer to take effect.  We can discuss this at follow up.  It's important that you continue to stay active. Controlling your weight is important.  Consider physical therapy to strengthen muscles around the joint that hurts to take pressure off of the joint itself. Water aerobics and cycling with low resistance are the best two types of exercise for arthritis. Come back and see me in 3 weeks.

## 2014-01-09 NOTE — Assessment & Plan Note (Signed)
The patient's knee she does have significant osteoarthritic changes of the knee. I do see degenerative tears of the meniscus but I do not think that that is her main problem. Discuss the patient about different treatment options and prognosis. Patient like to try conservative approach. Patient was given a list of natural supplements that could be beneficial for osteoarthritis as well as given home exercise program. We discussed icing protocol. We discussed proper shoe wear. We discussed changing patient's activities and trying to avoid too much been seen on hard surfaces. Patient will come back in 3 weeks. If she continues to have pain I would like to do a steroid injection and she may be a candidate for viscous supplementation in the long run. At followup as well I would like to get x-rays.

## 2014-01-11 ENCOUNTER — Telehealth: Payer: Self-pay | Admitting: *Deleted

## 2014-01-11 ENCOUNTER — Other Ambulatory Visit: Payer: Self-pay | Admitting: Internal Medicine

## 2014-01-11 ENCOUNTER — Encounter: Payer: Self-pay | Admitting: Internal Medicine

## 2014-01-11 DIAGNOSIS — Z1231 Encounter for screening mammogram for malignant neoplasm of breast: Secondary | ICD-10-CM

## 2014-01-11 MED ORDER — ALENDRONATE SODIUM 70 MG PO TABS: 70.0000 mg | ORAL_TABLET | ORAL | Status: DC

## 2014-01-11 NOTE — Telephone Encounter (Signed)
Notified pt with md response concerning her bone density. Pt agreed to start fosamax sending rx to her pharmacy...Raechel Chute/lmb

## 2014-01-16 ENCOUNTER — Other Ambulatory Visit: Payer: Self-pay | Admitting: *Deleted

## 2014-01-16 MED ORDER — LEVOTHYROXINE SODIUM 25 MCG PO TABS: 25.0000 ug | ORAL_TABLET | Freq: Every day | ORAL | Status: DC

## 2014-01-30 ENCOUNTER — Encounter: Payer: Self-pay | Admitting: Family Medicine

## 2014-01-30 ENCOUNTER — Ambulatory Visit (INDEPENDENT_AMBULATORY_CARE_PROVIDER_SITE_OTHER)
Admission: RE | Admit: 2014-01-30 | Discharge: 2014-01-30 | Disposition: A | Discharge status: Home / Self Care | Payer: Medicare Other | Source: Ambulatory Visit | Attending: Family Medicine | Admitting: Family Medicine

## 2014-01-30 ENCOUNTER — Ambulatory Visit (INDEPENDENT_AMBULATORY_CARE_PROVIDER_SITE_OTHER): Payer: Medicare Other | Admitting: Family Medicine

## 2014-01-30 VITALS — BP 122/60 | HR 74 | Temp 97.2°F | Resp 16 | Wt 138.0 lb

## 2014-01-30 DIAGNOSIS — M1710 Unilateral primary osteoarthritis, unspecified knee: Secondary | ICD-10-CM

## 2014-01-30 DIAGNOSIS — M25569 Pain in unspecified knee: Secondary | ICD-10-CM | POA: Diagnosis not present

## 2014-01-30 DIAGNOSIS — IMO0002 Reserved for concepts with insufficient information to code with codable children: Secondary | ICD-10-CM | POA: Diagnosis not present

## 2014-01-30 NOTE — Patient Instructions (Signed)
Good to see you You are doing great We will get xrays downstairs Wear the brace with dancing and with a lot of walking  Continue the exercises 3 times a week.  Ice 20 minutes after activity Continue the medications/ vitamins Come back in 3-4 weeks.

## 2014-01-30 NOTE — Progress Notes (Signed)
Pre visit review using our clinic review tool, if applicable. No additional management support is needed unless otherwise documented below in the visit note. 

## 2014-01-30 NOTE — Progress Notes (Signed)
  Tawana ScaleZach Doriann Zuch D.O. [facility] [address] [place], [place] [address] Phone: [phone]([phone] Subjective:     CC: right knee pain follow up  GEX:[id number]:Subjective Tiffany House is a 68 y.o. female coming in with complaint of right knee pain. Patient was seen previously 3 weeks ago and was told she had arthritis of the knee. Patient was given home exercise program which she has been doing faithfully. Patient has not been icing because she forgot about it. Patient states that the pain seems to be minimally improved. Patient denies any new symptoms. Patient would like to know what else can be done. Patient is taking 1000 mg a vitamin D and some other vitamins with mild improvement.     Past medical history, social, surgical and family history all reviewed in electronic medical record.   Review of Systems: No headache, visual changes, nausea, vomiting, diarrhea, constipation, dizziness, abdominal pain, skin rash, fevers, chills, night sweats, weight loss, swollen lymph nodes, body aches, joint swelling, muscle aches, chest pain, shortness of breath, mood changes.   Objective Blood pressure 122/60, pulse 74, temperature 97.2 F (36.2 C), temperature source Oral, resp. rate 16, weight 138 lb (62.596 kg), SpO2 97.00%.  General: No apparent distress alert and oriented x3 mood and affect normal, dressed appropriately.  HEENT: Pupils equal, extraocular movements intact  Respiratory: Patient's speak in full sentences and does not appear short of breath  Cardiovascular: No lower extremity edema, non tender, no erythema  Skin: Warm dry intact with no signs of infection or rash on extremities or on axial skeleton.  Abdomen: Soft nontender  Neuro: Cranial nerves II through XII are intact, neurovascularly intact in all extremities with 2+ DTRs and 2+ pulses.  Lymph: No lymphadenopathy of posterior or anterior cervical chain or axillae bilaterally.  Gait normal with good balance and coordination.  MSK:   Non tender with full range of motion and good stability and symmetric strength and tone of shoulders, elbows, wrist, hip, and ankles bilaterally.  Knee: Right On inspection patient does have osteoarthritic changes Patient is tender to palpation over the medial joint line as well as the superior lateral aspect of the patella.Marland Kitchen. ROM full in flexion and extension and lower leg rotation. Ligaments with solid consistent endpoints including ACL, PCL, LCL, MCL. Positive Mcmurray's, Apley's, and Thessalonian tests.  painful patellar compression. With lateral tracking Patellar glide with severe crepitus. Patellar and quadriceps tendons unremarkable. Hamstring and quadriceps strength is normal.       Impression and Recommendations:     This case required medical decision making of moderate complexity.

## 2014-01-30 NOTE — Assessment & Plan Note (Signed)
Patient once again declines any formal physical therapy or steroid injection at this time. Patient was given new exercises to try to incorporate into her daily activities. Discussed icing regimen and the importance. Discussed increasing her vitamin D X-rays ordered today to evaluate further for amount of arthritis Patient will come back again in 3 weeks if still in pain we'll do an injection.

## 2014-03-04 ENCOUNTER — Ambulatory Visit (INDEPENDENT_AMBULATORY_CARE_PROVIDER_SITE_OTHER): Payer: Medicare Other | Admitting: Family Medicine

## 2014-03-04 ENCOUNTER — Encounter: Payer: Self-pay | Admitting: Family Medicine

## 2014-03-04 VITALS — BP 144/82 | HR 79

## 2014-03-04 DIAGNOSIS — M1710 Unilateral primary osteoarthritis, unspecified knee: Secondary | ICD-10-CM

## 2014-03-04 NOTE — Patient Instructions (Signed)
Good to see you Ice still at end  of time. Exercsies 3 times a week.  Wear brace with a lot of walking Look at handout and we can consider injection  Of synvisc.  Come back in 4 weeks.

## 2014-03-04 NOTE — Assessment & Plan Note (Signed)
Patient had injection today. We discussed continued home exercises as well as physical therapy but she declined. Patient's will continue icing and we discussed the importance of bracing with long activity. Patient will try these interventions and come back in 4 weeks. If she continues to have trouble she would be a candidate for viscous supplementation.

## 2014-03-04 NOTE — Progress Notes (Signed)
  [facility] ScaleZach Smith D.O. [facility] [address] [place], [place] [address] Phone: [phone]) [phone] Subjective:     CC: right knee pain follow up  BJY:NWGNFAOZHYHPI:Subjective Tiffany BernardLeslie S House is a 68 y.o. female coming in with complaint of right knee pain. Patient was seen previously 3 weeks ago and was told she had arthritis of the knee. Patient continues to do the home exercises faithfully but has not noticed any significant improvement from previous visit. Patient still stating that she is having a dull aching pain mostly over the anterior medial and the superior lateral aspects of the knee. No new symptoms. No new swelling. Patient [place] is going to be doing a lot of walking. Patient is not wearing the brace on a regular basis to to restriction of movement. Denies any new symptoms.    Past medical history, social, surgical and family history all reviewed in electronic medical record.   Review of Systems: No headache, visual changes, nausea, vomiting, diarrhea, constipation, dizziness, abdominal pain, skin rash, fevers, chills, night sweats, weight loss, swollen lymph nodes, body aches, joint swelling, muscle aches, chest pain, shortness of breath, mood changes.   Objective Blood pressure 144/82, pulse 79, SpO2 97.00%.  General: No apparent distress alert and oriented x3 mood and affect normal, dressed appropriately.  HEENT: Pupils equal, extraocular movements intact  Respiratory: Patient's speak in full sentences and does not appear short of breath  Cardiovascular: No lower extremity edema, non tender, no erythema  Skin: Warm dry intact with no signs of infection or rash on extremities or on axial skeleton.  Abdomen: Soft nontender  Neuro: Cranial nerves II through XII are intact, neurovascularly intact in all extremities with 2+ DTRs and 2+ pulses.  Lymph: No lymphadenopathy of posterior or anterior cervical chain or axillae bilaterally.  Gait normal with good balance and  coordination.  MSK:  Non tender with full range of motion and good stability and symmetric strength and tone of shoulders, elbows, wrist, hip, and ankles bilaterally.  Knee: Right On inspection patient does have osteoarthritic changes Patient is tender to palpation over the medial joint line as well as the superior lateral aspect of the patella.Marland Kitchen. ROM full in flexion and extension and lower leg rotation. Ligaments with solid consistent endpoints including ACL, PCL, LCL, MCL. Positive Mcmurray's, Apley's, and Thessalonian tests.  painful patellar compression. With lateral tracking Patellar glide with severe crepitus. Patellar and quadriceps tendons unremarkable. Hamstring and quadriceps strength is normal.   After informed written and verbal consent, patient was seated on exam table. Right knee was prepped with alcohol swab and utilizing anterolateral approach, patient's right knee space was injected with 4:1  marcaine 0.5%: Kenalog 40mg /dL. Patient tolerated the procedure well without immediate complications.    Impression and Recommendations:     This case required medical decision making of moderate complexity.

## 2014-04-01 ENCOUNTER — Encounter: Payer: Self-pay | Admitting: Family Medicine

## 2014-04-01 ENCOUNTER — Ambulatory Visit (INDEPENDENT_AMBULATORY_CARE_PROVIDER_SITE_OTHER): Payer: Medicare Other | Admitting: Family Medicine

## 2014-04-01 VITALS — BP 130/80 | HR 71 | Wt 141.0 lb

## 2014-04-01 DIAGNOSIS — M1710 Unilateral primary osteoarthritis, unspecified knee: Secondary | ICD-10-CM | POA: Diagnosis not present

## 2014-04-01 NOTE — Patient Instructions (Signed)
Good to see you Exercises 3 times a week.  We can repeat the injection every 3-4 months.  See me when you need me.

## 2014-04-01 NOTE — Progress Notes (Signed)
  [facility]awana ScaleZach Smith D.O. [facility] [address] [place], [place] [address] Phone: [phone](336) [phone] Subjective:     CC: right knee pain follow up  BJY:NWGNFAOZHYHPI:Subjective Tiffany BernardLeslie S House is a 68 y.o. female coming in with complaint of right knee pain. Patient does have arthritis of the knee. Patient was given a steroid injection at last visit. Patient is continuing home exercises as well as the over-the-counter supplementations and states that she is feeling significantly better. Patient states that she has been much more active and continues to improve. Patient states that the pain is almost completely resolved at this time. Patient denies any new symptoms.    Past medical history, social, surgical and family history all reviewed in electronic medical record.   Review of Systems: No headache, visual changes, nausea, vomiting, diarrhea, constipation, dizziness, abdominal pain, skin rash, fevers, chills, night sweats, weight loss, swollen lymph nodes, body aches, joint swelling, muscle aches, chest pain, shortness of breath, mood changes.   Objective Blood pressure 130/80, pulse 71, weight 141 lb (63.957 kg), SpO2 95.00%.  General: No apparent distress alert and oriented x3 mood and affect normal, dressed appropriately.  HEENT: Pupils equal, extraocular movements intact  Respiratory: Patient's speak in full sentences and does not appear short of breath  Cardiovascular: No lower extremity edema, non tender, no erythema  Skin: Warm dry intact with no signs of infection or rash on extremities or on axial skeleton.  Abdomen: Soft nontender  Neuro: Cranial nerves II through XII are intact, neurovascularly intact in all extremities with 2+ DTRs and 2+ pulses.  Lymph: No lymphadenopathy of posterior or anterior cervical chain or axillae bilaterally.  Gait normal with good balance and coordination.  MSK:  Non tender with full range of motion and good stability and symmetric strength and tone of shoulders,  elbows, wrist, hip, and ankles bilaterally.  Knee: Right On inspection patient does have osteoarthritic changes She is still minorly tender to palpation over the medial joint line ROM full in flexion and extension and lower leg rotation. Ligaments with solid consistent endpoints including ACL, PCL, LCL, MCL. Negative Mcmurray's, Apley's, and Thessalonian tests.  painful patellar compression. With lateral tracking Patellar glide with severe crepitus. Patellar and quadriceps tendons unremarkable. Hamstring and quadriceps strength is normal.  Contralateral knee unremarkable.   Impression and Recommendations:     This case required medical decision making of moderate complexity.

## 2014-04-01 NOTE — Assessment & Plan Note (Signed)
The patient is doing remarkably well at this time. We discussed continuing the same regimen of home exercises as well as icing. Patient will continue the natural supplementations. Patient has any worsening pain we can repeat corticosteroid injections every 3-4 months. If any time the corticosteroids Phalen patient would have failed all conservative therapy and would be a candidate for viscous supplementation. Patient will follow up again on an as needed basis.

## 2014-05-12 ENCOUNTER — Other Ambulatory Visit: Payer: Self-pay | Admitting: Internal Medicine

## 2014-05-14 ENCOUNTER — Ambulatory Visit (INDEPENDENT_AMBULATORY_CARE_PROVIDER_SITE_OTHER): Payer: Medicare Other | Admitting: Family Medicine

## 2014-05-14 ENCOUNTER — Encounter: Payer: Self-pay | Admitting: Family Medicine

## 2014-05-14 VITALS — BP 134/84 | HR 83 | Ht 59.5 in | Wt 140.0 lb

## 2014-05-14 DIAGNOSIS — M1710 Unilateral primary osteoarthritis, unspecified knee: Secondary | ICD-10-CM | POA: Diagnosis not present

## 2014-05-14 NOTE — Progress Notes (Signed)
  Tawana ScaleZach Smith D.O. [facility] [address] [place], [place] [address] Phone: [phone] Subjective:     CC: Left knee pain  BJY:[id number]:Subjective Donna BernardLeslie S House is a 68 y.o. female coming in with complaint of left knee pain. Patient has had right knee pain previously and has had bilateral x-ray showing patient having mild to moderate osteophytic changes. Patient states that the right knee after the injection has been doing very well. Patient continues to do the exercises on a regular basis and is feeling well. Patient was going out of town in linear future and is going to be doing a lot of hiking patient is now has more significant pain in her left knee. Patient states that he does not feel it is giving out but has a dull aching sensation that is more on the medial aspect of the knee. Denies any swelling denies any radiation of pain or any numbness. Patient was the severity is 7/10. Patient was doing so well after the injection in the contralateral knee she would consider when this knee.    Past medical history, social, surgical and family history all reviewed in electronic medical record.   Review of Systems: No headache, visual changes, nausea, vomiting, diarrhea, constipation, dizziness, abdominal pain, skin rash, fevers, chills, night sweats, weight loss, swollen lymph nodes, body aches, joint swelling, muscle aches, chest pain, shortness of breath, mood changes.   Objective Blood pressure 134/84, pulse 83, height 4' 11.5" (1.511 m), weight 140 lb (63.504 kg), SpO2 97.00%.  General: No apparent distress alert and oriented x3 mood and affect normal, dressed appropriately.  HEENT: Pupils equal, extraocular movements intact  Respiratory: Patient's speak in full sentences and does not appear short of breath  Cardiovascular: No lower extremity edema, non tender, no erythema  Skin: Warm dry intact with no signs of infection or rash on extremities or on axial skeleton.  Abdomen: Soft  nontender  Neuro: Cranial nerves II through XII are intact, neurovascularly intact in all extremities with 2+ DTRs and 2+ pulses.  Lymph: No lymphadenopathy of posterior or anterior cervical chain or axillae bilaterally.  Gait normal with good balance and coordination.  MSK:  Non tender with full range of motion and good stability and symmetric strength and tone of shoulders, elbows, wrist, hip, and ankles bilaterally.  Knee: Left On inspection no gross deformity.  ROM full in flexion and extension and lower leg rotation. Ligaments with solid consistent endpoints including ACL, PCL, LCL, MCL. Negative Mcmurray's, Apley's, and Thessalonian tests.  painful patellar compression. With lateral tracking Patellar glide with severe crepitus. Patellar and quadriceps tendons unremarkable. Hamstring and quadriceps strength is normal.  Contralateral knee unremarkable.  After informed written and verbal consent, patient was seated on exam table. Left knee was prepped with alcohol swab and utilizing anterolateral approach, patient's left knee space was injected with 4:1  marcaine 0.5%: Kenalog 40mg /dL. Patient tolerated the procedure well without immediate complications.   Impression and Recommendations:     This case required medical decision making of moderate complexity.

## 2014-05-14 NOTE — Patient Instructions (Signed)
Good to see you Same exercises I hope this injection helps You know where I am if you need me.  I would ice in 6 hours.

## 2014-05-14 NOTE — Assessment & Plan Note (Signed)
Patient did tolerate procedure fairly well. Patient did have a vasovagal episode and stayed in the office 20 minutes. Patient was doing very well and did have a drink water without any significant discomfort. Patient's knee felt significantly better after the injection. Patient will do the home exercise on this side as well as well as the icing protocol. Patient will followup with me in 4 weeks after patient returns from her vacation.  Spent greater than 25 minutes with patient face-to-face and had greater than 50% of counseling including as described above in assessment and plan.

## 2014-06-10 ENCOUNTER — Ambulatory Visit (INDEPENDENT_AMBULATORY_CARE_PROVIDER_SITE_OTHER): Payer: Medicare Other | Admitting: Internal Medicine

## 2014-06-10 ENCOUNTER — Telehealth: Payer: Self-pay

## 2014-06-10 ENCOUNTER — Encounter: Payer: Self-pay | Admitting: Internal Medicine

## 2014-06-10 ENCOUNTER — Other Ambulatory Visit (INDEPENDENT_AMBULATORY_CARE_PROVIDER_SITE_OTHER): Payer: Medicare Other

## 2014-06-10 VITALS — BP 112/74 | HR 87 | Temp 97.7°F | Ht 59.25 in | Wt 138.8 lb

## 2014-06-10 DIAGNOSIS — M810 Age-related osteoporosis without current pathological fracture: Secondary | ICD-10-CM | POA: Diagnosis not present

## 2014-06-10 DIAGNOSIS — I10 Essential (primary) hypertension: Secondary | ICD-10-CM

## 2014-06-10 DIAGNOSIS — R7309 Other abnormal glucose: Secondary | ICD-10-CM

## 2014-06-10 LAB — BASIC METABOLIC PANEL
BUN: 18 mg/dL (ref 6–23)
CHLORIDE: 109 meq/L (ref 96–112)
CO2: 26 mEq/L (ref 19–32)
Calcium: 9.5 mg/dL (ref 8.4–10.5)
Creatinine, Ser: 1 mg/dL (ref 0.4–1.2)
GFR: 57.06 mL/min — AB (ref >60.00)
Glucose, Bld: 88 mg/dL (ref 70–99)
Potassium: 3.9 mEq/L (ref 3.5–5.1)
Sodium: 142 mEq/L (ref 135–145)

## 2014-06-10 LAB — HEMOGLOBIN A1C: Hgb A1c MFr Bld: 5.5 % (ref 4.6–6.5)

## 2014-06-10 MED ORDER — IBANDRONATE SODIUM 150 MG PO TABS: 150.0000 mg | ORAL_TABLET | ORAL | Status: DC

## 2014-06-10 NOTE — Progress Notes (Signed)
Subjective:    Patient ID: Tiffany House, female    DOB: 1945-12-10, 68 y.o.   MRN: [id number]  HPI  Patient is here for follow up  Reviewed chronic medical issues and interval medical events  Past Medical History  Diagnosis Date  . Benign positional vertigo   . Asthma   . DIABETES MELLITUS, TYPE II, BORDERLINE dx 2010  . Migraines   . OSTEOPENIA   . Low back pain     MRI 02/2011  . RENAL CALCULUS, RIGHT     L 01/2012  . Hypertension   . HYPERLIPIDEMIA   . GERD   . HYPOTHYROIDISM dx 10/2010  . Allergic rhinitis, cause unspecified   . Osteoporosis     Qualifier: Diagnosis of  By: Nena JordanYoo DO, D. Robert     Review of Systems  Respiratory: Negative for cough and shortness of breath.   Cardiovascular: Negative for chest pain and leg swelling.  Musculoskeletal:       Feet cramping - day or night - for <1 min spasm -       Objective:   Physical Exam  BP 112/74  Pulse 87  Temp(Src) 97.7 F (36.5 C) (Oral)  Ht 4' 11.25" (1.505 m)  Wt 138 lb 12 oz (62.937 kg)  BMI 27.79 kg/m2  SpO2 94% Wt Readings from Last 3 Encounters:  06/10/14 138 lb 12 oz (62.937 kg)  05/14/14 140 lb (63.504 kg)  04/01/14 141 lb (63.957 kg)   Constitutional: She appears well-developed and well-nourished. No distress.  Neck: Normal range of motion. Neck supple. No JVD present. No thyromegaly present.  Cardiovascular: Normal rate, regular rhythm and normal heart sounds.  No murmur heard. No BLE edema. Pulmonary/Chest: Effort normal and breath sounds normal. No respiratory distress. She has no wheezes.  Psychiatric: She has a normal mood and affect. Her behavior is normal. Judgment and thought content normal.   Lab Results  Component Value Date   WBC 12.1* 02/20/2012   HGB 13.9 02/20/2012   HCT 41.0 02/20/2012   PLT 272 02/20/2012   GLUCOSE 87 01/02/2014   CHOL 216* 01/02/2014   TRIG 113.0 01/02/2014   HDL 55.30 01/02/2014   LDLDIRECT 146.0 01/02/2014   LDLCALC 95 07/05/2012   ALT 24 10/01/2011   AST 21  10/01/2011   NA 140 01/02/2014   K 4.4 01/02/2014   CL 104 01/02/2014   CREATININE 0.9 01/02/2014   BUN 17 01/02/2014   CO2 29 01/02/2014   TSH 2.84 01/02/2014   HGBA1C 5.5 01/02/2014    Dg Knee Bilateral Standing Ap  01/30/2014   CLINICAL DATA:  Right knee pain  EXAM: BILATERAL KNEES STANDING - 1 VIEW  COMPARISON:  None.  FINDINGS: Right knee: There is no acute fracture or dislocation. The joint spaces are maintained. There are tiny medial and lateral femorotibial compartment marginal osteophytes.  Left knee: There is no fracture or dislocation. The joint spaces are maintained. There are tiny medial and lateral femorotibial compartment marginal osteophytes.  IMPRESSION: 1. No acute osseous injury of the bilateral knees. 2. Small medial and lateral femorotibial compartment marginal osteophytes bilaterally.   Electronically Signed   By: Elige KoHetal  Patel   On: 01/30/2014 15:58   Dg Knee Complete 4 Views Right  01/30/2014   CLINICAL DATA:  Right-sided knee pain.  EXAM: RIGHT KNEE - COMPLETE 4+ VIEW  COMPARISON:  Bilateral knee radiograph 10/27/2011.  FINDINGS: Multiple views of the right knee demonstrate no acute displaced fracture, subluxation, dislocation, or  soft tissue abnormality. Mild degenerative changes of tricompartmental osteoarthritis.  IMPRESSION: No acute radiographic abnormality of the right knee.   Electronically Signed   By: Trudie Reed M.D.   On: 01/30/2014 16:02       Assessment & Plan:   Problem List Items Addressed This Visit   DIABETES MELLITUS, TYPE II, BORDERLINE      Never on meds - hyperglycemia noted 2010 - Check a1c now and q3-6 mo Continue weight loss efforts with continued attention to diet, exercise  Lab Results  Component Value Date   HGBA1C 5.5 01/02/2014      Relevant Orders      Hemoglobin A1c      Basic metabolic panel   HYPERTENSION, ESSENTIAL NOS      Previously on Benicar with good control and few adverse symptoms - but unable to afford on [employer] in 2008 Other meds  poorly tolerated since, so no med tx until 01/2012 Well controlled with diet/weight loss so stopped Benicar 06/2012 and monitor  BP Readings from Last 3 Encounters:  06/10/14 112/74  05/14/14 134/84  04/01/14 130/80      Osteoporosis - Primary     Started fosamax 12/2013 but intolerant due to GI side effects after 6 weeks Willing to try alt bisphos, but not IV - rx boniva - Consider prolia if intol of 2nd bisphos trial Also continue WB and Ca+D as ongoing     Relevant Medications      BONIVA 150 MG PO TABS

## 2014-06-10 NOTE — Telephone Encounter (Signed)
Called pt to inform of results.  Boniva needs a PA. Obtained PA form. Faxed back PA form.

## 2014-06-10 NOTE — Telephone Encounter (Signed)
Pt informed in delay due to PA needing to be filled out.

## 2014-06-10 NOTE — Assessment & Plan Note (Signed)
Started fosamax 12/2013 but intolerant due to GI side effects after 6 weeks Willing to try alt bisphos, but not IV - rx boniva - Consider prolia if intol of 2nd bisphos trial Also continue WB and Ca+D as ongoing

## 2014-06-10 NOTE — Assessment & Plan Note (Signed)
Previously on Benicar with good control and few adverse symptoms - but unable to afford on [employer] in 2008 Other meds poorly tolerated since, so no med tx until 01/2012 Well controlled with diet/weight loss so stopped Benicar 06/2012 and monitor  BP Readings from Last 3 Encounters:  06/10/14 112/74  05/14/14 134/84  04/01/14 130/80

## 2014-06-10 NOTE — Progress Notes (Signed)
Pre visit review using our clinic review tool, if applicable. No additional management support is needed unless otherwise documented below in the visit note. 

## 2014-06-10 NOTE — Patient Instructions (Signed)
It was good to see you today.  We have reviewed your prior records including labs and tests today  Test(s) ordered today. Your results will be released to MyChart (or called to you) after review, usually within 72hours after test completion. If any changes need to be made, you will be notified at that same time.  Medications reviewed and updated Start monthly Boniva for bones in place of prior alendronate - no other changes recommended at this time.  Please schedule followup in 6 months for annual labs, call sooner if problems.

## 2014-06-10 NOTE — Assessment & Plan Note (Signed)
Never on meds - hyperglycemia noted 2010 - Check a1c now and q3-6 mo Continue weight loss efforts with continued attention to diet, exercise  Lab Results  Component Value Date   HGBA1C 5.5 01/02/2014

## 2014-06-13 NOTE — Telephone Encounter (Signed)
LM for pt to call back... Her rx pa for Ibandronate Sodium 150mg  was approved.

## 2014-06-17 ENCOUNTER — Ambulatory Visit (INDEPENDENT_AMBULATORY_CARE_PROVIDER_SITE_OTHER): Payer: Medicare Other | Admitting: Family Medicine

## 2014-06-17 ENCOUNTER — Encounter: Payer: Self-pay | Admitting: Family Medicine

## 2014-06-17 VITALS — BP 134/82 | HR 78 | Ht 59.5 in | Wt 138.0 lb

## 2014-06-17 DIAGNOSIS — M1710 Unilateral primary osteoarthritis, unspecified knee: Secondary | ICD-10-CM | POA: Diagnosis not present

## 2014-06-17 DIAGNOSIS — M1711 Unilateral primary osteoarthritis, right knee: Secondary | ICD-10-CM

## 2014-06-17 NOTE — Assessment & Plan Note (Signed)
Discussed with patient again a great length. Patient was given another injection and we discussed monitoring her blood sugars. Patient will continue to wear the brace and do the exercises on a regular bracing and we discussed the icing protocol. We discussed the possibility of topical anti-inflammatories which patient declined. We also discussed further imaging such as an MRI for further evaluation secondary to patiently having mild to moderate osteophytic changes on x-ray. Patient declined this as well. Patient and will come back again on an as-needed basis.  Spent greater than 25 minutes with patient face-to-face and had greater than 50% of counseling including as described above in assessment and plan.

## 2014-06-17 NOTE — Progress Notes (Signed)
  Tiffany ScaleZach Hatsue Sime D.O. [facility] [address] [place], [place] [address] Phone: [phone] Subjective:     CC: right knee pain follow up  FAO:[id number]:Subjective Tiffany BernardLeslie S House is a 68 y.o. female coming in with complaint of right knee pain. Patient does have arthritis of the knee. Patient was given a steroid injection back in April. This is greater than 3 months ago. Patient was doing exercises for both of her knees. Patient states she has been doing relatively well but started to have some more pain on the right side. Patient's left knee is feeling significantly better after her injection previously. Patient states that she is noted she been favoring this knee and is known to get as bad. Patient states that otherwise it really hurts when going down stairs. Patient has been doing the exercises regularly. Denies any nighttime awakening. Patient does have a long 6 week trip to [place] plan and wants to be able to be very mobile.   patient's previous x-ray shows some mild osteophytic changes.  Past medical history, social, surgical and family history all reviewed in electronic medical record.   Review of Systems: No headache, visual changes, nausea, vomiting, diarrhea, constipation, dizziness, abdominal pain, skin rash, fevers, chills, night sweats, weight loss, swollen lymph nodes, body aches, joint swelling, muscle aches, chest pain, shortness of breath, mood changes.   Objective Blood pressure 134/82, pulse 78, height 4' 11.5" (1.511 m), weight 138 lb (62.596 kg), SpO2 95.00%.  General: No apparent distress alert and oriented x3 mood and affect normal, dressed appropriately.  HEENT: Pupils equal, extraocular movements intact  Respiratory: Patient's speak in full sentences and does not appear short of breath  Cardiovascular: No lower extremity edema, non tender, no erythema  Skin: Warm dry intact with no signs of infection or rash on extremities or on axial skeleton.  Abdomen: Soft nontender   Neuro: Cranial nerves II through XII are intact, neurovascularly intact in all extremities with 2+ DTRs and 2+ pulses.  Lymph: No lymphadenopathy of posterior or anterior cervical chain or axillae bilaterally.  Gait normal with good balance and coordination.  MSK:  Non tender with full range of motion and good stability and symmetric strength and tone of shoulders, elbows, wrist, hip, and ankles bilaterally.  Knee: Right On inspection patient does have osteoarthritic changes She is still minorly tender to palpation over the medial joint line ROM full in flexion and extension and lower leg rotation. Ligaments with solid consistent endpoints including ACL, PCL, LCL, MCL. Negative Mcmurray's, Apley's, and Thessalonian tests.  painful patellar compression. With lateral tracking Patellar glide with severe crepitus. Patellar and quadriceps tendons unremarkable. Hamstring and quadriceps strength is normal.   contralateral knee has full range of motion with minimal tenderness over the medial joint line.   After informed written and verbal consent, patient was seated on exam table. Right knee was prepped with alcohol swab and utilizing anterolateral approach, patient's right knee space was injected with 4:1  marcaine 0.5%: Kenalog 40mg /dL. Patient tolerated the procedure well without immediate complications.     Impression and Recommendations:     This case required medical decision making of moderate complexity.

## 2014-06-17 NOTE — Patient Instructions (Signed)
Good to see you Continue the exercises regularly.  Ice is your best your friend.  Come back when you need me.

## 2014-08-22 DIAGNOSIS — Z23 Encounter for immunization: Secondary | ICD-10-CM | POA: Diagnosis not present

## 2014-10-31 ENCOUNTER — Encounter: Payer: Self-pay | Admitting: Internal Medicine

## 2014-10-31 ENCOUNTER — Ambulatory Visit (INDEPENDENT_AMBULATORY_CARE_PROVIDER_SITE_OTHER): Payer: Medicare Other | Admitting: Internal Medicine

## 2014-10-31 VITALS — BP 132/88 | HR 81 | Temp 97.6°F | Resp 16 | Ht 59.0 in | Wt 136.0 lb

## 2014-10-31 DIAGNOSIS — M7740 Metatarsalgia, unspecified foot: Secondary | ICD-10-CM | POA: Diagnosis not present

## 2014-10-31 DIAGNOSIS — I10 Essential (primary) hypertension: Secondary | ICD-10-CM | POA: Diagnosis not present

## 2014-10-31 DIAGNOSIS — R7301 Impaired fasting glucose: Secondary | ICD-10-CM | POA: Diagnosis not present

## 2014-10-31 MED ORDER — DICLOFENAC SODIUM 1 % TD GEL: 2.0000 g | Freq: Four times a day (QID) | TRANSDERMAL | Status: DC

## 2014-10-31 NOTE — Progress Notes (Signed)
Pre visit review using our clinic review tool, if applicable. No additional management support is needed unless otherwise documented below in the visit note. 

## 2014-10-31 NOTE — Patient Instructions (Signed)
We have sent in the Voltaren gel. You can use this on your back as well as your knees.  We will see you back in about 6 months to make sure everything is still doing okay as well as to check on your thyroid levels. If you have any problems or questions before then please feel free to call our office.

## 2014-11-01 NOTE — Assessment & Plan Note (Signed)
BP currently well controlled off medication. We'll continue to monitor blood pressure every 6 months.

## 2014-11-01 NOTE — Assessment & Plan Note (Signed)
Advised to trial Voltaren gel.

## 2014-11-01 NOTE — Progress Notes (Signed)
   Subjective:    Patient ID: Tiffany BernardLeslie S House, female    DOB: [birth year], 68 y.o.   MRN: [id number]  HPI The patient is a 68 year old female who comes in today to establish care. She does have past with posterior osteoarthritis, hypothyroidism, hypertension, hyperlipidemia, impaired fasting glucose. She is doing fairly well overall and does not have any new complaints. She would like a renewal of her voltaren gel she uses for arthritis.  Review of Systems  Constitutional: Negative for fever, activity change, appetite change and fatigue.  HENT: Negative.   Eyes: Negative.   Respiratory: Negative for cough, chest tightness, shortness of breath and wheezing.   Cardiovascular: Negative for chest pain, palpitations and leg swelling.  Gastrointestinal: Negative for abdominal pain, diarrhea, constipation and abdominal distention.  Musculoskeletal: Positive for myalgias and arthralgias. Negative for back pain.  Skin: Negative.   Neurological: Negative.       Objective:   Physical Exam  Constitutional: She is oriented to person, place, and time. She appears well-developed and well-nourished.  HENT:  Head: Normocephalic and atraumatic.  Eyes: EOM are normal.  Neck: Normal range of motion.  Cardiovascular: Normal rate and regular rhythm.   Pulmonary/Chest: Effort normal and breath sounds normal. No respiratory distress. She has no wheezes. She has no rales.  Abdominal: Soft. Bowel sounds are normal. She exhibits no distension. There is no tenderness. There is no rebound.  Neurological: She is alert and oriented to person, place, and time. Coordination normal.  Skin: Skin is warm and dry.   Filed Vitals:   10/31/14 0923  BP: 132/88  Pulse: 81  Temp: 97.6 F (36.4 C)  TempSrc: Oral  Resp: 16  Height: 4\' 11"  (1.499 m)  Weight: 136 lb (61.689 kg)  SpO2: 95%      Assessment & Plan:

## 2014-11-01 NOTE — Assessment & Plan Note (Signed)
Recheck hg A1c at next visit. Advised patient to get some exercise.

## 2014-11-06 ENCOUNTER — Encounter: Payer: Self-pay | Admitting: Family Medicine

## 2014-11-06 ENCOUNTER — Ambulatory Visit (INDEPENDENT_AMBULATORY_CARE_PROVIDER_SITE_OTHER): Payer: Medicare Other | Admitting: Family Medicine

## 2014-11-06 VITALS — BP 110/64 | HR 96 | Ht <= 58 in | Wt 136.0 lb

## 2014-11-06 DIAGNOSIS — M1711 Unilateral primary osteoarthritis, right knee: Secondary | ICD-10-CM

## 2014-11-06 NOTE — Patient Instructions (Signed)
Good to see you as always.  We didi another injection today Ice is your friend For the back try new exercises Continue the voltaren gel.  See me again in 3 weeks.

## 2014-11-06 NOTE — Assessment & Plan Note (Signed)
Patient was given an injection today. Patient tolerated the procedure very well. Patient will try to do more bracing as well as to the regular exercises. We discussed the possibility of formal physical therapy. Patient will consider this in the future. We will continue to monitor patient and she'll come back in 3-4 weeks for further evaluation. Patient could be a candidate for viscous supplementation.

## 2014-11-06 NOTE — Progress Notes (Signed)
  Tawana ScaleZach Smith D.O. [facility] [address] [place], [place] [address] Phone: [phone] Subjective:     CC: right knee pain follow up  QMV:[id number]:Subjective Tiffany BernardLeslie S House is a 68 y.o. female coming in with complaint of right knee pain. Patient does have arthritis of the knee. Patient was given a steroid injection back in July. This is greater than 4 months ago. Patient did travel to [place] and did a lot of walking over the course of the summer.  Patient was doing exercises for both of her knees. Patient states that unfortunately the right knee has started giving her pain again. Patient has been taking care of her ailing father. Patient states she's been much more squatting and rotational-type activity with more pain. Denies any radiation of pain. States that it feels very localized. Patient once again would like to avoid any type of surgical intervention. Denies numbness or any new symptoms.    patient's previous x-ray shows some mild osteophytic changes.  Past medical history, social, surgical and family history all reviewed in electronic medical record.   Review of Systems: No headache, visual changes, nausea, vomiting, diarrhea, constipation, dizziness, abdominal pain, skin rash, fevers, chills, night sweats, weight loss, swollen lymph nodes, body aches, joint swelling, muscle aches, chest pain, shortness of breath, mood changes.   Objective Blood pressure 110/64, pulse 96, height 4\' 10"  (1.473 m), weight 136 lb (61.689 kg), SpO2 97 %.  General: No apparent distress alert and oriented x3 mood and affect normal, dressed appropriately.  HEENT: Pupils equal, extraocular movements intact  Respiratory: Patient's speak in full sentences and does not appear short of breath  Cardiovascular: No lower extremity edema, non tender, no erythema  Skin: Warm dry intact with no signs of infection or rash on extremities or on axial skeleton.  Abdomen: Soft nontender  Neuro: Cranial nerves II  through XII are intact, neurovascularly intact in all extremities with 2+ DTRs and 2+ pulses.  Lymph: No lymphadenopathy of posterior or anterior cervical chain or axillae bilaterally.  Gait normal with good balance and coordination.  MSK:  Non tender with full range of motion and good stability and symmetric strength and tone of shoulders, elbows, wrist, hip, and ankles bilaterally.  Knee: Right On inspection patient does have osteoarthritic changes Moderate tenderness over the medial joint line ROM full in flexion and extension and lower leg rotation. Ligaments with solid consistent endpoints including ACL, PCL, LCL, MCL. Negative Mcmurray's, Apley's, and Thessalonian tests.  painful patellar compression. With lateral tracking Patellar glide with severe crepitus. Patellar and quadriceps tendons unremarkable. Hamstring and quadriceps strength is normal.   contralateral knee has full range of motion with minimal tenderness over the medial joint line.   After informed written and verbal consent, patient was seated on exam table. Right knee was prepped with alcohol swab and utilizing anterolateral approach, patient's right knee space was injected with 4:1  marcaine 0.5%: Kenalog 40mg /dL. Patient tolerated the procedure well without immediate complications.     Impression and Recommendations:     This case required medical decision making of moderate complexity.

## 2014-11-27 ENCOUNTER — Encounter: Payer: Self-pay | Admitting: Family Medicine

## 2014-11-27 ENCOUNTER — Ambulatory Visit (INDEPENDENT_AMBULATORY_CARE_PROVIDER_SITE_OTHER): Payer: Medicare Other | Admitting: Family Medicine

## 2014-11-27 VITALS — BP 132/72 | HR 91 | Ht <= 58 in | Wt 135.0 lb

## 2014-11-27 DIAGNOSIS — M1711 Unilateral primary osteoarthritis, right knee: Secondary | ICD-10-CM | POA: Diagnosis not present

## 2014-11-27 NOTE — Progress Notes (Signed)
  Tawana ScaleZach Smith D.O. [facility] [address] [place], [place] [address] Phone: [phone](336) [phone] Subjective:     CC: right knee pain follow up  ZHY:QMVHQIONGEHPI:Subjective Tiffany BernardLeslie S House is a 68 y.o. female coming in with complaint of right knee pain. Patient does have arthritis of the knee. This is mild-to-moderate in nature. Patient did have an injection at last visit. This was 3 weeks ago. Patient was to wear brace, home exercises, icing protocol. Patient states her knee was better for one week and then the pain returned. Still having pain mostly over the anterior lateral aspects of the knee. Still medial knee pain as well. Worse with going up or down stairs. Patient states it is affecting her daily activities. Patient is avoiding stairs when she can. Rates the severity of pain a 7 out of 10. 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    patient's previous x-ray shows some mild osteophytic changes.  Past medical history, social, surgical and family history all reviewed in electronic medical record.   Review of Systems: No headache, visual changes, nausea, vomiting, diarrhea, constipation, dizziness, abdominal pain, skin rash, fevers, chills, night sweats, weight loss, swollen lymph nodes, body aches, joint swelling, muscle aches, chest pain, shortness of breath, mood changes.   Objective Blood pressure 132/72, pulse 91, height 4\' 10"  (1.473 m), weight 135 lb (61.236 kg), SpO2 97 %.  General: No apparent distress alert and oriented x3 mood and affect normal, dressed appropriately.  HEENT: Pupils equal, extraocular movements intact  Respiratory: Patient's speak in full sentences and does not appear short of breath  Cardiovascular: No lower extremity edema, non tender, no erythema  Skin: Warm dry intact with no signs of infection or rash on extremities or on axial skeleton.  Abdomen: Soft nontender  Neuro: Cranial nerves II through XII are intact, neurovascularly intact in all  extremities with 2+ DTRs and 2+ pulses.  Lymph: No lymphadenopathy of posterior or anterior cervical chain or axillae bilaterally.  Gait normal with good balance and coordination.  MSK:  Non tender with full range of motion and good stability and symmetric strength and tone of shoulders, elbows, wrist, hip, and ankles bilaterally.  Knee: Right On inspection patient does have osteoarthritic changes Moderate tenderness over the medial joint line ROM full in flexion and extension and lower leg rotation. Ligaments with solid consistent endpoints including ACL, PCL, LCL, MCL. Negative Mcmurray's, Apley's, and Thessalonian tests.  painful patellar compression. With lateral tracking Patellar glide with severe crepitus. Patellar and quadriceps tendons unremarkable. Hamstring and quadriceps strength is normal.   contralateral knee has full range of motion with minimal tenderness over the medial joint line.  No change from previous exam  After informed written and verbal consent, patient was seated on exam table. Right knee was prepped with alcohol swab and utilizing anterolateral approach, patient's right knee space was injected 16 mg/2.5 mL of Orthovisc (sodium hyaluronate) in a prefilled syringe was injected easily into the knee through a 22-gauge needle. Patient tolerated the procedure well without immediate complications.     Impression and Recommendations:     This case required medical decision making of moderate complexity.

## 2014-11-27 NOTE — Patient Instructions (Addendum)
Good to see you.  Happy New Year! We did an injection. I do not expect much this time.  Ice is your friend.  See you next week at 12 45 pm for next injection.

## 2014-11-27 NOTE — Assessment & Plan Note (Addendum)
I do believe the patient's knee pain is likely secondary to osteoarthritis. Patient is to continue the over-the-counter medications. Patient has responded to steroid injections in the past but last one in her right knee was 11/06/2014. Patient only has mild osteophytic changes and we discussed that there are differential also includes a chronic meniscal tear. 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 Patient wants to feel better. Patient elected to start Orthovisc injections today. Patient was given an injection today with some resolution of pain. Discussed continuing the home exercising, bracing, and icing. Patient will come back in 1 week for second in a series of 4 injections.  Spent greater than 25 minutes with patient face-to-face and had greater than 50% of counseling including as described above in assessment and plan.

## 2014-12-04 ENCOUNTER — Ambulatory Visit (INDEPENDENT_AMBULATORY_CARE_PROVIDER_SITE_OTHER): Payer: Medicare Other | Admitting: Family Medicine

## 2014-12-04 ENCOUNTER — Encounter: Payer: Self-pay | Admitting: Family Medicine

## 2014-12-04 VITALS — BP 140/86 | HR 90 | Ht <= 58 in | Wt 135.0 lb

## 2014-12-04 DIAGNOSIS — M1711 Unilateral primary osteoarthritis, right knee: Secondary | ICD-10-CM

## 2014-12-04 NOTE — Assessment & Plan Note (Signed)
Patient was given second in a series of 4 injections with Orthovisc. Patient elected to continue the home exercises, icing protocol, as well as bracing if this is helpful. Patient will come back in 1 week for third out of a series of 4 injections.

## 2014-12-04 NOTE — Progress Notes (Signed)
  Tiffany House D.O. [facility] [address] [place], [place] [address] Phone: [phone]) [phone] Subjective:     CC: right knee pain follow up  BJY:NWGNFAOZHYHPI:Subjective Tiffany BernardLeslie S House is a 69 y.o. female coming in with complaint of right knee pain. Patient does have arthritis of the knee. This is mild-to-moderate in nature. Patient elected to try the Orthovisc injections. Patient had the first injection 1 week ago. Patient states there is some minimal improvement. Patient has not assistant easier to do her exercises. Denies any side effects. Patient is here for the second injection.    patient's previous x-ray shows some mild osteophytic changes.  Past medical history, social, surgical and family history all reviewed in electronic medical record.   Review of Systems: No headache, visual changes, nausea, vomiting, diarrhea, constipation, dizziness, abdominal pain, skin rash, fevers, chills, night sweats, weight loss, swollen lymph nodes, body aches, joint swelling, muscle aches, chest pain, shortness of breath, mood changes.   Objective Blood pressure 140/86, pulse 90, height 4\' 10"  (1.473 m), weight 135 lb (61.236 kg).  General: No apparent distress alert and oriented x3 mood and affect normal, dressed appropriately.  HEENT: Pupils equal, extraocular movements intact  Respiratory: Patient's speak in full sentences and does not appear short of breath  Cardiovascular: No lower extremity edema, non tender, no erythema  Skin: Warm dry intact with no signs of infection or rash on extremities or on axial skeleton.  Abdomen: Soft nontender  Neuro: Cranial nerves II through XII are intact, neurovascularly intact in all extremities with 2+ DTRs and 2+ pulses.  Lymph: No lymphadenopathy of posterior or anterior cervical chain or axillae bilaterally.  Gait normal with good balance and coordination.  MSK:  Non tender with full range of motion and good stability and symmetric strength and tone of shoulders,  elbows, wrist, hip, and ankles bilaterally.  Knee: Right On inspection patient does have osteoarthritic changes Moderate tenderness over the medial joint line ROM full in flexion and extension and lower leg rotation. Ligaments with solid consistent endpoints including ACL, PCL, LCL, MCL. Negative Mcmurray's, Apley's, and Thessalonian tests.  painful patellar compression. With lateral tracking Patellar glide with severe crepitus. Patellar and quadriceps tendons unremarkable. Hamstring and quadriceps strength is normal.   contralateral knee has full range of motion with minimal tenderness over the medial joint line.  No change from previous exam  After informed written and verbal consent, patient was seated on exam table. Right knee was prepped with alcohol swab and utilizing anterolateral approach, patient's right knee space was injected 16 mg/2.5 mL of Orthovisc (sodium hyaluronate) in a prefilled syringe was injected easily into the knee through a 22-gauge needle. Patient tolerated the procedure well without immediate complications.     Impression and Recommendations:     This case required medical decision making of moderate complexity.

## 2014-12-04 NOTE — Patient Instructions (Signed)
Good to see you #2 done In 6 hours ice it to help.  See me next week on Tuesday!

## 2014-12-10 ENCOUNTER — Ambulatory Visit (INDEPENDENT_AMBULATORY_CARE_PROVIDER_SITE_OTHER): Payer: Medicare Other | Admitting: Family Medicine

## 2014-12-10 ENCOUNTER — Encounter: Payer: Self-pay | Admitting: Family Medicine

## 2014-12-10 VITALS — BP 142/86 | HR 82

## 2014-12-10 DIAGNOSIS — M1711 Unilateral primary osteoarthritis, right knee: Secondary | ICD-10-CM | POA: Diagnosis not present

## 2014-12-10 NOTE — Assessment & Plan Note (Signed)
Patient was given third in a series of 4 injections for Orthovisc today. Patient will continue with conservative therapy. Patient will ice in the next 6 hours. Patient and will come back and see me again in 1 week for the fourth and final injection.

## 2014-12-10 NOTE — Patient Instructions (Signed)
Great to see you See you next week Look at [facility].

## 2014-12-10 NOTE — Progress Notes (Signed)
  Tawana ScaleZach Smith D.O. [facility] [address] [place], [place] [address] Phone: [phone](336) [phone] Subjective:     CC: right knee pain follow up  BJY:NWGNFAOZHYHPI:Subjective Donna BernardLeslie S Kludt is a 69 y.o. female coming in with complaint of right knee pain. Patient does have arthritis of the knee. This is mild-to-moderate in nature. Patient has had 2 injections of Orthovisc previously. Patient states overall she has noticed some mild improvement. Patient denies any new symptoms. Continues with the conservative therapy.    patient's previous x-ray shows some mild osteophytic changes.  Past medical history, social, surgical and family history all reviewed in electronic medical record.   Review of Systems: No headache, visual changes, nausea, vomiting, diarrhea, constipation, dizziness, abdominal pain, skin rash, fevers, chills, night sweats, weight loss, swollen lymph nodes, body aches, joint swelling, muscle aches, chest pain, shortness of breath, mood changes.   Objective Blood pressure 142/86, pulse 82, SpO2 98 %.  General: No apparent distress alert and oriented x3 mood and affect normal, dressed appropriately.  HEENT: Pupils equal, extraocular movements intact  Respiratory: Patient's speak in full sentences and does not appear short of breath  Cardiovascular: No lower extremity edema, non tender, no erythema  Skin: Warm dry intact with no signs of infection or rash on extremities or on axial skeleton.  Abdomen: Soft nontender  Neuro: Cranial nerves II through XII are intact, neurovascularly intact in all extremities with 2+ DTRs and 2+ pulses.  Lymph: No lymphadenopathy of posterior or anterior cervical chain or axillae bilaterally.  Gait normal with good balance and coordination.  MSK:  Non tender with full range of motion and good stability and symmetric strength and tone of shoulders, elbows, wrist, hip, and ankles bilaterally.  Knee: Right On inspection patient does have osteoarthritic  changes Moderate tenderness over the medial joint line ROM full in flexion and extension and lower leg rotation. Ligaments with solid consistent endpoints including ACL, PCL, LCL, MCL. Negative Mcmurray's, Apley's, and Thessalonian tests.  painful patellar compression. With lateral tracking but improved.  Patellar glide with severe crepitus. Patellar and quadriceps tendons unremarkable. Hamstring and quadriceps strength is normal.   contralateral knee has full range of motion with minimal tenderness over the medial joint line.  No change from previous exam  After informed written and verbal consent, patient was seated on exam table. Right knee was prepped with alcohol swab and utilizing anterolateral approach, patient's right knee space was injected 16 mg/2.5 mL of Orthovisc (sodium hyaluronate) in a prefilled syringe was injected easily into the knee through a 22-gauge needle. Patient tolerated the procedure well without immediate complications.     Impression and Recommendations:     This case required medical decision making of moderate complexity.

## 2014-12-18 ENCOUNTER — Ambulatory Visit (INDEPENDENT_AMBULATORY_CARE_PROVIDER_SITE_OTHER): Payer: Medicare Other | Admitting: Family Medicine

## 2014-12-18 ENCOUNTER — Encounter: Payer: Self-pay | Admitting: Family Medicine

## 2014-12-18 VITALS — BP 124/78 | HR 102 | Ht <= 58 in | Wt 135.0 lb

## 2014-12-18 DIAGNOSIS — M1711 Unilateral primary osteoarthritis, right knee: Secondary | ICD-10-CM | POA: Diagnosis not present

## 2014-12-18 NOTE — Assessment & Plan Note (Signed)
Patient was given fourth and final injection of Orthovisc day. Patient will continue the exercises in the icing pedicle. Patient will then follow-up with me in one month for further evaluation.

## 2014-12-18 NOTE — Patient Instructions (Addendum)
Good to see you You get a break from me for about 1 month Continue the exercises and the icing, should continue to notice improvement.  See me in 1 month.

## 2014-12-18 NOTE — Progress Notes (Signed)
  Tiffany ScaleZach Zella House D.O. [facility] [address] [place], [place] [address] Phone: [phone](336) [phone] Subjective:     CC: right knee pain follow up  BJY:NWGNFAOZHYHPI:Subjective Tiffany BernardLeslie S House is a 69 y.o. female coming in with complaint of right knee pain. Patient does have arthritis of the knee. This is mild-to-moderate in nature. Patient has had 3 injections of Orthovisc previously. Patient states overall she has noticed some mild improvement. Patient denies any new symptoms. Continues with the conservative therapy.    patient's previous x-ray shows some mild osteophytic changes.  Past medical history, social, surgical and family history all reviewed in electronic medical record.   Review of Systems: No headache, visual changes, nausea, vomiting, diarrhea, constipation, dizziness, abdominal pain, skin rash, fevers, chills, night sweats, weight loss, swollen lymph nodes, body aches, joint swelling, muscle aches, chest pain, shortness of breath, mood changes.   Objective Blood pressure 124/78, pulse 102, height 4\' 10"  (1.473 m), weight 135 lb (61.236 kg), SpO2 96 %.  General: No apparent distress alert and oriented x3 mood and affect normal, dressed appropriately.  HEENT: Pupils equal, extraocular movements intact  Respiratory: Patient's speak in full sentences and does not appear short of breath  Cardiovascular: No lower extremity edema, non tender, no erythema  Skin: Warm dry intact with no signs of infection or rash on extremities or on axial skeleton.  Abdomen: Soft nontender  Neuro: Cranial nerves II through XII are intact, neurovascularly intact in all extremities with 2+ DTRs and 2+ pulses.  Lymph: No lymphadenopathy of posterior or anterior cervical chain or axillae bilaterally.  Gait normal with good balance and coordination.  MSK:  Non tender with full range of motion and good stability and symmetric strength and tone of shoulders, elbows, wrist, hip, and ankles bilaterally.  Knee: Right On  inspection patient does have osteoarthritic changes Moderate tenderness over the medial joint line ROM full in flexion and extension and lower leg rotation. Ligaments with solid consistent endpoints including ACL, PCL, LCL, MCL. Negative Mcmurray's, Apley's, and Thessalonian tests.  painful patellar compression. With lateral tracking but improved.  Patellar glide with severe crepitus. Patellar and quadriceps tendons unremarkable. Hamstring and quadriceps strength is normal.   contralateral knee has full range of motion with minimal tenderness over the medial joint line.  No change from previous exam  After informed written and verbal consent, patient was seated on exam table. Right knee was prepped with alcohol swab and utilizing anterolateral approach, patient's right knee space was injected 16 mg/2.5 mL of Orthovisc (sodium hyaluronate) in a prefilled syringe was injected easily into the knee through a 22-gauge needle. Patient tolerated the procedure well without immediate complications.     Impression and Recommendations:     This case required medical decision making of moderate complexity.

## 2014-12-23 DIAGNOSIS — M1811 Unilateral primary osteoarthritis of first carpometacarpal joint, right hand: Secondary | ICD-10-CM | POA: Diagnosis not present

## 2014-12-27 DIAGNOSIS — M1811 Unilateral primary osteoarthritis of first carpometacarpal joint, right hand: Secondary | ICD-10-CM | POA: Diagnosis not present

## 2015-01-11 ENCOUNTER — Other Ambulatory Visit: Payer: Self-pay | Admitting: Internal Medicine

## 2015-01-15 ENCOUNTER — Telehealth: Payer: Self-pay | Admitting: Internal Medicine

## 2015-01-15 MED ORDER — SIMVASTATIN 40 MG PO TABS: 20.0000 mg | ORAL_TABLET | Freq: Every evening | ORAL | Status: DC

## 2015-01-15 NOTE — Telephone Encounter (Signed)
erx done

## 2015-01-15 NOTE — Telephone Encounter (Signed)
Pt called in requesting refill on her  simvastatin (ZOCOR) 40 MG tablet [[id number]]       simvastatin (ZOCOR) 40 MG tablet [[id number]][[id number]]      Please send to Mail order

## 2015-01-22 ENCOUNTER — Encounter: Payer: Self-pay | Admitting: Family Medicine

## 2015-01-22 ENCOUNTER — Ambulatory Visit (INDEPENDENT_AMBULATORY_CARE_PROVIDER_SITE_OTHER): Payer: Medicare Other | Admitting: Family Medicine

## 2015-01-22 VITALS — BP 124/72 | HR 77 | Ht <= 58 in | Wt 139.0 lb

## 2015-01-22 DIAGNOSIS — M1711 Unilateral primary osteoarthritis, right knee: Secondary | ICD-10-CM | POA: Diagnosis not present

## 2015-01-22 NOTE — Progress Notes (Signed)
Pre visit review using our clinic review tool, if applicable. No additional management support is needed unless otherwise documented below in the visit note. 

## 2015-01-22 NOTE — Progress Notes (Signed)
  Tawana ScaleZach Smith D.O. [facility] [address] [place], [place] [address] Phone: [phone](336) [phone] Subjective:     CC: right knee pain follow up  BJY:NWGNFAOZHYHPI:Subjective Donna BernardLeslie S Winebarger is a 69 y.o. female coming in with complaint of right knee pain. Patient does have arthritis of the knee. This is mild-to-moderate in nature. Patient continued conservative therapy and did finish a full round of Orthovisc. Patient states that she is feeling only approximately 10-20% better. Patient states that there is still times going up and down stairs when he can be severely painful on the anterior lateral aspect of her knee. Still some mild medial joint pain as well. Patient denies any radiation. States that it seems to be tolerable and she is able to do all activities of daily living as well as rest comfortably at night. Patient is okay with this being her new baseline.   patient's previous x-ray shows some mild osteophytic changes.  Past medical history, social, surgical and family history all reviewed in electronic medical record.   Review of Systems: No headache, visual changes, nausea, vomiting, diarrhea, constipation, dizziness, abdominal pain, skin rash, fevers, chills, night sweats, weight loss, swollen lymph nodes, body aches, joint swelling, muscle aches, chest pain, shortness of breath, mood changes.   Objective Blood pressure 124/72, pulse 77, height 4\' 10"  (1.473 m), weight 139 lb (63.05 kg), SpO2 97 %.  General: No apparent distress alert and oriented x3 mood and affect normal, dressed appropriately.  HEENT: Pupils equal, extraocular movements intact  Respiratory: Patient's speak in full sentences and does not appear short of breath  Cardiovascular: No lower extremity edema, non tender, no erythema  Skin: Warm dry intact with no signs of infection or rash on extremities or on axial skeleton.  Abdomen: Soft nontender  Neuro: Cranial nerves II through XII are intact, neurovascularly intact in all  extremities with 2+ DTRs and 2+ pulses.  Lymph: No lymphadenopathy of posterior or anterior cervical chain or axillae bilaterally.  Gait normal with good balance and coordination.  MSK:  Non tender with full range of motion and good stability and symmetric strength and tone of shoulders, elbows, wrist, hip, and ankles bilaterally.  Knee: Right On inspection patient does have osteoarthritic changes Decreased tenderness from previous exam ROM full in flexion and extension and lower leg rotation. Ligaments with solid consistent endpoints including ACL, PCL, LCL, MCL. Negative Mcmurray's, Apley's, and Thessalonian tests.  painful patellar compression. With lateral tracking but improved.  Patellar glide with severe crepitus. Patellar and quadriceps tendons unremarkable. Hamstring and quadriceps strength is normal.   contralateral knee has full range of motion with minimal tenderness over the medial joint line.  Moderate improvement from previous exam    Impression and Recommendations:     This case required medical decision making of moderate complexity.

## 2015-01-22 NOTE — Patient Instructions (Signed)
Good to see you Ice still is good continue to be active If worsen we can do injection or we can consider MRI

## 2015-01-22 NOTE — Assessment & Plan Note (Addendum)
Patient does have mild to moderate osteophytic changes of the right knee. Patient did try the Orthovisc injections and did not make as much's improvement as we would like to see. We discussed the possibility of further imaging including an MRI to further evaluate the amount of arthritis.

## 2015-02-05 DIAGNOSIS — M1811 Unilateral primary osteoarthritis of first carpometacarpal joint, right hand: Secondary | ICD-10-CM | POA: Diagnosis not present

## 2015-02-14 ENCOUNTER — Other Ambulatory Visit: Payer: Self-pay | Admitting: Internal Medicine

## 2015-03-12 ENCOUNTER — Other Ambulatory Visit: Payer: Self-pay | Admitting: Internal Medicine

## 2015-03-12 ENCOUNTER — Ambulatory Visit (HOSPITAL_COMMUNITY)
Admission: RE | Admit: 2015-03-12 | Discharge: 2015-03-12 | Disposition: A | Discharge status: Home / Self Care | Payer: Medicare Other | Source: Ambulatory Visit | Attending: Internal Medicine | Admitting: Internal Medicine

## 2015-03-12 DIAGNOSIS — Z1231 Encounter for screening mammogram for malignant neoplasm of breast: Secondary | ICD-10-CM | POA: Insufficient documentation

## 2015-03-18 ENCOUNTER — Other Ambulatory Visit: Payer: Self-pay | Admitting: Internal Medicine

## 2015-03-18 DIAGNOSIS — R928 Other abnormal and inconclusive findings on diagnostic imaging of breast: Secondary | ICD-10-CM

## 2015-03-21 ENCOUNTER — Ambulatory Visit
Admission: RE | Admit: 2015-03-21 | Discharge: 2015-03-21 | Disposition: A | Discharge status: Home / Self Care | Payer: Medicare Other | Source: Ambulatory Visit | Attending: Internal Medicine | Admitting: Internal Medicine

## 2015-03-21 DIAGNOSIS — R928 Other abnormal and inconclusive findings on diagnostic imaging of breast: Secondary | ICD-10-CM

## 2015-03-21 DIAGNOSIS — R922 Inconclusive mammogram: Secondary | ICD-10-CM | POA: Diagnosis not present

## 2015-05-20 ENCOUNTER — Other Ambulatory Visit: Payer: Self-pay | Admitting: Geriatric Medicine

## 2015-05-20 ENCOUNTER — Telehealth: Payer: Self-pay | Admitting: Internal Medicine

## 2015-05-20 MED ORDER — SIMVASTATIN 40 MG PO TABS: 20.0000 mg | ORAL_TABLET | Freq: Every evening | ORAL | Status: DC

## 2015-05-20 NOTE — Telephone Encounter (Signed)
Patient need a refill of simvastatin  40 mg, for [facility],  they will need her birth date [birth year],  Acct [id number]  [place], ID # [id number].  Fax to [facility] [phone]

## 2015-05-20 NOTE — Telephone Encounter (Signed)
Sent to pharmacy 

## 2015-06-05 ENCOUNTER — Telehealth: Payer: Self-pay | Admitting: Internal Medicine

## 2015-06-05 ENCOUNTER — Ambulatory Visit (INDEPENDENT_AMBULATORY_CARE_PROVIDER_SITE_OTHER): Payer: Medicare Other | Admitting: Internal Medicine

## 2015-06-05 ENCOUNTER — Encounter: Payer: Self-pay | Admitting: Internal Medicine

## 2015-06-05 VITALS — BP 118/68 | HR 79 | Temp 97.9°F | Ht <= 58 in | Wt 132.5 lb

## 2015-06-05 DIAGNOSIS — F4321 Adjustment disorder with depressed mood: Secondary | ICD-10-CM | POA: Diagnosis not present

## 2015-06-05 DIAGNOSIS — F4320 Adjustment disorder, unspecified: Secondary | ICD-10-CM | POA: Insufficient documentation

## 2015-06-05 DIAGNOSIS — Z23 Encounter for immunization: Secondary | ICD-10-CM | POA: Diagnosis not present

## 2015-06-05 MED ORDER — PAROXETINE HCL 10 MG PO TABS: 10.0000 mg | ORAL_TABLET | Freq: Every day | ORAL | Status: DC

## 2015-06-05 NOTE — Progress Notes (Signed)
   Subjective:    Patient ID: Tiffany House, female    DOB: [birth year], 69 y.o.   MRN: [id number]  HPI The patient is a 69 YO female who is coming in today for increasing levels of stress and some labile moods. 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 She does not have as much time for herself. Her family and friends have noticed the change in her mood to more labile. She is more down when things do not go as planned.   Review of Systems  Constitutional: Negative.   Respiratory: Negative.   Cardiovascular: Negative.   Neurological: Negative.   Psychiatric/Behavioral: Positive for dysphoric mood and decreased concentration. Negative for behavioral problems, sleep disturbance, self-injury and agitation. The patient is not nervous/anxious.       Objective:   Physical Exam  Constitutional: She is oriented to person, place, and time. She appears well-developed and well-nourished.  HENT:  Head: Normocephalic and atraumatic.  Eyes: EOM are normal.  Neck: Normal range of motion.  Cardiovascular: Normal rate and regular rhythm.   Pulmonary/Chest: Effort normal and breath sounds normal. No respiratory distress. She has no wheezes. She has no rales.  Abdominal: Soft. Bowel sounds are normal. She exhibits no distension. There is no tenderness. There is no rebound.  Neurological: She is alert and oriented to person, place, and time. Coordination normal.  Skin: Skin is warm and dry.   Filed Vitals:   06/05/15 1128  BP: 118/68  Pulse: 79  Temp: 97.9 F (36.6 C)  TempSrc: Oral  Height: 4\' 10"  (1.473 m)  Weight: 132 lb 8 oz (60.102 kg)  SpO2: 95%      Assessment & Plan:  Prevnar given at visit.

## 2015-06-05 NOTE — Telephone Encounter (Signed)
Disregard

## 2015-06-05 NOTE — Assessment & Plan Note (Signed)
Will try paroxetine 10 mg daily to see if this helps. 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 Also given activities to help with stress reduction.

## 2015-06-05 NOTE — Patient Instructions (Signed)
We are going to try a medicine called paroxetine for the mood to see if this can help with keeping you from the extreme changes of mood as quickly. I would recommend taking it with a meal. This is the lowest dose and if it does not help we can always change the dose. It can take 4-5 weeks for it to get into the blood stream at steady levels.   We have given you the pneumonia booster shot.   Come back in about 3-6 months to check on how you are doing.   Stress and Stress Management Stress is a normal reaction to life events. It is what you feel when life demands more than you are used to or more than you can handle. Some stress can be useful. For example, the stress reaction can help you catch the last bus of the day, study for a test, or meet a deadline at work. But stress that occurs too often or for too long can cause problems. It can affect your emotional health and interfere with relationships and normal daily activities. Too much stress can weaken your immune system and increase your risk for physical illness. If you already have a medical problem, stress can make it worse. CAUSES  All sorts of life events may cause stress. An event that causes stress for one person may not be stressful for another person. Major life events commonly cause stress. These may be positive or negative. Examples include losing your job, moving into a new home, getting married, having a baby, or losing a loved one. Less obvious life events may also cause stress, especially if they occur day after day or in combination. Examples include working long hours, driving in traffic, caring for children, being in debt, or being in a difficult relationship. SIGNS AND SYMPTOMS Stress may cause emotional symptoms including, the following:  Anxiety. This is feeling worried, afraid, on edge, overwhelmed, or out of control.  Anger. This is feeling irritated or impatient.  Depression. This is feeling sad, down, helpless, or  guilty.  Difficulty focusing, remembering, or making decisions. Stress may cause physical symptoms, including the following:   Aches and pains. These may affect your head, neck, back, stomach, or other areas of your body.  Tight muscles or clenched jaw.  Low energy or trouble sleeping. Stress may cause unhealthy behaviors, including the following:   Eating to feel better (overeating) or skipping meals.  Sleeping too little, too much, or both.  Working too much or putting off tasks (procrastination).  Smoking, drinking alcohol, or using drugs to feel better. DIAGNOSIS  Stress is diagnosed through an assessment by your health care provider. Your health care provider will ask questions about your symptoms and any stressful life events.Your health care provider will also ask about your medical history and may order blood tests or other tests. Certain medical conditions and medicine can cause physical symptoms similar to stress. Mental illness can cause emotional symptoms and unhealthy behaviors similar to stress. Your health care provider may refer you to a mental health professional for further evaluation.  TREATMENT  Stress management is the recommended treatment for stress.The goals of stress management are reducing stressful life events and coping with stress in healthy ways.  Techniques for reducing stressful life events include the following:  Stress identification. Self-monitor for stress and identify what causes stress for you. These skills may help you to avoid some stressful events.  Time management. Set your priorities, keep a calendar of events, and  learn to say "no." These tools can help you avoid making too many commitments. Techniques for coping with stress include the following:  Rethinking the problem. Try to think realistically about stressful events rather than ignoring them or overreacting. Try to find the positives in a stressful situation rather than focusing on  the negatives.  Exercise. Physical exercise can release both physical and emotional tension. The key is to find a form of exercise you enjoy and do it regularly.  Relaxation techniques. These relax the body and mind. Examples include yoga, meditation, tai chi, biofeedback, deep breathing, progressive muscle relaxation, listening to music, being out in nature, journaling, and other hobbies. Again, the key is to find one or more that you enjoy and can do regularly.  Healthy lifestyle. Eat a balanced diet, get plenty of sleep, and do not smoke. Avoid using alcohol or drugs to relax.  Strong support network. Spend time with family, friends, or other people you enjoy being around.Express your feelings and talk things over with someone you trust. Counseling or talktherapy with a mental health professional may be helpful if you are having difficulty managing stress on your own. Medicine is typically not recommended for the treatment of stress.Talk to your health care provider if you think you need medicine for symptoms of stress. HOME CARE INSTRUCTIONS  Keep all follow-up visits as directed by your health care provider.  Take all medicines as directed by your health care provider. SEEK MEDICAL CARE IF:  Your symptoms get worse or you start having new symptoms.  You feel overwhelmed by your problems and can no longer manage them on your own. SEEK IMMEDIATE MEDICAL CARE IF:  You feel like hurting yourself or someone else. Document Released: 05/11/2001 Document Revised: 04/01/2014 Document Reviewed: 07/10/2013 [employer] Patient Information 2015 [place], [place]. This information is not intended to replace advice given to you by your health care provider. Make sure you discuss any questions you have with your health care provider.

## 2015-06-05 NOTE — Progress Notes (Signed)
Pre visit review using our clinic review tool, if applicable. No additional management support is needed unless otherwise documented below in the visit note. 

## 2015-08-27 ENCOUNTER — Other Ambulatory Visit (INDEPENDENT_AMBULATORY_CARE_PROVIDER_SITE_OTHER): Payer: Medicare Other

## 2015-08-27 ENCOUNTER — Ambulatory Visit (INDEPENDENT_AMBULATORY_CARE_PROVIDER_SITE_OTHER): Payer: Medicare Other | Admitting: Internal Medicine

## 2015-08-27 ENCOUNTER — Encounter: Payer: Self-pay | Admitting: Internal Medicine

## 2015-08-27 VITALS — BP 132/82 | HR 80 | Temp 98.3°F | Resp 16 | Ht 59.5 in | Wt 136.0 lb

## 2015-08-27 DIAGNOSIS — R7301 Impaired fasting glucose: Secondary | ICD-10-CM | POA: Diagnosis not present

## 2015-08-27 DIAGNOSIS — F4321 Adjustment disorder with depressed mood: Secondary | ICD-10-CM

## 2015-08-27 DIAGNOSIS — E785 Hyperlipidemia, unspecified: Secondary | ICD-10-CM | POA: Diagnosis not present

## 2015-08-27 DIAGNOSIS — Z0000 Encounter for general adult medical examination without abnormal findings: Secondary | ICD-10-CM

## 2015-08-27 LAB — HEMOGLOBIN A1C: HEMOGLOBIN A1C: 5.3 % (ref 4.6–6.5)

## 2015-08-27 LAB — COMPREHENSIVE METABOLIC PANEL
ALT: 20 U/L (ref 0–35)
AST: 21 U/L (ref 0–37)
Albumin: 4.2 g/dL (ref 3.5–5.2)
Alkaline Phosphatase: 67 U/L (ref 39–117)
BUN: 19 mg/dL (ref 6–23)
CALCIUM: 9.8 mg/dL (ref 8.4–10.5)
CHLORIDE: 105 meq/L (ref 96–112)
CO2: 31 mEq/L (ref 19–32)
Creatinine, Ser: 1.04 mg/dL (ref 0.40–1.20)
GFR: 55.6 mL/min — ABNORMAL LOW (ref >60.00)
GLUCOSE: 94 mg/dL (ref 70–99)
Potassium: 4.5 mEq/L (ref 3.5–5.1)
Sodium: 140 mEq/L (ref 135–145)
Total Bilirubin: 0.5 mg/dL (ref 0.2–1.2)
Total Protein: 6.6 g/dL (ref 6.0–8.3)

## 2015-08-27 LAB — LIPID PANEL
CHOL/HDL RATIO: 4
Cholesterol: 191 mg/dL (ref 0–200)
HDL: 50.4 mg/dL (ref >39.00)
LDL CALC: 117 mg/dL — AB (ref 0–99)
NONHDL: 140.13
Triglycerides: 117 mg/dL (ref 0.0–149.0)
VLDL: 23.4 mg/dL (ref 0.0–40.0)

## 2015-08-27 MED ORDER — PAROXETINE HCL 10 MG PO TABS: 10.0000 mg | ORAL_TABLET | Freq: Every day | ORAL | Status: DC

## 2015-08-27 NOTE — Assessment & Plan Note (Signed)
Checking lipid panel today as overdue. On zocor without side effects.

## 2015-08-27 NOTE — Progress Notes (Signed)
   Subjective:    Patient ID: Tiffany House, female    DOB: [birth year], 69 y.o.   MRN: [id number]  HPI The patient is a 69 YO female coming in for follow up of her adjustment disorder. We did start her on low dose paxil at last visit. She has done well with this and her family has seen positive improvements in her mood. Less labile. She is having some mild side effect of dry mouth with it but willing to continue given the good results. No SI/HI. Just returned from a trip to [place] which was very good for her.   Review of Systems  Constitutional: Negative.   Respiratory: Negative.   Cardiovascular: Negative.   Neurological: Negative.   Psychiatric/Behavioral: Negative for behavioral problems, sleep disturbance, self-injury, dysphoric mood, decreased concentration and agitation. The patient is not nervous/anxious.       Objective:   Physical Exam  Constitutional: She is oriented to person, place, and time. She appears well-developed and well-nourished.  HENT:  Head: Normocephalic and atraumatic.  Eyes: EOM are normal.  Neck: Normal range of motion.  Cardiovascular: Normal rate and regular rhythm.   Pulmonary/Chest: Effort normal and breath sounds normal. No respiratory distress. She has no wheezes. She has no rales.  Abdominal: Soft. Bowel sounds are normal. She exhibits no distension. There is no tenderness. There is no rebound.  Neurological: She is alert and oriented to person, place, and time. Coordination normal.  Skin: Skin is warm and dry.   Filed Vitals:   08/27/15 1011  BP: 132/82  Pulse: 80  Temp: 98.3 F (36.8 C)  TempSrc: Oral  Resp: 16  Height: 4' 11.5" (1.511 m)  Weight: 136 lb (61.689 kg)  SpO2: 96%      Assessment & Plan:

## 2015-08-27 NOTE — Progress Notes (Signed)
Pre visit review using our clinic review tool, if applicable. No additional management support is needed unless otherwise documented below in the visit note. 

## 2015-08-27 NOTE — Assessment & Plan Note (Signed)
She is doing well on paxil 10 mg daily. Talked to her about using mouth wash or OTC medicines to keep her mouth from getting dry. She does follow with dentist regularly.

## 2015-08-27 NOTE — Assessment & Plan Note (Signed)
Checking HgA1c for follow up of this problem.

## 2015-09-01 ENCOUNTER — Telehealth: Payer: Self-pay | Admitting: Internal Medicine
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
## 2015-09-26 DIAGNOSIS — Z23 Encounter for immunization: Secondary | ICD-10-CM | POA: Diagnosis not present

## 2015-11-27 ENCOUNTER — Encounter: Payer: Self-pay | Admitting: *Deleted

## 2016-01-05 ENCOUNTER — Other Ambulatory Visit: Payer: Self-pay | Admitting: Internal Medicine

## 2016-02-09 ENCOUNTER — Other Ambulatory Visit: Payer: Self-pay | Admitting: Internal Medicine

## 2016-02-23 ENCOUNTER — Other Ambulatory Visit (INDEPENDENT_AMBULATORY_CARE_PROVIDER_SITE_OTHER): Payer: Medicare Other

## 2016-02-23 ENCOUNTER — Ambulatory Visit (INDEPENDENT_AMBULATORY_CARE_PROVIDER_SITE_OTHER): Payer: Medicare Other | Admitting: Internal Medicine

## 2016-02-23 ENCOUNTER — Encounter: Payer: Self-pay | Admitting: Internal Medicine

## 2016-02-23 VITALS — BP 122/78 | HR 96 | Temp 98.6°F | Resp 18 | Ht 59.5 in | Wt 143.0 lb

## 2016-02-23 DIAGNOSIS — R1032 Left lower quadrant pain: Secondary | ICD-10-CM | POA: Diagnosis not present

## 2016-02-23 DIAGNOSIS — E039 Hypothyroidism, unspecified: Secondary | ICD-10-CM

## 2016-02-23 LAB — COMPREHENSIVE METABOLIC PANEL
ALBUMIN: 4.4 g/dL (ref 3.5–5.2)
ALK PHOS: 71 U/L (ref 39–117)
ALT: 20 U/L (ref 0–35)
AST: 20 U/L (ref 0–37)
BUN: 18 mg/dL (ref 6–23)
CO2: 27 mEq/L (ref 19–32)
CREATININE: 1.04 mg/dL (ref 0.40–1.20)
Calcium: 10 mg/dL (ref 8.4–10.5)
Chloride: 105 mEq/L (ref 96–112)
GFR: 55.52 mL/min — ABNORMAL LOW (ref >60.00)
GLUCOSE: 96 mg/dL (ref 70–99)
Potassium: 4 mEq/L (ref 3.5–5.1)
SODIUM: 142 meq/L (ref 135–145)
TOTAL PROTEIN: 7.1 g/dL (ref 6.0–8.3)
Total Bilirubin: 0.4 mg/dL (ref 0.2–1.2)

## 2016-02-23 LAB — LIPASE: LIPASE: 26 U/L (ref 11.0–59.0)

## 2016-02-23 LAB — CBC
HCT: 40.2 % (ref 36.0–46.0)
Hemoglobin: 13.6 g/dL (ref 12.0–15.0)
MCHC: 33.7 g/dL (ref 30.0–36.0)
MCV: 87.7 fl (ref 78.0–100.0)
Platelets: 281 10*3/uL (ref 150.0–400.0)
RBC: 4.59 Mil/uL (ref 3.87–5.11)
RDW: 14 % (ref 11.5–15.5)
WBC: 8.6 10*3/uL (ref 4.0–10.5)

## 2016-02-23 LAB — T4, FREE: Free T4: 0.9 ng/dL (ref 0.60–1.60)

## 2016-02-23 LAB — TSH: TSH: 3.92 u[IU]/mL (ref 0.35–4.50)

## 2016-02-23 NOTE — Assessment & Plan Note (Signed)
Checking labs including CBC, CMP, lipase. Ordering CT abdomen and pelvis. Symptoms are lower abdomen or pelvis. Hx kidney stones but history not consistent with this. Hx diverticulosis on colonoscopy but not typical for diverticulitis at this time with symptoms coming and going and only lasting for 1 day mostly. If no etiology may refer back to GI for consideration of colonsocopy (especially if new anemia).

## 2016-02-23 NOTE — Progress Notes (Signed)
   Subjective:    Patient ID: Tiffany BernardLeslie S Cawthorn, female    DOB: June 11, 1946, 70 y.o.   MRN: [id number]  HPI The patient is a 70 YO female coming in acutely for gas and bloating with some pain in her stomach. Symptoms started about 3 years ago. They have been fairly stable since that time. She has tried changing her diet without relief. Most recent colonoscopy 2010 and normal (repeat due in 2020). Has not tried anything for it. No blood in her stools or change in pattern or consistency. Takes metamucil daily and uses gas-x with it and this does not cause her problems. Happens every couple of weeks. No known food triggers or behavioral changes before they start. No fevers or chills with the episodes. Pain is in the LLQ and RLQ. No radiation to the back or legs. Relieved with sitting. Lasts 24-48 hours.   Review of Systems  Constitutional: Negative.   Respiratory: Negative.   Cardiovascular: Negative.   Gastrointestinal: Positive for abdominal pain and abdominal distention. Negative for nausea, diarrhea, constipation and blood in stool.  Neurological: Negative.   Psychiatric/Behavioral: Negative for behavioral problems, sleep disturbance, self-injury, dysphoric mood, decreased concentration and agitation. The patient is not nervous/anxious.       Objective:   Physical Exam  Constitutional: She is oriented to person, place, and time. She appears well-developed and well-nourished.  HENT:  Head: Normocephalic and atraumatic.  Hearing aids in place  Eyes: EOM are normal.  Neck: Normal range of motion.  Cardiovascular: Normal rate and regular rhythm.   Pulmonary/Chest: Effort normal and breath sounds normal. No respiratory distress. She has no wheezes. She has no rales.  Abdominal: Soft. Bowel sounds are normal. She exhibits no distension and no mass. There is no tenderness. There is no rebound.  No symptoms today  Neurological: She is alert and oriented to person, place, and time. Coordination normal.   Skin: Skin is warm and dry.   Filed Vitals:   02/23/16 1014  BP: 122/78  Pulse: 96  Temp: 98.6 F (37 C)  TempSrc: Oral  Resp: 18  Height: 4' 11.5" (1.511 m)  Weight: 143 lb (64.864 kg)  SpO2: 96%      Assessment & Plan:

## 2016-02-23 NOTE — Patient Instructions (Signed)
We will check the labs today and work on getting the imaging of the stomach to make sure nothing is changes from several years ago.

## 2016-02-23 NOTE — Progress Notes (Signed)
Pre visit review using our clinic review tool, if applicable. No additional management support is needed unless otherwise documented below in the visit note. 

## 2016-02-23 NOTE — Assessment & Plan Note (Signed)
Needs check of levels, checking TSH and free T4.

## 2016-03-05 ENCOUNTER — Ambulatory Visit
Admission: RE | Admit: 2016-03-05 | Discharge: 2016-03-05 | Disposition: A | Discharge status: Home / Self Care | Payer: Medicare Other | Source: Ambulatory Visit | Attending: Internal Medicine | Admitting: Internal Medicine

## 2016-03-05 DIAGNOSIS — R1032 Left lower quadrant pain: Secondary | ICD-10-CM

## 2016-03-05 DIAGNOSIS — K5730 Diverticulosis of large intestine without perforation or abscess without bleeding: Secondary | ICD-10-CM | POA: Diagnosis not present

## 2016-03-08 ENCOUNTER — Other Ambulatory Visit: Payer: Self-pay | Admitting: Internal Medicine

## 2016-03-08 DIAGNOSIS — K4090 Unilateral inguinal hernia, without obstruction or gangrene, not specified as recurrent: Secondary | ICD-10-CM

## 2016-03-24 DIAGNOSIS — K4090 Unilateral inguinal hernia, without obstruction or gangrene, not specified as recurrent: Secondary | ICD-10-CM | POA: Diagnosis not present

## 2016-03-29 HISTORY — PX: HERNIA REPAIR: SHX51

## 2016-04-14 DIAGNOSIS — K4090 Unilateral inguinal hernia, without obstruction or gangrene, not specified as recurrent: Secondary | ICD-10-CM | POA: Diagnosis not present

## 2016-06-30 ENCOUNTER — Other Ambulatory Visit: Payer: Self-pay | Admitting: Internal Medicine

## 2016-08-09 ENCOUNTER — Other Ambulatory Visit: Payer: Self-pay | Admitting: Internal Medicine

## 2016-08-12 ENCOUNTER — Other Ambulatory Visit: Payer: Self-pay | Admitting: *Deleted

## 2016-08-12 MED ORDER — PAROXETINE HCL 10 MG PO TABS: 10.0000 mg | ORAL_TABLET | Freq: Every day | ORAL | 0 refills | Status: DC

## 2016-09-07 ENCOUNTER — Other Ambulatory Visit: Payer: Self-pay | Admitting: Internal Medicine

## 2016-09-13 DIAGNOSIS — Z23 Encounter for immunization: Secondary | ICD-10-CM | POA: Diagnosis not present

## 2016-11-24 NOTE — Progress Notes (Signed)
Subjective:   Tiffany House is a 70 y.o. female who presents for Medicare Annual (Subsequent) preventive examination.  The Patient was informed that the wellness visit is to identify future health risk and educate and initiate measures that can reduce risk for increased disease through the lifespan.   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 Enjoys crafts, puzzles and reading.   Review of Systems:  No ROS.  Medicare Wellness Visit.  Cardiac Risk Factors include: advanced age (>5555men, 51>65 women);dyslipidemia;hypertension;family history of premature cardiovascular disease   Sleep patterns: Sleeps about 9 hours, up 1-2 x to void.  Home Safety/Smoke Alarms:  Smoke detectors in place.  Living environment; residence and Firearm Safety: Elderly parents live with patient in 2 story home. No firearms.  Seat Belt Safety/Bike Helmet: Wears seat belts.    Counseling:   Eye Exam-Last exam 08/2015, every 2 years. Unsure of providers name.  Dental-Last exam > 5 years. Denies dental/mouth issues.   Female:   Pap-11/02/2010       Mammo-03/21/2015, negative.        Dexa scan-01/09/2014, osteoporosis. Takes Calcium and Vitamin D. Plans to repeat at time of mammo in 02/2017.     CCS-colonoscopy 01/31/2009, diverticulosis. Recall 10 years.      Objective:     Vitals: BP 132/76 (BP Location: Left Arm, Patient Position: Sitting, Cuff Size: Normal)   Pulse 76   Resp 18   Ht 5' (1.524 m)   Wt 141 lb (64 kg)   SpO2 98%   BMI 27.54 kg/m   Body mass index is 27.54 kg/m.   Tobacco History  Smoking Status  . Never Smoker  Smokeless Tobacco  . Never Used     Counseling given: No   Past Medical History:  Diagnosis Date  . Allergic rhinitis, cause unspecified   . Arthritis   . Asthma   . Benign positional vertigo   . GERD   . HYPERLIPIDEMIA   . Hypertension   . HYPOTHYROIDISM dx 10/2010  . Low back pain    MRI 02/2011  . Migraines   . OSTEOPENIA   . Osteoporosis    Qualifier: Diagnosis of  By: Nena JordanYoo DO, D. Robert   . RENAL CALCULUS, RIGHT    L 01/2012   Past Surgical History:  Procedure Laterality Date  . HAND SURGERY  2001   Dr Teressa SenterSypher-- surgery of right hand to rebuild joint  . HERNIA REPAIR  03/2016  . Kidney stone removed  01/12/10  . TUBAL LIGATION  1979   Family History  Problem Relation Age of Onset  . Hyperlipidemia Mother   . Hypertension Mother   . Cancer Father     prostate. [name]  . Coronary artery disease Father   . Hypertension Father    History  Sexual Activity  . Sexual activity: Not on file    Outpatient Encounter Prescriptions as of 11/25/2016  Medication Sig  . calcium carbonate (CALCIUM 500) 1250 MG tablet Take 2 tablets by mouth daily.    . Cholecalciferol (VITAMIN D-3) 1000 UNITS CAPS Take by mouth. Take one tablet twice daily  . fish oil-omega-3 fatty acids 1000 MG capsule Take 2 g by mouth 2 (two) times daily.  Marland Kitchen. glucosamine-chondroitin 500-400 MG tablet Take 1 tablet by mouth 2 (two) times daily.  Marland Kitchen. levothyroxine (SYNTHROID, LEVOTHROID) 25 MCG tablet TAKE 1 TABLET (25 MCG TOTAL) BY MOUTH DAILY.  . Multiple Vitamin (MULTIVITAMIN) tablet Take 1 tablet by mouth daily.    .Marland Kitchen  multivitamin-lutein (OCUVITE-LUTEIN) CAPS capsule Take 1 capsule by mouth daily.  Marland Kitchen omeprazole (PRILOSEC) 40 MG capsule TAKE 1 CAPSULE EVERY DAY  . PARoxetine (PAXIL) 10 MG tablet Take 1 tablet (10 mg total) by mouth daily. Yearly physical is due must see MD for future refills  . psyllium (METAMUCIL) 58.6 % powder Take 1 packet by mouth 2 (two) times daily as needed.    . simvastatin (ZOCOR) 40 MG tablet TAKE 1/2 TABLET EVERY EVENING  . [DISCONTINUED] Cholecalciferol (VITAMIN D-3 PO) Take by mouth.  . diclofenac sodium (VOLTAREN) 1 % GEL Apply 2 g topically 4 (four) times daily. (Patient not taking: Reported on 11/25/2016)  . [DISCONTINUED] OVER THE COUNTER MEDICATION 1 tablet 2 (two) times daily. Tumeric 482mg    No facility-administered  encounter medications on file as of 11/25/2016.     Activities of Daily Living In your present state of health, do you have any difficulty performing the following activities: 11/25/2016  Hearing? N  Vision? N  Difficulty concentrating or making decisions? N  Walking or climbing stairs? N  Dressing or bathing? N  Doing errands, shopping? N  Preparing Food and eating ? N  Using the Toilet? N  In the past six months, have you accidently leaked urine? N  Do you have problems with loss of bowel control? N  Managing your Medications? N  Managing your Finances? N  Housekeeping or managing your Housekeeping? N  Some recent data might be hidden    Patient Care Team: Myrlene Broker, MD as PCP - General (Internal Medicine) Valeria Batman, MD as Consulting Physician (Orthopedic Surgery) Serena Colonel, MD (Otolaryngology) Bjorn Pippin, MD (Urology) Enid Baas, MD (Family Medicine) Santiago Glad, MD (Specialist) Antoine Primas, MD as Resident    Assessment:    Physical assessment deferred to PCP.  Exercise Activities and Dietary recommendations Current Exercise Habits: The patient does not participate in regular exercise at present (Active taking care of elderly parents), Exercise limited by: orthopedic condition(s) Patient reports arthritis (knees, ankles and hands) prevent her from exercise program.    Diet (meal preparation, eat out, water intake, caffeinated beverages, dairy products, fruits and vegetables): Eats at home. Drinks chai tea and water.   Breakfast: Skips, hot tea. Lunch: yogurt Dinner: meat, vegetables, waffles, fruit   Discussed heart healthy diet and avoiding skipping meals.  Discussed maintaining active lifestyle, possibly increasing exercise as tolerated.  Goals      Patient Stated   . <enter goal here> (pt-stated)          To maintain current health.       Fall Risk Fall Risk  11/25/2016 02/23/2016 01/02/2014  Falls in the past year? Yes No No    Number falls in past yr: 1 - -  Injury with Fall? No - -  Follow up Falls prevention discussed - -   Depression Screen PHQ 2/9 Scores 11/25/2016 02/23/2016 01/02/2014  PHQ - 2 Score 0 0 0     Cognitive Function       Ad8 score reviewed for issues:  Issues making decisions:no  Less interest in hobbies / activities:no  Repeats questions, stories (family complaining):no  Trouble using ordinary gadgets (microwave, computer, phone):no  Forgets the month or year: no  Mismanaging finances: no  Remembering appts:no  Daily problems with thinking and/or memory:no Ad8 score is=0     Immunization History  Administered Date(s) Administered  . Influenza Split 10/06/2011  . Influenza Whole 08/19/2008, 08/28/2009, 08/31/2010  . Influenza, High Dose Seasonal  PF 08/29/2013  . Influenza-Unspecified 09/13/2012, 08/22/2014, 09/26/2015, 08/29/2016  . Pneumococcal Conjugate-13 06/05/2015  . Pneumococcal Polysaccharide-23 01/02/2014  . Td 10/19/2000, 11/02/2010  . Zoster 05/17/2015   Screening Tests Health Maintenance  Topic Date Due  . Hepatitis C Screening  11/25/2017 (Originally May 09, 1946)  . MAMMOGRAM  03/20/2017  . COLONOSCOPY  02/01/2019  . TETANUS/TDAP  11/02/2020  . INFLUENZA VACCINE  Completed  . DEXA SCAN  Completed  . ZOSTAVAX  Completed  . PNA vac Low Risk Adult  Completed      Plan:     Eat heart healthy diet (full of fruits, vegetables, whole grains, lean protein, water--limit salt, fat, and sugar intake) and increase physical activity as tolerated.  Continue doing brain stimulating activities (puzzles, reading, adult coloring books, staying active) to keep memory sharp.   Bring a copy of your advance directives to your next office visit.  During the course of the visit the patient was educated and counseled about the following appropriate screening and preventive services:   Vaccines to include Pneumoccal, Influenza, Hepatitis B, Td, Zostavax,  HCV  Cardiovascular Disease  Colorectal cancer screening  Bone density screening  Diabetes screening  Glaucoma screening  Mammography/PAP  Nutrition counseling   Patient Instructions (the written plan) was given to the patient.   Alysia PennaKimberly R Ayaat Jansma, RN  11/25/2016

## 2016-11-25 ENCOUNTER — Ambulatory Visit (INDEPENDENT_AMBULATORY_CARE_PROVIDER_SITE_OTHER): Payer: Medicare Other

## 2016-11-25 VITALS — BP 132/76 | HR 76 | Resp 18 | Ht 60.0 in | Wt 141.0 lb

## 2016-11-25 DIAGNOSIS — Z0000 Encounter for general adult medical examination without abnormal findings: Secondary | ICD-10-CM

## 2016-11-25 NOTE — Patient Instructions (Addendum)
Eat heart healthy diet (full of fruits, vegetables, whole grains, lean protein, water--limit salt, fat, and sugar intake) and increase physical activity as tolerated.  Continue doing brain stimulating activities (puzzles, reading, adult coloring books, staying active) to keep memory sharp.   Bring a copy of your advance directives to your next office visit.  Fall Prevention in the Home Introduction Falls can cause injuries. They can happen to people of all ages. There are many things you can do to make your home safe and to help prevent falls. What can I do on the outside of my home?  Regularly fix the edges of walkways and driveways and fix any cracks.  Remove anything that might make you trip as you walk through a door, such as a raised step or threshold.  Trim any bushes or trees on the path to your home.  Use bright outdoor lighting.  Clear any walking paths of anything that might make someone trip, such as rocks or tools.  Regularly check to see if handrails are loose or broken. Make sure that both sides of any steps have handrails.  Any raised decks and porches should have guardrails on the edges.  Have any leaves, snow, or ice cleared regularly.  Use sand or salt on walking paths during winter.  Clean up any spills in your garage right away. This includes oil or grease spills. What can I do in the bathroom?  Use night lights.  Install grab bars by the toilet and in the tub and shower. Do not use towel bars as grab bars.  Use non-skid mats or decals in the tub or shower.  If you need to sit down in the shower, use a plastic, non-slip stool.  Keep the floor dry. Clean up any water that spills on the floor as soon as it happens.  Remove soap buildup in the tub or shower regularly.  Attach bath mats securely with double-sided non-slip rug tape.  Do not have throw rugs and other things on the floor that can make you trip. What can I do in the bedroom?  Use night  lights.  Make sure that you have a light by your bed that is easy to reach.  Do not use any sheets or blankets that are too big for your bed. They should not hang down onto the floor.  Have a firm chair that has side arms. You can use this for support while you get dressed.  Do not have throw rugs and other things on the floor that can make you trip. What can I do in the kitchen?  Clean up any spills right away.  Avoid walking on wet floors.  Keep items that you use a lot in easy-to-reach places.  If you need to reach something above you, use a strong step stool that has a grab bar.  Keep electrical cords out of the way.  Do not use floor polish or wax that makes floors slippery. If you must use wax, use non-skid floor wax.  Do not have throw rugs and other things on the floor that can make you trip. What can I do with my stairs?  Do not leave any items on the stairs.  Make sure that there are handrails on both sides of the stairs and use them. Fix handrails that are broken or loose. Make sure that handrails are as long as the stairways.  Check any carpeting to make sure that it is firmly attached to the stairs. Fix any   carpet that is loose or worn.  Avoid having throw rugs at the top or bottom of the stairs. If you do have throw rugs, attach them to the floor with carpet tape.  Make sure that you have a light switch at the top of the stairs and the bottom of the stairs. If you do not have them, ask someone to add them for you. What else can I do to help prevent falls?  Wear shoes that:  Do not have high heels.  Have rubber bottoms.  Are comfortable and fit you well.  Are closed at the toe. Do not wear sandals.  If you use a stepladder:  Make sure that it is fully opened. Do not climb a closed stepladder.  Make sure that both sides of the stepladder are locked into place.  Ask someone to hold it for you, if possible.  Clearly mark and make sure that you can  see:  Any grab bars or handrails.  First and last steps.  Where the edge of each step is.  Use tools that help you move around (mobility aids) if they are needed. These include:  Canes.  Walkers.  Scooters.  Crutches.  Turn on the lights when you go into a dark area. Replace any light bulbs as soon as they burn out.  Set up your furniture so you have a clear path. Avoid moving your furniture around.  If any of your floors are uneven, fix them.  If there are any pets around you, be aware of where they are.  Review your medicines with your doctor. Some medicines can make you feel dizzy. This can increase your chance of falling. Ask your doctor what other things that you can do to help prevent falls. This information is not intended to replace advice given to you by your health care provider. Make sure you discuss any questions you have with your health care provider. Document Released: 09/11/2009 Document Revised: 04/22/2016 Document Reviewed: 12/20/2014  2017 [employer]  Health Maintenance, Female Introduction Adopting a healthy lifestyle and getting preventive care can go a long way to promote health and wellness. Talk with your health care provider about what schedule of regular examinations is right for you. This is a good chance for you to check in with your provider about disease prevention and staying healthy. In between checkups, there are plenty of things you can do on your own. Experts have done a lot of research about which lifestyle changes and preventive measures are most likely to keep you healthy. Ask your health care provider for more information. Weight and diet Eat a healthy diet  Be sure to include plenty of vegetables, fruits, low-fat dairy products, and lean protein.  Do not eat a lot of foods high in solid fats, added sugars, or salt.  Get regular exercise. This is one of the most important things you can do for your health.  Most adults should exercise  for at least 150 minutes each week. The exercise should increase your heart rate and make you sweat (moderate-intensity exercise).  Most adults should also do strengthening exercises at least twice a week. This is in addition to the moderate-intensity exercise. Maintain a healthy weight  Body mass index (BMI) is a measurement that can be used to identify possible weight problems. It estimates body fat based on height and weight. Your health care provider can help determine your BMI and help you achieve or maintain a healthy weight.  For females 20 years of   age and older:  A BMI below 18.5 is considered underweight.  A BMI of 18.5 to 24.9 is normal.  A BMI of 25 to 29.9 is considered overweight.  A BMI of 30 and above is considered obese. Watch levels of cholesterol and blood lipids  You should start having your blood tested for lipids and cholesterol at 71 years of age, then have this test every 5 years.  You may need to have your cholesterol levels checked more often if:  Your lipid or cholesterol levels are high.  You are older than 71 years of age.  You are at high risk for heart disease. Cancer screening Lung Cancer  Lung cancer screening is recommended for adults 55-80 years old who are at high risk for lung cancer because of a history of smoking.  A yearly low-dose CT scan of the lungs is recommended for people who:  Currently smoke.  Have quit within the past 15 years.  Have at least a 30-pack-year history of smoking. A pack year is smoking an average of one pack of cigarettes a day for 1 year.  Yearly screening should continue until it has been 15 years since you quit.  Yearly screening should stop if you develop a health problem that would prevent you from having lung cancer treatment. Breast Cancer  Practice breast self-awareness. This means understanding how your breasts normally appear and feel.  It also means doing regular breast self-exams. Let your health  care provider know about any changes, no matter how small.  If you are in your 20s or 30s, you should have a clinical breast exam (CBE) by a health care provider every 1-3 years as part of a regular health exam.  If you are 40 or older, have a CBE every year. Also consider having a breast X-ray (mammogram) every year.  If you have a family history of breast cancer, talk to your health care provider about genetic screening.  If you are at high risk for breast cancer, talk to your health care provider about having an MRI and a mammogram every year.  Breast cancer gene (BRCA) assessment is recommended for women who have family members with BRCA-related cancers. BRCA-related cancers include:  Breast.  Ovarian.  Tubal.  Peritoneal cancers.  Results of the assessment will determine the need for genetic counseling and BRCA1 and BRCA2 testing. Cervical Cancer  Your health care provider may recommend that you be screened regularly for cancer of the pelvic organs (ovaries, uterus, and vagina). This screening involves a pelvic examination, including checking for microscopic changes to the surface of your cervix (Pap test). You may be encouraged to have this screening done every 3 years, beginning at age 21.  For women ages 30-65, health care providers may recommend pelvic exams and Pap testing every 3 years, or they may recommend the Pap and pelvic exam, combined with testing for human papilloma virus (HPV), every 5 years. Some types of HPV increase your risk of cervical cancer. Testing for HPV may also be done on women of any age with unclear Pap test results.  Other health care providers may not recommend any screening for nonpregnant women who are considered low risk for pelvic cancer and who do not have symptoms. Ask your health care provider if a screening pelvic exam is right for you.  If you have had past treatment for cervical cancer or a condition that could lead to cancer, you need Pap  tests and screening for cancer for at least   least 20 years after your treatment. If Pap tests have been discontinued, your risk factors (such as having a new sexual partner) need to be reassessed to determine if screening should resume. Some women have medical problems that increase the chance of getting cervical cancer. In these cases, your health care provider may recommend more frequent screening and Pap tests. Colorectal Cancer  This type of cancer can be detected and often prevented.  Routine colorectal cancer screening usually begins at 71 years of age and continues through 71 years of age.  Your health care provider may recommend screening at an earlier age if you have risk factors for colon cancer.  Your health care provider may also recommend using home test kits to check for hidden blood in the stool.  A small camera at the end of a tube can be used to examine your colon directly (sigmoidoscopy or colonoscopy). This is done to check for the earliest forms of colorectal cancer.  Routine screening usually begins at age 30.  Direct examination of the colon should be repeated every 5-10 years through 71 years of age. However, you may need to be screened more often if early forms of precancerous polyps or small growths are found. Skin Cancer  Check your skin from head to toe regularly.  Tell your health care provider about any new moles or changes in moles, especially if there is a change in a mole's shape or color.  Also tell your health care provider if you have a mole that is larger than the size of a pencil eraser.  Always use sunscreen. Apply sunscreen liberally and repeatedly throughout the day.  Protect yourself by wearing long sleeves, pants, a wide-brimmed hat, and sunglasses whenever you are outside. Heart disease, diabetes, and high blood pressure  High blood pressure causes heart disease and increases the risk of stroke. High blood pressure is more likely to develop  in:  People who have blood pressure in the high end of the normal range (130-139/85-89 mm Hg).  People who are overweight or obese.  People who are African American.  If you are 15-89 years of age, have your blood pressure checked every 3-5 years. If you are 77 years of age or older, have your blood pressure checked every year. You should have your blood pressure measured twice-once when you are at a hospital or clinic, and once when you are not at a hospital or clinic. Record the average of the two measurements. To check your blood pressure when you are not at a hospital or clinic, you can use:  An automated blood pressure machine at a pharmacy.  A home blood pressure monitor.  If you are between 65 years and 65 years old, ask your health care provider if you should take aspirin to prevent strokes.  Have regular diabetes screenings. This involves taking a blood sample to check your fasting blood sugar level.  If you are at a normal weight and have a low risk for diabetes, have this test once every three years after 71 years of age.  If you are overweight and have a high risk for diabetes, consider being tested at a younger age or more often. Preventing infection Hepatitis B  If you have a higher risk for hepatitis B, you should be screened for this virus. You are considered at high risk for hepatitis B if:  You were born in a country where hepatitis B is common. Ask your health care provider which countries are considered high  risk.  Your parents were born in a high-risk country, and you have not been immunized against hepatitis B (hepatitis B vaccine).  You have HIV or AIDS.  You use needles to inject street drugs.  You live with someone who has hepatitis B.  You have had sex with someone who has hepatitis B.  You get hemodialysis treatment.  You take certain medicines for conditions, including cancer, organ transplantation, and autoimmune conditions. Hepatitis C  Blood  testing is recommended for:  Everyone born from [birth year] through 1966.  Anyone with known risk factors for hepatitis C. Sexually transmitted infections (STIs)  You should be screened for sexually transmitted infections (STIs) including gonorrhea and chlamydia if:  You are sexually active and are younger than 71 years of age.  You are older than 71 years of age and your health care provider tells you that you are at risk for this type of infection.  Your sexual activity has changed since you were last screened and you are at an increased risk for chlamydia or gonorrhea. Ask your health care provider if you are at risk.  If you do not have HIV, but are at risk, it may be recommended that you take a prescription medicine daily to prevent HIV infection. This is called pre-exposure prophylaxis (PrEP). You are considered at risk if:  You are sexually active and do not regularly use condoms or know the HIV status of your partner(s).  You take drugs by injection.  You are sexually active with a partner who has HIV. Talk with your health care provider about whether you are at high risk of being infected with HIV. If you choose to begin PrEP, you should first be tested for HIV. You should then be tested every 3 months for as long as you are taking PrEP. Pregnancy  If you are premenopausal and you may become pregnant, ask your health care provider about preconception counseling.  If you may become pregnant, take 400 to 800 micrograms (mcg) of folic acid every day.  If you want to prevent pregnancy, talk to your health care provider about birth control (contraception). Osteoporosis and menopause  Osteoporosis is a disease in which the bones lose minerals and strength with aging. This can result in serious bone fractures. Your risk for osteoporosis can be identified using a bone density scan.  If you are 62 years of age or older, or if you are at risk for osteoporosis and fractures, ask your health  care provider if you should be screened.  Ask your health care provider whether you should take a calcium or vitamin D supplement to lower your risk for osteoporosis.  Menopause may have certain physical symptoms and risks.  Hormone replacement therapy may reduce some of these symptoms and risks. Talk to your health care provider about whether hormone replacement therapy is right for you. Follow these instructions at home:  Schedule regular health, dental, and eye exams.  Stay current with your immunizations.  Do not use any tobacco products including cigarettes, chewing tobacco, or electronic cigarettes.  If you are pregnant, do not drink alcohol.  If you are breastfeeding, limit how much and how often you drink alcohol.  Limit alcohol intake to no more than 1 drink per day for nonpregnant women. One drink equals 12 ounces of beer, 5 ounces of wine, or 1 ounces of hard liquor.  Do not use street drugs.  Do not share needles.  Ask your health care provider for help if you need  information about quitting drugs.  Tell your health care provider if you often feel depressed.  Tell your health care provider if you have ever been abused or do not feel safe at home. This information is not intended to replace advice given to you by your health care provider. Make sure you discuss any questions you have with your health care provider. Document Released: 05/31/2011 Document Revised: 04/22/2016 Document Reviewed: 08/19/2015  2017 [employer]  

## 2016-11-25 NOTE — Progress Notes (Signed)
Pre visit review using our clinic review tool, if applicable. No additional management support is needed unless otherwise documented below in the visit note. 

## 2016-11-25 NOTE — Progress Notes (Signed)
Medical screening examination/treatment/procedure(s) were performed by non-physician practitioner and as supervising physician I was immediately available for consultation/collaboration. I agree with above. Katessa Attridge A Marcheta Horsey, MD 

## 2016-12-10 ENCOUNTER — Telehealth: Payer: Self-pay

## 2016-12-10 MED ORDER — PAROXETINE HCL 10 MG PO TABS: 10.0000 mg | ORAL_TABLET | Freq: Every day | ORAL | 0 refills | Status: DC

## 2016-12-10 NOTE — Telephone Encounter (Signed)
Refill for paxil sent in to [facility]

## 2017-01-05 ENCOUNTER — Other Ambulatory Visit: Payer: Self-pay | Admitting: Internal Medicine

## 2017-02-13 ENCOUNTER — Other Ambulatory Visit: Payer: Self-pay | Admitting: Internal Medicine

## 2017-02-18 IMAGING — CT CT ABD-PELV W/O CM
3 of 4 series · 11 of 36 positions shown, 17 images · non-contrast
Comparison: 02/20/2012

CLINICAL DATA: Intermittent mid and left lower quadrant abdominal
pain for the past 3 years. History of kidney stones.

EXAM:
CT ABDOMEN AND PELVIS WITHOUT CONTRAST
TECHNIQUE: Multidetector CT imaging of the abdomen and pelvis was performed
following the standard protocol without IV contrast.

[Series 3: abd/pelvis w/o · axial · non-contrast · 0.78mm/px · z∈[-306,-30]mm · 7 of 75 slices shown, 12 images]
[im 10/75  soft-tissue]
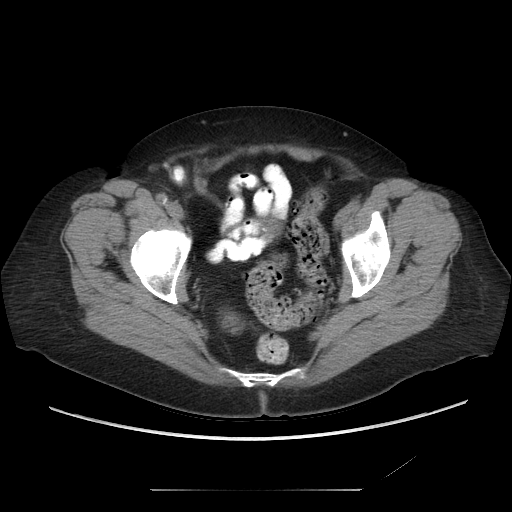
[im 10/75  bone]
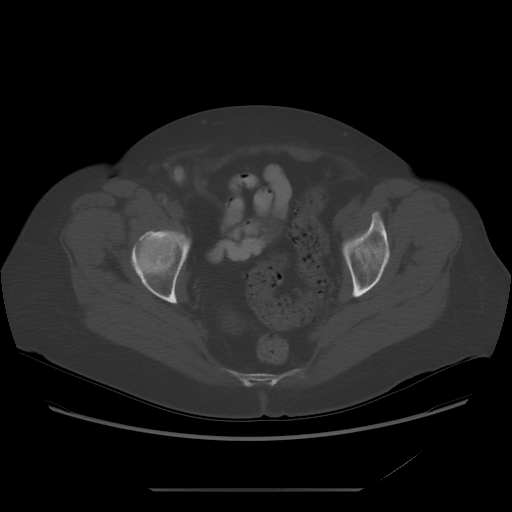
[im 19/75  soft-tissue]
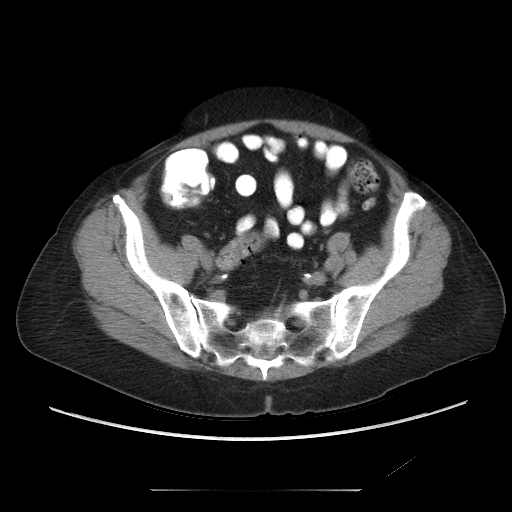
[im 28/75  soft-tissue]
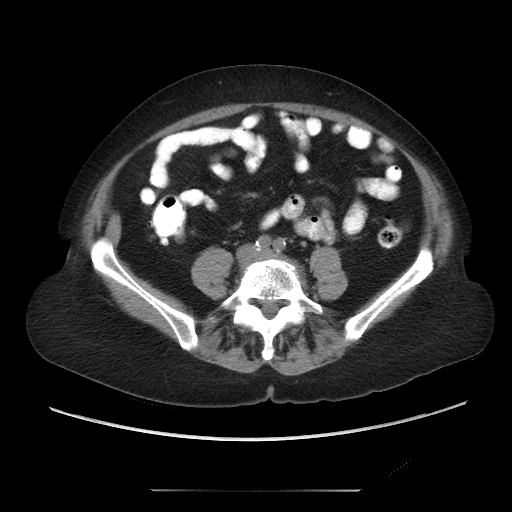
[im 38/75  soft-tissue]
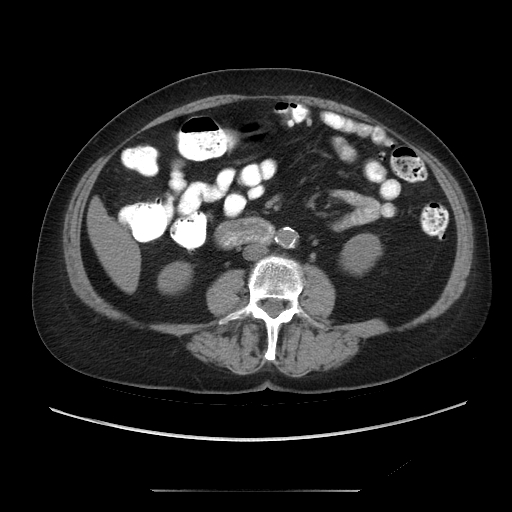
[im 38/75  lung]
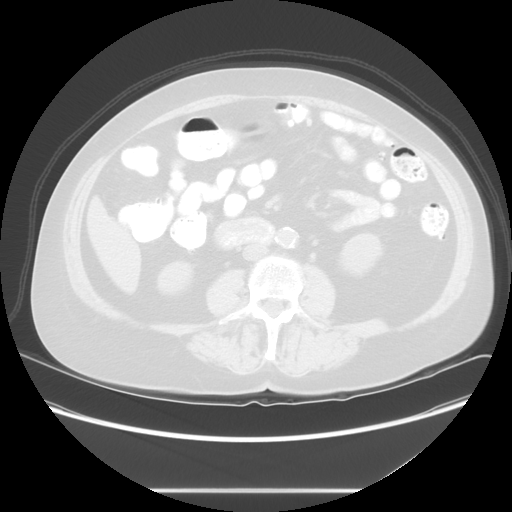
[im 47/75  soft-tissue]
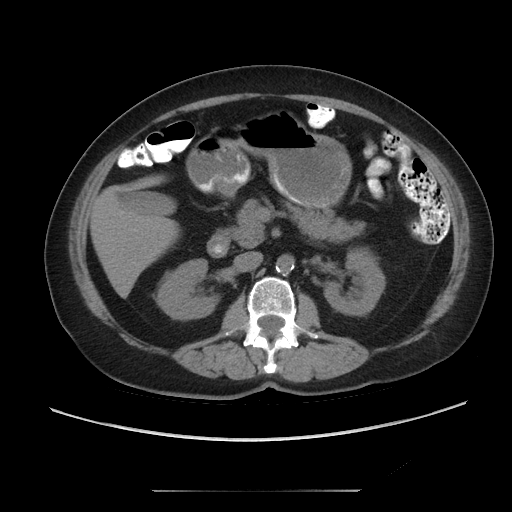
[im 47/75  lung]
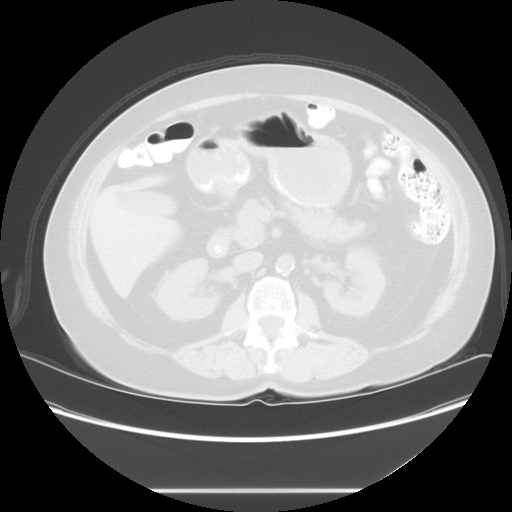
[im 56/75  soft-tissue]
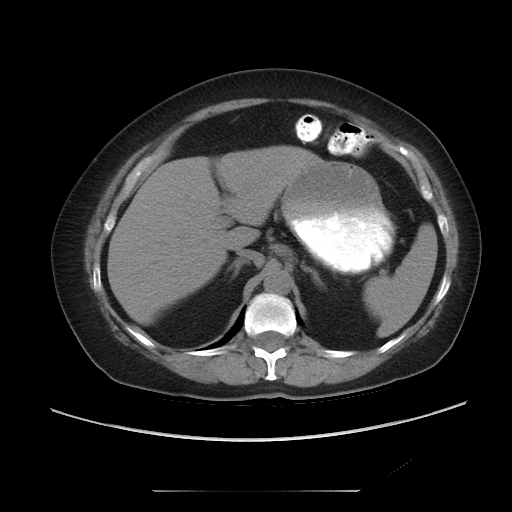
[im 56/75  lung]
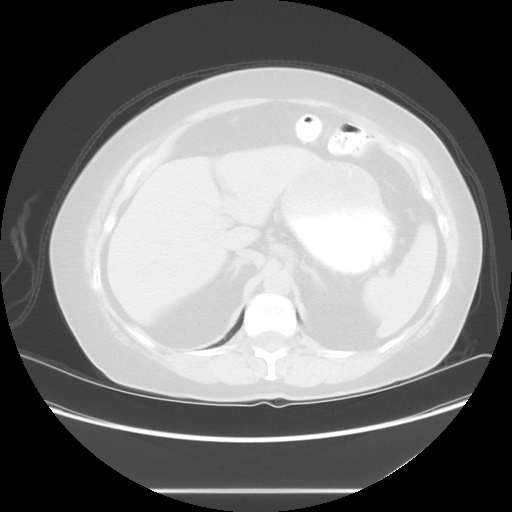
[im 65/75  soft-tissue]
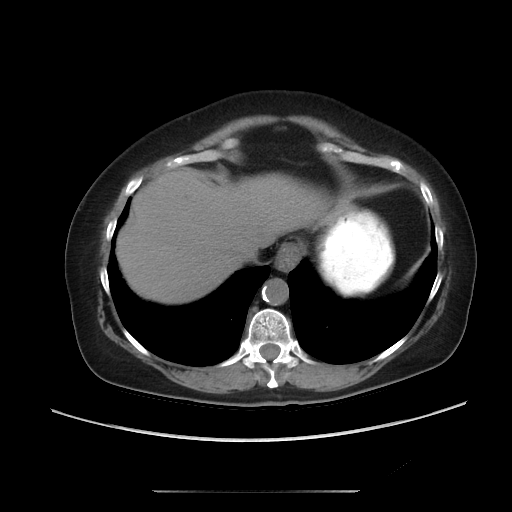
[im 65/75  lung]
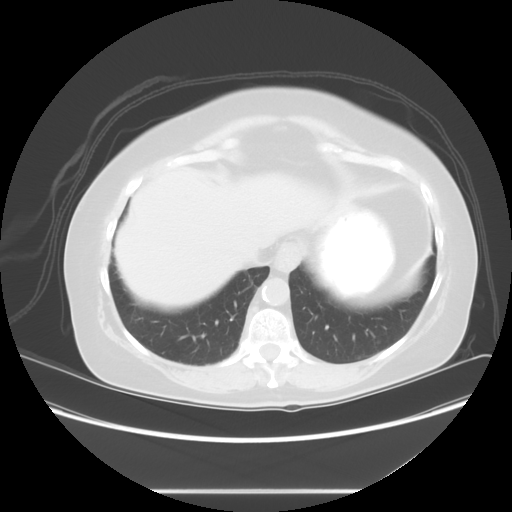

[Series 601: coronal body · coronal · 0.83mm/px · 1 of 113 slices shown, 2 images]
[im 38/113  soft-tissue]
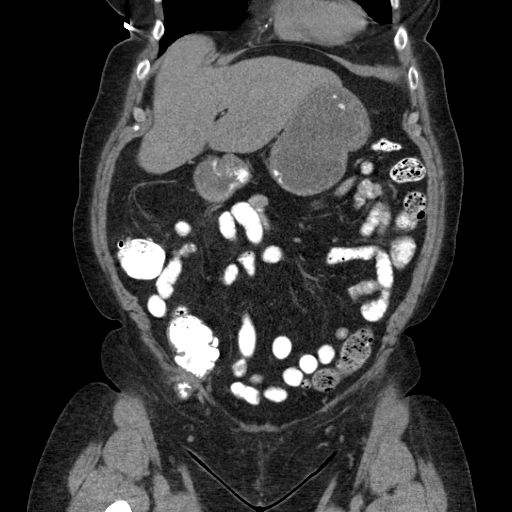
[im 38/113  bone]
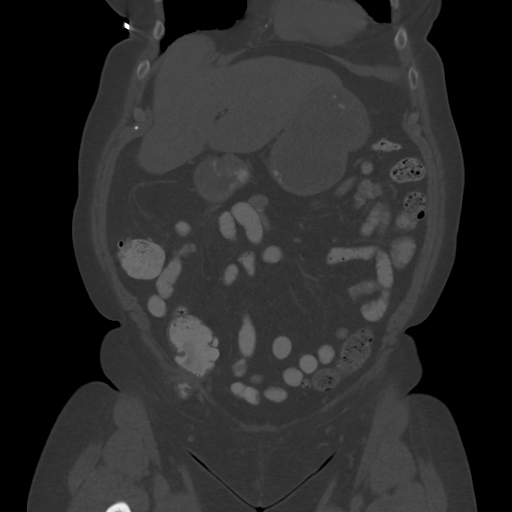

[Series 602: sagittal body · sagittal · 0.83mm/px · 3 of 160 slices shown]
[im 17/160  soft-tissue]
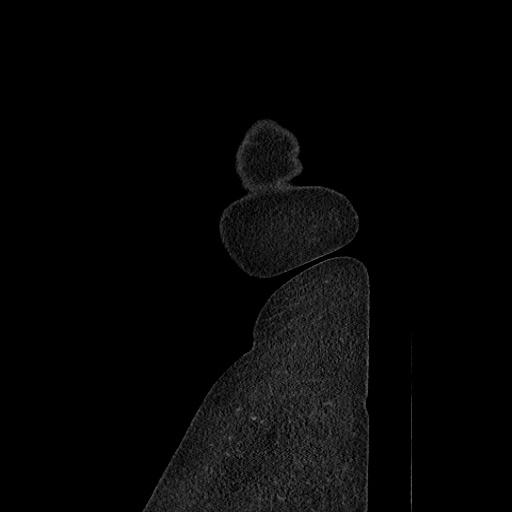
[im 34/160  soft-tissue]
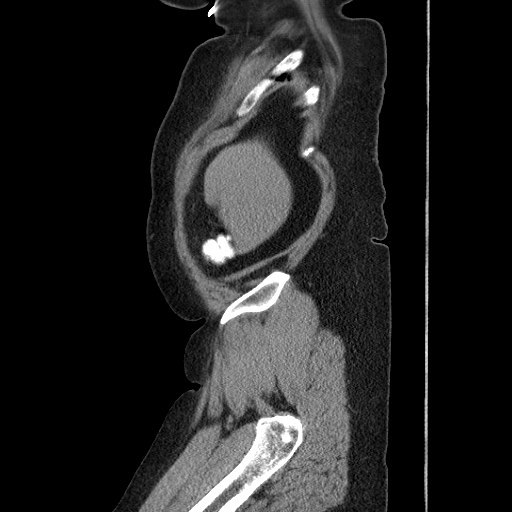
[im 51/160  soft-tissue]
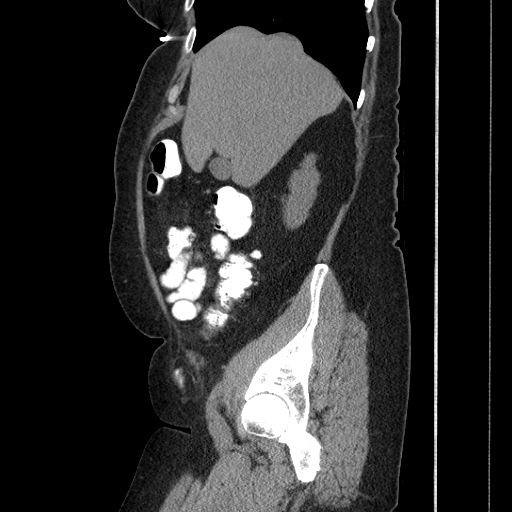

[11 of 36 positions shown; findings below may reference images not displayed]

FINDINGS: Lower chest: Limited visualization of the lower thorax demonstrates
minimal dependent subsegmental ground-glass atelectasis, right
greater than left. There is minimal ground-glass atelectasis within
the bilateral costophrenic angles. No discrete focal airspace
opacities. No pleural effusion.

Normal heart size.  No pericardial effusion.

Hepatobiliary: Normal hepatic contour. Scattered punctate sub cm
hypo attenuating hepatic lesions are too small likely characterize
of favored to represent hepatic cysts. Radiopaque material is seen
within the fundus and otherwise normal-appearing gallbladder. No
ascites.

Pancreas: Normal noncontrast appearance of the pancreas

Spleen: Normal noncontrast appearance of the spleen. No evidence
splenomegaly.

Adrenals/Urinary Tract: Punctate (approximately 3 mm) nonobstructing
stone within the inferior pole of the right kidney. No left-sided
renal stones. No renal stones are seen along the expected course of
either ureter or the urinary bladder. Normal noncontrast appearance
of the urinary bladder given degree distention. No urinary
obstruction or perinephric stranding. Normal noncontrast appearance
of the bilateral adrenal glands.

Stomach/Bowel: Ingested enteric contrast extends to the level of the
rectum. Extensive colonic diverticulosis, involving the cecum,
ascending and sigmoid colon, without evidence of superimposed acute
diverticulitis on this noncontrast examination. The tip of the cecum
and appendix are incidentally noted to be contained within a
right-sided indirect inguinal hernia (representative axial images 62
No associated bowel wall thickening or periappendiceal stranding.
Normal noncontrast appearance of the terminal ileum. No
pneumoperitoneum, pneumatosis or portal venous gas.

Vascular/Lymphatic: Moderate amount of slightly irregular
predominantly calcified atherosclerotic plaque within a tortuous but
normal caliber abdominal aorta. No bulky retroperitoneal
lymphadenopathy on this noncontrast examination.

Reproductive: Normal appearance of the pelvic organs for age. No
discrete adnexal lesion. No free fluid in the pelvic cul-de-sac.

Other: Regional soft tissues appear normal.

Musculoskeletal: Mild (approximately 3 mm) of anterolisthesis of L4
upon L5 without associated pars defects. Moderate DDD of L4-L5 with
disc space height loss, endplate irregularity and sclerosis.
IMPRESSION: 1. Extensive colonic diverticulosis without evidence of
diverticulitis. Otherwise, no explanation for patient's intermittent
mid and left lower quadrant abdominal pain.
2. Solitary punctate (approximately 3 mm) nonobstructing stone
within the inferior pole of the right kidney. No evidence of
left-sided nephrolithiasis.
3. The tip of the cecum as well as the appendix are noted to be
contained within a right-sided indirect inguinal hernia (Amyand
hernia), new when compared to remote abdominal CT performed [DATE].
No current evidence of incarceration or strangulation, though non
emergent surgical consultation is advised.

## 2017-03-01 ENCOUNTER — Other Ambulatory Visit (INDEPENDENT_AMBULATORY_CARE_PROVIDER_SITE_OTHER): Payer: Medicare Other

## 2017-03-01 ENCOUNTER — Encounter: Payer: Self-pay | Admitting: Internal Medicine

## 2017-03-01 ENCOUNTER — Ambulatory Visit (INDEPENDENT_AMBULATORY_CARE_PROVIDER_SITE_OTHER): Payer: Medicare Other | Admitting: Internal Medicine

## 2017-03-01 VITALS — BP 136/76 | HR 85 | Temp 97.6°F | Resp 12 | Ht 60.0 in | Wt 145.0 lb

## 2017-03-01 DIAGNOSIS — M1711 Unilateral primary osteoarthritis, right knee: Secondary | ICD-10-CM

## 2017-03-01 DIAGNOSIS — E039 Hypothyroidism, unspecified: Secondary | ICD-10-CM

## 2017-03-01 DIAGNOSIS — I10 Essential (primary) hypertension: Secondary | ICD-10-CM | POA: Diagnosis not present

## 2017-03-01 DIAGNOSIS — F4321 Adjustment disorder with depressed mood: Secondary | ICD-10-CM

## 2017-03-01 DIAGNOSIS — E785 Hyperlipidemia, unspecified: Secondary | ICD-10-CM | POA: Diagnosis not present

## 2017-03-01 LAB — CBC
HCT: 38.9 % (ref 36.0–46.0)
Hemoglobin: 13.1 g/dL (ref 12.0–15.0)
MCHC: 33.7 g/dL (ref 30.0–36.0)
MCV: 89.9 fl (ref 78.0–100.0)
Platelets: 312 10*3/uL (ref 150.0–400.0)
RBC: 4.33 Mil/uL (ref 3.87–5.11)
RDW: 13.4 % (ref 11.5–15.5)
WBC: 7.9 10*3/uL (ref 4.0–10.5)

## 2017-03-01 LAB — COMPREHENSIVE METABOLIC PANEL
ALBUMIN: 4.1 g/dL (ref 3.5–5.2)
ALK PHOS: 70 U/L (ref 39–117)
ALT: 23 U/L (ref 0–35)
AST: 23 U/L (ref 0–37)
BILIRUBIN TOTAL: 0.4 mg/dL (ref 0.2–1.2)
BUN: 18 mg/dL (ref 6–23)
CO2: 27 mEq/L (ref 19–32)
Calcium: 9.7 mg/dL (ref 8.4–10.5)
Chloride: 105 mEq/L (ref 96–112)
Creatinine, Ser: 0.99 mg/dL (ref 0.40–1.20)
GFR: 58.6 mL/min — AB (ref >60.00)
GLUCOSE: 102 mg/dL — AB (ref 70–99)
Potassium: 4.3 mEq/L (ref 3.5–5.1)
Sodium: 141 mEq/L (ref 135–145)
TOTAL PROTEIN: 6.7 g/dL (ref 6.0–8.3)

## 2017-03-01 LAB — LIPID PANEL
CHOLESTEROL: 211 mg/dL — AB (ref 0–200)
HDL: 50.6 mg/dL (ref >39.00)
LDL Cholesterol: 129 mg/dL — ABNORMAL HIGH (ref 0–99)
NONHDL: 160.15
Total CHOL/HDL Ratio: 4
Triglycerides: 156 mg/dL — ABNORMAL HIGH (ref 0.0–149.0)
VLDL: 31.2 mg/dL (ref 0.0–40.0)

## 2017-03-01 LAB — TSH: TSH: 4.02 u[IU]/mL (ref 0.35–4.50)

## 2017-03-01 MED ORDER — DICLOFENAC SODIUM 1 % TD GEL: 2.0000 g | Freq: Four times a day (QID) | TRANSDERMAL | 3 refills | Status: DC

## 2017-03-01 NOTE — Assessment & Plan Note (Signed)
Taking paxil 10 mg daily and no adjustments needed today. She would like to continue while still going through the adjustment of her parents moving to [facility] and going through all the stuff and selling house. Maybe next year if going well can try to stop.

## 2017-03-01 NOTE — Progress Notes (Signed)
Pre visit review using our clinic review tool, if applicable. No additional management support is needed unless otherwise documented below in the visit note. 

## 2017-03-01 NOTE — Assessment & Plan Note (Signed)
Controlled off meds for now, continue to monitor BP when she comes in.

## 2017-03-01 NOTE — Progress Notes (Signed)
   Subjective:    Patient ID: Tiffany House, female    DOB: [birth year], 71 y.o.   MRN: [id number]  HPI  Here for follow up of her thyroid (taking synthroid 25 mcg daily, denies cold or heat intolerance, denies tremors, no dry skin or weight gain), depression (taking paxil and doing well, adjusting to her parents moving to ALF, she is working through going through the house and selling and moving elsewhere, denies SI/HI), and cholesterol (taking zocor 40 mg daily, no side effects, denies chest pains) and also new problem of knee pain (fell in the driveway due to taking out the trash, it was too heavy and pulled her down a slight hill, denies serious injury but lingering pain in her right knee, out of voltaren cream, has used tylenol with good temporary relief, denies instability or swelling, no skin tearing or bruising anymore, happened about 2 weeks ago).   Review of Systems  Constitutional: Positive for activity change. Negative for appetite change, chills, fatigue, fever and unexpected weight change.  HENT: Negative.   Eyes: Negative.   Respiratory: Negative.   Cardiovascular: Negative.   Gastrointestinal: Negative.   Musculoskeletal: Positive for arthralgias and myalgias. Negative for back pain, gait problem, joint swelling, neck pain and neck stiffness.  Skin: Negative.   Neurological: Negative for dizziness, tremors, syncope, weakness, light-headedness, numbness and headaches.  Psychiatric/Behavioral: Negative for behavioral problems, decreased concentration, dysphoric mood, hallucinations, self-injury and suicidal ideas. The patient is not nervous/anxious.       Objective:   Physical Exam  Constitutional: She is oriented to person, place, and time. She appears well-developed and well-nourished.  HENT:  Head: Normocephalic and atraumatic.  Right Ear: External ear normal.  Left Ear: External ear normal.  Eyes: EOM are normal.  Neck: Normal range of motion. No JVD present.    Cardiovascular: Normal rate and regular rhythm.   Pulmonary/Chest: Effort normal and breath sounds normal.  Abdominal: Soft. Bowel sounds are normal. She exhibits no distension. There is no tenderness. There is no rebound.  Musculoskeletal: She exhibits tenderness. She exhibits no edema.  Pain in the lower knee, no tenderness along the lateral or medial collateral ligament, ACL and PCL intact right knee, no swelling or effusion.   Lymphadenopathy:    She has no cervical adenopathy.  Neurological: She is alert and oriented to person, place, and time.  Skin: Skin is warm and dry.  Psychiatric: She has a normal mood and affect.   Vitals:   03/01/17 0918  BP: 136/76  Pulse: 85  Resp: 12  Temp: 97.6 F (36.4 C)  TempSrc: Oral  SpO2: 98%  Weight: 145 lb (65.8 kg)  Height: 5' (1.524 m)      Assessment & Plan:

## 2017-03-01 NOTE — Assessment & Plan Note (Signed)
Checking TSH and adjust synthroid 25 mcg daily as needed.  ?

## 2017-03-01 NOTE — Assessment & Plan Note (Addendum)
Checking lipid panel and adjust as needed her zocor 40 mg daily. Goal LDL <130

## 2017-03-01 NOTE — Assessment & Plan Note (Signed)
Rx for voltaren gel today and if no improvement recommended return to sports medicine for knee injection. No signs of ligament tear on exam today and will likely improve with time.

## 2017-03-01 NOTE — Patient Instructions (Signed)
We have sent in the refills of the voltaren gel (generic) for the knee. Call and make a visit with Dr. Katrinka Blazing if you need the injection in the knee.   We are checking the labs today and will call you back with the results.

## 2017-03-16 ENCOUNTER — Other Ambulatory Visit: Payer: Self-pay | Admitting: Internal Medicine

## 2017-05-23 ENCOUNTER — Other Ambulatory Visit: Payer: Self-pay | Admitting: Internal Medicine

## 2017-07-06 ENCOUNTER — Other Ambulatory Visit: Payer: Self-pay | Admitting: Internal Medicine

## 2017-07-06 ENCOUNTER — Ambulatory Visit (INDEPENDENT_AMBULATORY_CARE_PROVIDER_SITE_OTHER): Payer: Medicare Other

## 2017-07-06 ENCOUNTER — Encounter: Payer: Self-pay | Admitting: Podiatry

## 2017-07-06 ENCOUNTER — Other Ambulatory Visit: Payer: Self-pay | Admitting: Podiatry

## 2017-07-06 ENCOUNTER — Ambulatory Visit (INDEPENDENT_AMBULATORY_CARE_PROVIDER_SITE_OTHER): Payer: Medicare Other | Admitting: Podiatry

## 2017-07-06 DIAGNOSIS — M79671 Pain in right foot: Secondary | ICD-10-CM | POA: Diagnosis not present

## 2017-07-06 DIAGNOSIS — M779 Enthesopathy, unspecified: Secondary | ICD-10-CM

## 2017-07-06 DIAGNOSIS — M7751 Other enthesopathy of right foot: Secondary | ICD-10-CM

## 2017-07-06 DIAGNOSIS — M778 Other enthesopathies, not elsewhere classified: Secondary | ICD-10-CM

## 2017-07-06 MED ORDER — TRIAMCINOLONE ACETONIDE 10 MG/ML IJ SUSP
10.0000 mg | Freq: Once | INTRAMUSCULAR | Status: AC
  Administered 2017-07-06: 10 mg

## 2017-07-06 NOTE — Progress Notes (Signed)
Subjective:    Patient ID: Tiffany House, female   DOB: 71 y.o.   MRN: [id number]   HPI patient presents with pain in the right foot of several months duration. States it's been going on and getting worse gradually over time and making it hard for her to be comfortable    Review of Systems  All other systems reviewed and are negative.       Objective:  Physical Exam  Constitutional: She appears well-developed and well-nourished.  Cardiovascular: Intact distal pulses.   Neurological: She is alert.  Skin: Skin is warm.  Nursing note and vitals reviewed.  neurovascular status found to be intact muscle strength was adequate range of motion within normal limits with patient noted to have acute inflammation around the second MPJ right with fluid buildup around the joint and mild dislocation of the toe. There is found to be good digital perfusion and well oriented 3     Assessment:  Inflammatory capsulitis second MPJ right with inflammation fluid and possibility for flexor plate stretch       Plan:    H&P conditions reviewed and acute nature of condition proximal nerve block administered the joint was aspirated with a small amount of clear fluid and a quarter cc deck some Kenalog was put into the joint along with padding. Reappoint to recheck next several weeks. Also orthotics will be made for this patient and I've recommended a depression around the second metatarsal right where there is prominence of the metatarsal  X-rays were negative for signs of stress fracture with mild dislocation of the toe but not significant

## 2017-07-06 NOTE — Progress Notes (Signed)
   Subjective:    Patient ID: Tiffany House, female    DOB: [birth year], 71 y.o.   MRN: [id number]  HPI  Chief Complaint  Patient presents with  . Foot Pain    pain ball of foot when she stands on it 1 yr       Review of Systems  HENT: Positive for hearing loss.   Musculoskeletal: Positive for arthralgias and back pain.  Allergic/Immunologic: Positive for food allergies.       Objective:   Physical Exam        Assessment & Plan:

## 2017-07-11 ENCOUNTER — Ambulatory Visit: Payer: Self-pay | Admitting: Orthotics

## 2017-07-11 DIAGNOSIS — M779 Enthesopathy, unspecified: Secondary | ICD-10-CM

## 2017-07-11 NOTE — Progress Notes (Signed)
Patient came into today to be cast for Custom Foot Orthotics. Upon recommendation of Dr. Charlsie Merlesegal Patient presents with pes cavus, 2nd R MPJ capsulitis Goals are arch support, and to offload 2nd MPJ. Patient advised of cost $300.

## 2017-08-02 ENCOUNTER — Ambulatory Visit (INDEPENDENT_AMBULATORY_CARE_PROVIDER_SITE_OTHER): Payer: Self-pay | Admitting: Orthotics

## 2017-08-02 DIAGNOSIS — M779 Enthesopathy, unspecified: Secondary | ICD-10-CM

## 2017-08-02 NOTE — Progress Notes (Signed)
Patient came in today to p/up functional foot orthotics.   The orthotics were assessed to both fit and function.  The F/O addressed the biomechanical issues/pathologies as intended, offering good longitudinal arch support, proper offloading, and foot support. There weren't any signs of discomfort or irritation.  The F/O fit properly in footwear with minimal trimming/adjustments. 

## 2017-08-04 ENCOUNTER — Other Ambulatory Visit: Payer: Self-pay | Admitting: Internal Medicine

## 2017-09-02 DIAGNOSIS — Z23 Encounter for immunization: Secondary | ICD-10-CM | POA: Diagnosis not present

## 2017-10-07 ENCOUNTER — Other Ambulatory Visit: Payer: Self-pay | Admitting: Internal Medicine

## 2017-11-18 ENCOUNTER — Telehealth: Payer: Self-pay | Admitting: Internal Medicine

## 2017-11-18 NOTE — Telephone Encounter (Signed)
Attempted to call patient to schedule AWV for 2019. Patient's phone was busy. Will try to contact patient at a later time. SF

## 2017-12-01 ENCOUNTER — Encounter: Payer: Self-pay | Admitting: Internal Medicine

## 2017-12-01 ENCOUNTER — Ambulatory Visit (INDEPENDENT_AMBULATORY_CARE_PROVIDER_SITE_OTHER): Payer: Medicare HMO | Admitting: Internal Medicine

## 2017-12-01 ENCOUNTER — Other Ambulatory Visit (INDEPENDENT_AMBULATORY_CARE_PROVIDER_SITE_OTHER): Payer: Medicare HMO

## 2017-12-01 VITALS — BP 138/90 | HR 75 | Temp 98.1°F | Ht 60.0 in | Wt 149.0 lb

## 2017-12-01 DIAGNOSIS — E039 Hypothyroidism, unspecified: Secondary | ICD-10-CM | POA: Diagnosis not present

## 2017-12-01 DIAGNOSIS — R252 Cramp and spasm: Secondary | ICD-10-CM

## 2017-12-01 DIAGNOSIS — R05 Cough: Secondary | ICD-10-CM | POA: Diagnosis not present

## 2017-12-01 DIAGNOSIS — R059 Cough, unspecified: Secondary | ICD-10-CM

## 2017-12-01 LAB — MAGNESIUM: MAGNESIUM: 2.3 mg/dL (ref 1.5–2.5)

## 2017-12-01 LAB — COMPREHENSIVE METABOLIC PANEL
ALBUMIN: 4.5 g/dL (ref 3.5–5.2)
ALK PHOS: 76 U/L (ref 39–117)
ALT: 30 U/L (ref 0–35)
AST: 25 U/L (ref 0–37)
BUN: 22 mg/dL (ref 6–23)
CALCIUM: 9.8 mg/dL (ref 8.4–10.5)
CO2: 28 mEq/L (ref 19–32)
CREATININE: 1.04 mg/dL (ref 0.40–1.20)
Chloride: 103 mEq/L (ref 96–112)
GFR: 55.24 mL/min — ABNORMAL LOW (ref >60.00)
Glucose, Bld: 90 mg/dL (ref 70–99)
Potassium: 3.9 mEq/L (ref 3.5–5.1)
SODIUM: 141 meq/L (ref 135–145)
TOTAL PROTEIN: 7.1 g/dL (ref 6.0–8.3)
Total Bilirubin: 0.6 mg/dL (ref 0.2–1.2)

## 2017-12-01 LAB — TSH: TSH: 5.02 u[IU]/mL — ABNORMAL HIGH (ref 0.35–4.50)

## 2017-12-01 MED ORDER — LEVOTHYROXINE SODIUM 25 MCG PO TABS: ORAL_TABLET | ORAL | 3 refills | Status: DC

## 2017-12-01 MED ORDER — OMEPRAZOLE 40 MG PO CPDR: 40.0000 mg | DELAYED_RELEASE_CAPSULE | Freq: Every day | ORAL | 3 refills | Status: DC

## 2017-12-01 MED ORDER — SIMVASTATIN 20 MG PO TABS: 20.0000 mg | ORAL_TABLET | Freq: Every day | ORAL | 3 refills | Status: DC

## 2017-12-01 MED ORDER — PAROXETINE HCL 10 MG PO TABS: 10.0000 mg | ORAL_TABLET | Freq: Every day | ORAL | 3 refills | Status: DC

## 2017-12-01 NOTE — Patient Instructions (Signed)
Think about using a humidifier in your room.  We will check the labs, think about trying a magnesium pill to help.

## 2017-12-01 NOTE — Progress Notes (Signed)
   Subjective:    Patient ID: Tiffany House, female    DOB: [birth year], 72 y.o.   MRN: [id number]  HPI The patient is a 72 YO female coming in for cough going on for some time. She gets this every winter and this year it is worse than others. She denies SOB. She can get some nose congestion and drainage. Overall stable to mildly improved since onset. She has not tried anything but mucinex for it. She is also having muscle cramps which bother her more. She gets cramps in her toes at random times. Sometimes at night time more. She is concerned that it may happen while she is driving. Worse with certain motions and activities. Overall 1-2 times per week. She is very stressed right now due to just buying a town home and selling her house of 20+ years that she shared with her parents for a long time while they were living with her and she was their caretaker. This is making her more stressed and she needs a refill on her medication for mood. She is almost out. The holidays have been difficult for her and this past year has been very difficult.   Review of Systems  Constitutional: Negative.   HENT: Positive for congestion.   Eyes: Negative.   Respiratory: Positive for cough. Negative for chest tightness and shortness of breath.   Cardiovascular: Negative for chest pain, palpitations and leg swelling.  Gastrointestinal: Negative for abdominal distention, abdominal pain, constipation, diarrhea, nausea and vomiting.  Musculoskeletal: Positive for myalgias.  Skin: Negative.   Neurological: Negative.   Psychiatric/Behavioral: Positive for dysphoric mood. The patient is nervous/anxious.       Objective:   Physical Exam  Constitutional: She is oriented to person, place, and time. She appears well-developed and well-nourished.  HENT:  Head: Normocephalic and atraumatic.  Oropharynx with redness and clear drainage.   Eyes: EOM are normal.  Neck: Normal range of motion.  Cardiovascular: Normal rate and  regular rhythm.  Pulmonary/Chest: Effort normal and breath sounds normal. No respiratory distress. She has no wheezes. She has no rales.  Abdominal: Soft. Bowel sounds are normal. She exhibits no distension. There is no tenderness. There is no rebound.  Musculoskeletal: She exhibits no edema or tenderness.  Neurological: She is alert and oriented to person, place, and time. Coordination normal.  Skin: Skin is warm and dry.   Vitals:   12/01/17 1437  BP: 138/90  Pulse: 75  Temp: 98.1 F (36.7 C)  TempSrc: Oral  SpO2: 99%  Weight: 149 lb (67.6 kg)  Height: 5' (1.524 m)      Assessment & Plan:

## 2017-12-02 DIAGNOSIS — R252 Cramp and spasm: Secondary | ICD-10-CM | POA: Insufficient documentation

## 2017-12-02 NOTE — Assessment & Plan Note (Signed)
Checking CMP and magnesium for cause of the cramps. Advised to try calcium or magnesium supplement otc for prevention.

## 2017-12-02 NOTE — Assessment & Plan Note (Signed)
Checking TSH and adjust synthroid 25 mcg daily as needed.  ?

## 2017-12-02 NOTE — Assessment & Plan Note (Signed)
Suspect post nasal drip and encouraged her to resume the zyrtec she had been taking. She is also encouraged to use a humidifier in her room to help with dryness in the nasal passages.

## 2017-12-07 ENCOUNTER — Encounter: Payer: Self-pay | Admitting: Family

## 2017-12-07 ENCOUNTER — Ambulatory Visit (INDEPENDENT_AMBULATORY_CARE_PROVIDER_SITE_OTHER): Payer: Medicare HMO | Admitting: Family

## 2017-12-07 VITALS — BP 138/84 | HR 116 | Temp 98.6°F | Ht 60.0 in | Wt 149.0 lb

## 2017-12-07 DIAGNOSIS — J209 Acute bronchitis, unspecified: Secondary | ICD-10-CM | POA: Diagnosis not present

## 2017-12-07 MED ORDER — AZITHROMYCIN 250 MG PO TABS: ORAL_TABLET | ORAL | 0 refills | Status: DC

## 2017-12-07 MED ORDER — PROMETHAZINE-DM 6.25-15 MG/5ML PO SYRP: 5.0000 mL | ORAL_SOLUTION | Freq: Four times a day (QID) | ORAL | 0 refills | Status: DC | PRN

## 2017-12-07 NOTE — Progress Notes (Signed)
Tiffany BernardLeslie S House is a 72 y.o. female with the following history as recorded in [facility]:  Patient Active Problem List   Diagnosis Date Noted  . Muscle cramps 12/02/2017  . Adjustment disorder 06/05/2015  . Primary localized osteoarthrosis, lower leg 01/09/2014  . Hypothyroidism 11/02/2010  . Osteoporosis   . Hyperlipidemia 12/23/2008  . Essential hypertension 11/11/2008  . GERD 05/21/2008  . Cough 03/25/2008    Current Outpatient Medications  Medication Sig Dispense Refill  . calcium carbonate (CALCIUM 500) 1250 MG tablet Take 2 tablets by mouth daily.      . Cholecalciferol (VITAMIN D-3) 1000 UNITS CAPS Take by mouth. Take one tablet twice daily    . diclofenac sodium (VOLTAREN) 1 % GEL Apply 2 g topically 4 (four) times daily. 100 g 3  . fish oil-omega-3 fatty acids 1000 MG capsule Take 2 g by mouth 2 (two) times daily.    Marland Kitchen. glucosamine-chondroitin 500-400 MG tablet Take 1 tablet by mouth 2 (two) times daily.    Marland Kitchen. levothyroxine (SYNTHROID, LEVOTHROID) 25 MCG tablet TAKE 1 TABLET EVERY DAY 90 tablet 3  . Multiple Vitamin (MULTIVITAMIN) tablet Take 1 tablet by mouth daily.      . multivitamin-lutein (OCUVITE-LUTEIN) CAPS capsule Take 1 capsule by mouth daily.    Marland Kitchen. omeprazole (PRILOSEC) 40 MG capsule Take 1 capsule (40 mg total) by mouth daily. 90 capsule 3  . PARoxetine (PAXIL) 10 MG tablet Take 1 tablet (10 mg total) by mouth daily. 90 tablet 3  . psyllium (METAMUCIL) 58.6 % powder Take 1 packet by mouth 2 (two) times daily as needed.      . simvastatin (ZOCOR) 20 MG tablet Take 1 tablet (20 mg total) by mouth daily at 6 PM. 90 tablet 3  . azithromycin (ZITHROMAX) 250 MG tablet 2 tabs po qd x 1 day; 1 tablet per day x 4 days; 6 tablet 0  . promethazine-dextromethorphan (PROMETHAZINE-DM) 6.25-15 MG/5ML syrup Take 5 mLs by mouth 4 (four) times daily as needed for cough. 118 mL 0   No current facility-administered medications for this visit.     Allergies: Shellfish allergy and  Sulfonamide derivatives  Past Medical History:  Diagnosis Date  . Allergic rhinitis, cause unspecified   . Arthritis   . Asthma   . Benign positional vertigo   . GERD   . HYPERLIPIDEMIA   . Hypertension   . HYPOTHYROIDISM dx 10/2010  . Low back pain    MRI 02/2011  . Migraines   . OSTEOPENIA   . Osteoporosis    Qualifier: Diagnosis of  By: Nena JordanYoo DO, D. Robert   . RENAL CALCULUS, RIGHT    L 01/2012    Past Surgical History:  Procedure Laterality Date  . HAND SURGERY  2001   Dr Teressa SenterSypher-- surgery of right hand to rebuild joint  . HERNIA REPAIR  03/2016  . Kidney stone removed  01/12/10  . TUBAL LIGATION  1979    Family History  Problem Relation Age of Onset  . Hyperlipidemia Mother   . Hypertension Mother   . Cancer Father        prostate. [name]  . Coronary artery disease Father   . Hypertension Father     Social History   Tobacco Use  . Smoking status: Never Smoker  . Smokeless tobacco: Never Used  Substance Use Topics  . Alcohol use: Yes    Comment: occasional    Subjective:  Patient presents with 1 week history of cough/ congestion; feels  like cough has moved "deeper" and caused her to pull a muscle in right rib; + headache; cough is not as problemtaic at night once able to settled down; has been using OTC Alka-Seltzer Cold with benefit for nasal drainage; + sore throat; does not like to use nasal sprays;     Objective:  Vitals:   12/07/17 1520  BP: 138/84  Pulse: (!) 116  Temp: 98.6 F (37 C)  TempSrc: Oral  SpO2: 97%  Weight: 149 lb 0.6 oz (67.6 kg)  Height: 5' (1.524 m)    General: Well developed, well nourished, in no acute distress  Skin : Warm and dry.  Head: Normocephalic and atraumatic  Eyes: Sclera and conjunctiva clear; pupils round and reactive to light; extraocular movements intact  Ears: External normal; canals clear; tympanic membranes congested bilaterally Oropharynx: Pink, supple. No suspicious lesions  Neck: Supple without  thyromegaly, adenopathy  Lungs: Respirations unlabored; clear to auscultation bilaterally without wheeze, rales, rhonchi  CVS exam: normal rate and regular rhythm.  Neurologic: Alert and oriented; speech intact; face symmetrical; moves all extremities well; CNII-XII intact without focal deficit  Assessment:  1. Acute bronchitis, unspecified organism     Plan:  Reassurance; Rx for Z-pak and Promethazine-DM cough syrup since patient does not want nasal spray; increase fluids, rest and follow-up worse, no better.   No Follow-up on file.  No orders of the defined types were placed in this encounter.   Requested Prescriptions   Signed Prescriptions Disp Refills  . promethazine-dextromethorphan (PROMETHAZINE-DM) 6.25-15 MG/5ML syrup 118 mL 0    Sig: Take 5 mLs by mouth 4 (four) times daily as needed for cough.  Marland Kitchen azithromycin (ZITHROMAX) 250 MG tablet 6 tablet 0    Sig: 2 tabs po qd x 1 day; 1 tablet per day x 4 days;

## 2018-01-26 DIAGNOSIS — G8929 Other chronic pain: Secondary | ICD-10-CM | POA: Diagnosis not present

## 2018-01-26 DIAGNOSIS — R69 Illness, unspecified: Secondary | ICD-10-CM | POA: Diagnosis not present

## 2018-01-26 DIAGNOSIS — M1990 Unspecified osteoarthritis, unspecified site: Secondary | ICD-10-CM | POA: Diagnosis not present

## 2018-01-26 DIAGNOSIS — E785 Hyperlipidemia, unspecified: Secondary | ICD-10-CM | POA: Diagnosis not present

## 2018-01-26 DIAGNOSIS — E039 Hypothyroidism, unspecified: Secondary | ICD-10-CM | POA: Diagnosis not present

## 2018-01-26 DIAGNOSIS — Z8249 Family history of ischemic heart disease and other diseases of the circulatory system: Secondary | ICD-10-CM | POA: Diagnosis not present

## 2018-01-26 DIAGNOSIS — Z882 Allergy status to sulfonamides status: Secondary | ICD-10-CM | POA: Diagnosis not present

## 2018-01-26 DIAGNOSIS — R030 Elevated blood-pressure reading, without diagnosis of hypertension: Secondary | ICD-10-CM | POA: Diagnosis not present

## 2018-01-26 DIAGNOSIS — K219 Gastro-esophageal reflux disease without esophagitis: Secondary | ICD-10-CM | POA: Diagnosis not present

## 2018-01-26 DIAGNOSIS — Z809 Family history of malignant neoplasm, unspecified: Secondary | ICD-10-CM | POA: Diagnosis not present

## 2018-02-28 ENCOUNTER — Ambulatory Visit (INDEPENDENT_AMBULATORY_CARE_PROVIDER_SITE_OTHER): Payer: Medicare HMO | Admitting: *Deleted

## 2018-02-28 VITALS — BP 144/72 | HR 98 | Resp 18 | Ht 60.0 in | Wt 149.0 lb

## 2018-02-28 DIAGNOSIS — Z1231 Encounter for screening mammogram for malignant neoplasm of breast: Secondary | ICD-10-CM

## 2018-02-28 DIAGNOSIS — Z0000 Encounter for general adult medical examination without abnormal findings: Secondary | ICD-10-CM

## 2018-02-28 NOTE — Progress Notes (Signed)
Medical treatment/procedure(s) were performed by non-physician practitioner and as supervising physician I was immediately available for consultation/collaboration. I agree with above. Elizabeth A Crawford, MD  

## 2018-02-28 NOTE — Progress Notes (Signed)
Subjective:   Tiffany House is a 72 y.o. female who presents for Medicare Annual (Subsequent) preventive examination.  Review of Systems:  No ROS.  Medicare Wellness Visit. Additional risk factors are reflected in the social history.  Cardiac Risk Factors include: advanced age (>8men, >1 women);dyslipidemia;hypertension Sleep patterns: feels rested on waking, gets up 1 times nightly to void and sleeps 7-8 hours nightly.    Home Safety/Smoke Alarms: Feels safe in home. Smoke alarms in place.  Living environment; residence and Firearm Safety: 1-story house/ trailer, no firearms. Lives alone, no needs for DME, good support system Seat Belt Safety/Bike Helmet: Wears seat belt.     Objective:     Vitals: BP (!) 144/72   Pulse 98   Resp 18   Ht 5' (1.524 m)   Wt 149 lb (67.6 kg)   SpO2 98%   BMI 29.10 kg/m   Body mass index is 29.1 kg/m.  Advanced Directives 02/28/2018 11/25/2016  Does Patient Have a Medical Advance Directive? Yes Yes  Type of Estate agent of [place];Living will Healthcare Power of [place];Living will  Copy of Healthcare Power of Attorney in Chart? No - copy requested No - copy requested    Tobacco Social History   Tobacco Use  Smoking Status Never Smoker  Smokeless Tobacco Never Used     Counseling given: Not Answered  Past Medical History:  Diagnosis Date  . Allergic rhinitis, cause unspecified   . Arthritis   . Benign positional vertigo   . GERD   . HYPERLIPIDEMIA   . Hypertension   . HYPOTHYROIDISM dx 10/2010  . Low back pain    MRI 02/2011  . Migraines   . OSTEOPENIA   . Osteoporosis    Qualifier: Diagnosis of  By: Nena Jordan   . RENAL CALCULUS, RIGHT    L 01/2012   Past Surgical History:  Procedure Laterality Date  . HAND SURGERY  2001   Dr Teressa Senter-- surgery of right hand to rebuild joint  . HERNIA REPAIR  03/2016  . Kidney stone removed  01/12/10  . TUBAL LIGATION  1979   Family History  Problem  Relation Age of Onset  . Hyperlipidemia Mother   . Hypertension Mother   . Cancer Father        prostate. [name]  . Coronary artery disease Father   . Hypertension Father    Social History   Socioeconomic History  . Marital status: Widowed    Spouse name: Not on file  . Number of children: Not on file  . Years of education: Not on file  . Highest education level: Not on file  Occupational History  . Occupation: Copywriter, advertising: SELF EMPLOYED  Social Needs  . Financial resource strain: Not hard at all  . Food insecurity:    Worry: Never true    Inability: Never true  . Transportation needs:    Medical: No    Non-medical: No  Tobacco Use  . Smoking status: Never Smoker  . Smokeless tobacco: Never Used  Substance and Sexual Activity  . Alcohol use: Yes    Comment: occasional  . Drug use: No  . Sexual activity: Never  Lifestyle  . Physical activity:    Days per week: 0 days    Minutes per session: 0 min  . Stress: To some extent  Relationships  . Social connections:    Talks on phone: More than three times a week  Gets together: More than three times a week    Attends religious service: More than 4 times per year    Active member of club or organization: Yes    Attends meetings of clubs or organizations: More than 4 times per year    Relationship status: Widowed  Other Topics Concern  . Not on file  Social History Narrative  . Not on file    Outpatient Encounter Medications as of 02/28/2018  Medication Sig  . calcium carbonate (CALCIUM 500) 1250 MG tablet Take 2 tablets by mouth daily.    . Cholecalciferol (VITAMIN D-3) 1000 UNITS CAPS Take by mouth. Take one tablet twice daily  . diclofenac sodium (VOLTAREN) 1 % GEL Apply 2 g topically 4 (four) times daily.  . fish oil-omega-3 fatty acids 1000 MG capsule Take 2 g by mouth 2 (two) times daily.  Marland Kitchen. glucosamine-chondroitin 500-400 MG tablet Take 1 tablet by mouth 2 (two) times daily.  Marland Kitchen. levothyroxine  (SYNTHROID, LEVOTHROID) 25 MCG tablet TAKE 1 TABLET EVERY DAY  . Multiple Vitamin (MULTIVITAMIN) tablet Take 1 tablet by mouth daily.    . multivitamin-lutein (OCUVITE-LUTEIN) CAPS capsule Take 1 capsule by mouth daily.  Marland Kitchen. omeprazole (PRILOSEC) 40 MG capsule Take 1 capsule (40 mg total) by mouth daily.  Marland Kitchen. PARoxetine (PAXIL) 10 MG tablet Take 1 tablet (10 mg total) by mouth daily.  . psyllium (METAMUCIL) 58.6 % powder Take 1 packet by mouth 2 (two) times daily as needed.    . simvastatin (ZOCOR) 20 MG tablet Take 1 tablet (20 mg total) by mouth daily at 6 PM.  . [DISCONTINUED] azithromycin (ZITHROMAX) 250 MG tablet 2 tabs po qd x 1 day; 1 tablet per day x 4 days; (Patient not taking: Reported on 02/28/2018)  . [DISCONTINUED] promethazine-dextromethorphan (PROMETHAZINE-DM) 6.25-15 MG/5ML syrup Take 5 mLs by mouth 4 (four) times daily as needed for cough. (Patient not taking: Reported on 02/28/2018)   No facility-administered encounter medications on file as of 02/28/2018.     Activities of Daily Living In your present state of health, do you have any difficulty performing the following activities: 02/28/2018  Hearing? N  Vision? N  Difficulty concentrating or making decisions? N  Walking or climbing stairs? N  Dressing or bathing? N  Doing errands, shopping? N  Preparing Food and eating ? N  Using the Toilet? N  In the past six months, have you accidently leaked urine? N  Do you have problems with loss of bowel control? N  Managing your Medications? N  Managing your Finances? N  Housekeeping or managing your Housekeeping? N  Some recent data might be hidden    Patient Care Team: Myrlene Brokerrawford, Elizabeth A, MD as PCP - General (Internal Medicine) Valeria BatmanWhitfield, Peter W, MD as Consulting Physician (Orthopedic Surgery) Serena Colonelosen, Jefry, MD (Otolaryngology) Bjorn PippinWrenn, John, MD (Urology) Enid BaasFields, Karl, MD (Family Medicine) Santiago GladFreeman, Marshall, MD (Specialist) Antoine PrimasSmith, Zachary, MD (Inactive) as Resident      Assessment:   This is a routine wellness examination for Tiffany House. Physical assessment deferred to PCP.   Exercise Activities and Dietary recommendations Current Exercise Habits: The patient does not participate in regular exercise at present, Exercise limited by: orthopedic condition(s)  Diet (meal preparation, eat out, water intake, caffeinated beverages, dairy products, fruits and vegetables): in general, a "healthy" diet  , well balanced   Reviewed heart healthy diet, encouraged patient to increase daily water intake. Discussed weight loss strategies  Goals    . Patient Stated     Lose  weight by using portion control, monitor diet to include a lot of fruits and vegetables. Start to drink more water.       Fall Risk Fall Risk  02/28/2018 03/01/2017 11/25/2016 02/23/2016 01/02/2014  Falls in the past year? No Yes Yes No No  Number falls in past yr: - 1 1 - -  Injury with Fall? - Yes No - -  Follow up - - Falls prevention discussed - -    Depression Screen PHQ 2/9 Scores 02/28/2018 03/01/2017 11/25/2016 02/23/2016  PHQ - 2 Score 2 0 0 0  PHQ- 9 Score 2 - - -     Cognitive Function       Ad8 score reviewed for issues:  Issues making decisions: no  Less interest in hobbies / activities: no  Repeats questions, stories (family complaining): no  Trouble using ordinary gadgets (microwave, computer, phone):no  Forgets the month or year: no  Mismanaging finances: no  Remembering appts: no  Daily problems with thinking and/or memory: no Ad8 score is= 0  Immunization History  Administered Date(s) Administered  . Influenza Split 10/06/2011  . Influenza Whole 08/19/2008, 08/28/2009, 08/31/2010  . Influenza, High Dose Seasonal PF 08/29/2013  . Influenza-Unspecified 09/13/2012, 08/22/2014, 09/26/2015, 08/29/2016, 08/26/2017  . Pneumococcal Conjugate-13 06/05/2015  . Pneumococcal Polysaccharide-23 01/02/2014  . Td 10/19/2000, 11/02/2010  . Zoster 05/17/2015    Screening  Tests Health Maintenance  Topic Date Due  . Hepatitis C Screening  1945-12-08  . MAMMOGRAM  03/20/2017  . INFLUENZA VACCINE  06/29/2018  . COLONOSCOPY  02/01/2019  . TETANUS/TDAP  11/02/2020  . DEXA SCAN  Completed  . PNA vac Low Risk Adult  Completed      Plan:    Continue doing brain stimulating activities (puzzles, reading, adult coloring books, staying active) to keep memory sharp.   Continue to eat heart healthy diet (full of fruits, vegetables, whole grains, lean protein, water--limit salt, fat, and sugar intake) and increase physical activity as tolerated.  I have personally reviewed and noted the following in the patient's chart:   . Medical and social history . Use of alcohol, tobacco or illicit drugs  . Current medications and supplements . Functional ability and status . Nutritional status . Physical activity . Advanced directives . List of other physicians . Vitals . Screenings to include cognitive, depression, and falls . Referrals and appointments  In addition, I have reviewed and discussed with patient certain preventive protocols, quality metrics, and best practice recommendations. A written personalized care plan for preventive services as well as general preventive health recommendations were provided to patient.     Wanda Plump, RN  02/28/2018

## 2018-02-28 NOTE — Patient Instructions (Addendum)
Continue doing brain stimulating activities (puzzles, reading, adult coloring books, staying active) to keep memory sharp.   Continue to eat heart healthy diet (full of fruits, vegetables, whole grains, lean protein, water--limit salt, fat, and sugar intake) and increase physical activity as tolerated.   [name] , Thank you for taking time to come for your Medicare Wellness Visit. I appreciate your ongoing commitment to your health goals. Please review the following plan we discussed and let me know if I can assist you in the future.   These are the goals we discussed: Goals    . Patient Stated     Lose weight by using portion control, monitor diet to include a lot of fruits and vegetables. Start to drink more water.       This is a list of the screening recommended for you and due dates:  Health Maintenance  Topic Date Due  .  Hepatitis C: One time screening is recommended by Center for Disease Control  (CDC) for  adults born from [birth year] through 1966.   December 21, 1945  . Mammogram  03/20/2017  . Flu Shot  06/29/2018  . Colon Cancer Screening  02/01/2019  . Tetanus Vaccine  11/02/2020  . DEXA scan (bone density measurement)  Completed  . Pneumonia vaccines  Completed    Knee Exercises Ask your health care provider which exercises are safe for you. Do exercises exactly as told by your health care provider and adjust them as directed. It is normal to feel mild stretching, pulling, tightness, or discomfort as you do these exercises, but you should stop right away if you feel sudden pain or your pain gets worse.Do not begin these exercises until told by your health care provider. STRETCHING AND RANGE OF MOTION EXERCISES These exercises warm up your muscles and joints and improve the movement and flexibility of your knee. These exercises also help to relieve pain, numbness, and tingling. Exercise A: Knee Extension, Prone 1. Lie on your abdomen on a bed. 2. Place your left / right knee just  beyond the edge of the surface so your knee is not on the bed. You can put a towel under your left / right thigh just above your knee for comfort. 3. Relax your leg muscles and allow gravity to straighten your knee. You should feel a stretch behind your left / right knee. 4. Hold this position for __________ seconds. 5. Scoot up so your knee is supported between repetitions. Repeat __________ times. Complete this stretch __________ times a day. Exercise B: Knee Flexion, Active  1. Lie on your back with both knees straight. If this causes back discomfort, bend your left / right knee so your foot is flat on the floor. 2. Slowly slide your left / right heel back toward your buttocks until you feel a gentle stretch in the front of your knee or thigh. 3. Hold this position for __________ seconds. 4. Slowly slide your left / right heel back to the starting position. Repeat __________ times. Complete this exercise __________ times a day. Exercise C: Quadriceps, Prone  1. Lie on your abdomen on a firm surface, such as a bed or padded floor. 2. Bend your left / right knee and hold your ankle. If you cannot reach your ankle or pant leg, loop a belt around your foot and grab the belt instead. 3. Gently pull your heel toward your buttocks. Your knee should not slide out to the side. You should feel a stretch in the front of  your thigh and knee. 4. Hold this position for __________ seconds. Repeat __________ times. Complete this stretch __________ times a day. Exercise D: Hamstring, Supine 1. Lie on your back. 2. Loop a belt or towel over the ball of your left / right foot. The ball of your foot is on the walking surface, right under your toes. 3. Straighten your left / right knee and slowly pull on the belt to raise your leg until you feel a gentle stretch behind your knee. ? Do not let your left / right knee bend while you do this. ? Keep your other leg flat on the floor. 4. Hold this position for  __________ seconds. Repeat __________ times. Complete this stretch __________ times a day. STRENGTHENING EXERCISES These exercises build strength and endurance in your knee. Endurance is the ability to use your muscles for a long time, even after they get tired. Exercise E: Quadriceps, Isometric  1. Lie on your back with your left / right leg extended and your other knee bent. Put a rolled towel or small pillow under your knee if told by your health care provider. 2. Slowly tense the muscles in the front of your left / right thigh. You should see your kneecap slide up toward your hip or see increased dimpling just above the knee. This motion will push the back of the knee toward the floor. 3. For __________ seconds, keep the muscle as tight as you can without increasing your pain. 4. Relax the muscles slowly and completely. Repeat __________ times. Complete this exercise __________ times a day. Exercise F: Straight Leg Raises - Quadriceps 1. Lie on your back with your left / right leg extended and your other knee bent. 2. Tense the muscles in the front of your left / right thigh. You should see your kneecap slide up or see increased dimpling just above the knee. Your thigh may even shake a bit. 3. Keep these muscles tight as you raise your leg 4-6 inches (10-15 cm) off the floor. Do not let your knee bend. 4. Hold this position for __________ seconds. 5. Keep these muscles tense as you lower your leg. 6. Relax your muscles slowly and completely after each repetition. Repeat __________ times. Complete this exercise __________ times a day. Exercise G: Hamstring, Isometric 1. Lie on your back on a firm surface. 2. Bend your left / right knee approximately __________ degrees. 3. Dig your left / right heel into the surface as if you are trying to pull it toward your buttocks. Tighten the muscles in the back of your thighs to dig as hard as you can without increasing any pain. 4. Hold this position  for __________ seconds. 5. Release the tension gradually and allow your muscles to relax completely for __________ seconds after each repetition. Repeat __________ times. Complete this exercise __________ times a day. Exercise H: Hamstring Curls  If told by your health care provider, do this exercise while wearing ankle weights. Begin with __________ weights. Then increase the weight by 1 lb (0.5 kg) increments. Do not wear ankle weights that are more than __________. 1. Lie on your abdomen with your legs straight. 2. Bend your left / right knee as far as you can without feeling pain. Keep your hips flat against the floor. 3. Hold this position for __________ seconds. 4. Slowly lower your leg to the starting position.  Repeat __________ times. Complete this exercise __________ times a day. Exercise I: Squats (Quadriceps) 1. Stand in front of a  table, with your feet and knees pointing straight ahead. You may rest your hands on the table for balance but not for support. 2. Slowly bend your knees and lower your hips like you are going to sit in a chair. ? Keep your weight over your heels, not over your toes. ? Keep your lower legs upright so they are parallel with the table legs. ? Do not let your hips go lower than your knees. ? Do not bend lower than told by your health care provider. ? If your knee pain increases, do not bend as low. 3. Hold the squat position for __________ seconds. 4. Slowly push with your legs to return to standing. Do not use your hands to pull yourself to standing. Repeat __________ times. Complete this exercise __________ times a day. Exercise J: Wall Slides (Quadriceps)  1. Lean your back against a smooth wall or door while you walk your feet out 18-24 inches (46-61 cm) from it. 2. Place your feet hip-width apart. 3. Slowly slide down the wall or door until your knees bend __________ degrees. Keep your knees over your heels, not over your toes. Keep your knees in  line with your hips. 4. Hold for __________ seconds. Repeat __________ times. Complete this exercise __________ times a day. Exercise K: Straight Leg Raises - Hip Abductors 1. Lie on your side with your left / right leg in the top position. Lie so your head, shoulder, knee, and hip line up. You may bend your bottom knee to help you keep your balance. 2. Roll your hips slightly forward so your hips are stacked directly over each other and your left / right knee is facing forward. 3. Leading with your heel, lift your top leg 4-6 inches (10-15 cm). You should feel the muscles in your outer hip lifting. ? Do not let your foot drift forward. ? Do not let your knee roll toward the ceiling. 4. Hold this position for __________ seconds. 5. Slowly return your leg to the starting position. 6. Let your muscles relax completely after each repetition. Repeat __________ times. Complete this exercise __________ times a day. Exercise L: Straight Leg Raises - Hip Extensors 1. Lie on your abdomen on a firm surface. You can put a pillow under your hips if that is more comfortable. 2. Tense the muscles in your buttocks and lift your left / right leg about 4-6 inches (10-15 cm). Keep your knee straight as you lift your leg. 3. Hold this position for __________ seconds. 4. Slowly lower your leg to the starting position. 5. Let your leg relax completely after each repetition. Repeat __________ times. Complete this exercise __________ times a day. This information is not intended to replace advice given to you by your health care provider. Make sure you discuss any questions you have with your health care provider. Document Released: 09/29/2005 Document Revised: 08/09/2016 Document Reviewed: 09/21/2015 Elsevier Interactive Patient Education  2018 [employer].

## 2018-03-01 ENCOUNTER — Other Ambulatory Visit: Payer: Self-pay | Admitting: Internal Medicine

## 2018-03-01 DIAGNOSIS — Z1231 Encounter for screening mammogram for malignant neoplasm of breast: Secondary | ICD-10-CM

## 2018-03-22 ENCOUNTER — Ambulatory Visit
Admission: RE | Admit: 2018-03-22 | Discharge: 2018-03-22 | Disposition: A | Discharge status: Home / Self Care | Payer: Medicare HMO | Source: Ambulatory Visit | Attending: Internal Medicine | Admitting: Internal Medicine

## 2018-03-22 DIAGNOSIS — Z1231 Encounter for screening mammogram for malignant neoplasm of breast: Secondary | ICD-10-CM | POA: Diagnosis not present

## 2018-05-03 DIAGNOSIS — H6123 Impacted cerumen, bilateral: Secondary | ICD-10-CM | POA: Diagnosis not present

## 2018-05-03 DIAGNOSIS — H9113 Presbycusis, bilateral: Secondary | ICD-10-CM | POA: Insufficient documentation

## 2018-07-29 DIAGNOSIS — B9789 Other viral agents as the cause of diseases classified elsewhere: Secondary | ICD-10-CM | POA: Diagnosis not present

## 2018-07-29 DIAGNOSIS — J028 Acute pharyngitis due to other specified organisms: Secondary | ICD-10-CM | POA: Diagnosis not present

## 2018-09-25 DIAGNOSIS — R69 Illness, unspecified: Secondary | ICD-10-CM | POA: Diagnosis not present

## 2018-11-20 ENCOUNTER — Other Ambulatory Visit: Payer: Self-pay | Admitting: Internal Medicine

## 2018-11-24 ENCOUNTER — Other Ambulatory Visit: Payer: Self-pay | Admitting: Internal Medicine

## 2018-12-07 ENCOUNTER — Ambulatory Visit (INDEPENDENT_AMBULATORY_CARE_PROVIDER_SITE_OTHER): Payer: Medicare HMO | Admitting: Internal Medicine

## 2018-12-07 ENCOUNTER — Ambulatory Visit (INDEPENDENT_AMBULATORY_CARE_PROVIDER_SITE_OTHER)
Admission: RE | Admit: 2018-12-07 | Discharge: 2018-12-07 | Disposition: A | Discharge status: Home / Self Care | Payer: Medicare HMO | Source: Ambulatory Visit | Attending: Internal Medicine | Admitting: Internal Medicine

## 2018-12-07 ENCOUNTER — Encounter: Payer: Self-pay | Admitting: Internal Medicine

## 2018-12-07 ENCOUNTER — Other Ambulatory Visit (INDEPENDENT_AMBULATORY_CARE_PROVIDER_SITE_OTHER): Payer: Medicare HMO

## 2018-12-07 VITALS — BP 130/86 | HR 87 | Temp 98.0°F | Ht 60.0 in | Wt 153.0 lb

## 2018-12-07 DIAGNOSIS — E039 Hypothyroidism, unspecified: Secondary | ICD-10-CM

## 2018-12-07 DIAGNOSIS — Z0000 Encounter for general adult medical examination without abnormal findings: Secondary | ICD-10-CM

## 2018-12-07 DIAGNOSIS — M1711 Unilateral primary osteoarthritis, right knee: Secondary | ICD-10-CM | POA: Diagnosis not present

## 2018-12-07 DIAGNOSIS — E785 Hyperlipidemia, unspecified: Secondary | ICD-10-CM

## 2018-12-07 DIAGNOSIS — Z1211 Encounter for screening for malignant neoplasm of colon: Secondary | ICD-10-CM | POA: Diagnosis not present

## 2018-12-07 DIAGNOSIS — K219 Gastro-esophageal reflux disease without esophagitis: Secondary | ICD-10-CM | POA: Diagnosis not present

## 2018-12-07 DIAGNOSIS — E2839 Other primary ovarian failure: Secondary | ICD-10-CM

## 2018-12-07 DIAGNOSIS — R69 Illness, unspecified: Secondary | ICD-10-CM | POA: Diagnosis not present

## 2018-12-07 DIAGNOSIS — F4321 Adjustment disorder with depressed mood: Secondary | ICD-10-CM

## 2018-12-07 LAB — COMPREHENSIVE METABOLIC PANEL
ALT: 28 U/L (ref 0–35)
AST: 25 U/L (ref 0–37)
Albumin: 4.4 g/dL (ref 3.5–5.2)
Alkaline Phosphatase: 70 U/L (ref 39–117)
BUN: 20 mg/dL (ref 6–23)
CALCIUM: 10.1 mg/dL (ref 8.4–10.5)
CO2: 25 mEq/L (ref 19–32)
Chloride: 106 mEq/L (ref 96–112)
Creatinine, Ser: 1.09 mg/dL (ref 0.40–1.20)
GFR: 52.18 mL/min — ABNORMAL LOW (ref >60.00)
Glucose, Bld: 98 mg/dL (ref 70–99)
Potassium: 4.1 mEq/L (ref 3.5–5.1)
Sodium: 141 mEq/L (ref 135–145)
Total Bilirubin: 0.5 mg/dL (ref 0.2–1.2)
Total Protein: 7.1 g/dL (ref 6.0–8.3)

## 2018-12-07 LAB — CBC
HCT: 40.3 % (ref 36.0–46.0)
Hemoglobin: 13.4 g/dL (ref 12.0–15.0)
MCHC: 33.2 g/dL (ref 30.0–36.0)
MCV: 89.2 fl (ref 78.0–100.0)
PLATELETS: 319 10*3/uL (ref 150.0–400.0)
RBC: 4.52 Mil/uL (ref 3.87–5.11)
RDW: 14.3 % (ref 11.5–15.5)
WBC: 9.9 10*3/uL (ref 4.0–10.5)

## 2018-12-07 LAB — T4, FREE: Free T4: 0.89 ng/dL (ref 0.60–1.60)

## 2018-12-07 LAB — LIPID PANEL
Cholesterol: 205 mg/dL — ABNORMAL HIGH (ref 0–200)
HDL: 42.7 mg/dL (ref >39.00)
NonHDL: 162.61
TRIGLYCERIDES: 231 mg/dL — AB (ref 0.0–149.0)
Total CHOL/HDL Ratio: 5
VLDL: 46.2 mg/dL — ABNORMAL HIGH (ref 0.0–40.0)

## 2018-12-07 LAB — TSH: TSH: 4.59 u[IU]/mL — ABNORMAL HIGH (ref 0.35–4.50)

## 2018-12-07 LAB — LDL CHOLESTEROL, DIRECT: Direct LDL: 124 mg/dL

## 2018-12-07 MED ORDER — SIMVASTATIN 20 MG PO TABS: 20.0000 mg | ORAL_TABLET | Freq: Every day | ORAL | 3 refills | Status: DC

## 2018-12-07 MED ORDER — LEVOTHYROXINE SODIUM 25 MCG PO TABS: ORAL_TABLET | ORAL | 3 refills | Status: DC

## 2018-12-07 MED ORDER — PAROXETINE HCL 10 MG PO TABS: 10.0000 mg | ORAL_TABLET | Freq: Every day | ORAL | 3 refills | Status: DC

## 2018-12-07 MED ORDER — DICLOFENAC SODIUM 1 % TD GEL: 2.0000 g | Freq: Four times a day (QID) | TRANSDERMAL | 3 refills | Status: AC

## 2018-12-07 MED ORDER — OMEPRAZOLE 40 MG PO CPDR: 40.0000 mg | DELAYED_RELEASE_CAPSULE | Freq: Every day | ORAL | 3 refills | Status: DC

## 2018-12-07 NOTE — Patient Instructions (Addendum)
We will get the bone density test done and have the colon test sent to your house to do.    Health Maintenance, Female Adopting a healthy lifestyle and getting preventive care can go a long way to promote health and wellness. Talk with your health care provider about what schedule of regular examinations is right for you. This is a good chance for you to check in with your provider about disease prevention and staying healthy. In between checkups, there are plenty of things you can do on your own. Experts have done a lot of research about which lifestyle changes and preventive measures are most likely to keep you healthy. Ask your health care provider for more information. Weight and diet Eat a healthy diet  Be sure to include plenty of vegetables, fruits, low-fat dairy products, and lean protein.  Do not eat a lot of foods high in solid fats, added sugars, or salt.  Get regular exercise. This is one of the most important things you can do for your health. ? Most adults should exercise for at least 150 minutes each week. The exercise should increase your heart rate and make you sweat (moderate-intensity exercise). ? Most adults should also do strengthening exercises at least twice a week. This is in addition to the moderate-intensity exercise. Maintain a healthy weight  Body mass index (BMI) is a measurement that can be used to identify possible weight problems. It estimates body fat based on height and weight. Your health care provider can help determine your BMI and help you achieve or maintain a healthy weight.  For females 71 years of age and older: ? A BMI below 18.5 is considered underweight. ? A BMI of 18.5 to 24.9 is normal. ? A BMI of 25 to 29.9 is considered overweight. ? A BMI of 30 and above is considered obese. Watch levels of cholesterol and blood lipids  You should start having your blood tested for lipids and cholesterol at 74 years of age, then have this test every 5  years.  You may need to have your cholesterol levels checked more often if: ? Your lipid or cholesterol levels are high. ? You are older than 74 years of age. ? You are at high risk for heart disease. Cancer screening Lung Cancer  Lung cancer screening is recommended for adults 21-39 years old who are at high risk for lung cancer because of a history of smoking.  A yearly low-dose CT scan of the lungs is recommended for people who: ? Currently smoke. ? Have quit within the past 15 years. ? Have at least a 30-pack-year history of smoking. A pack year is smoking an average of one pack of cigarettes a day for 1 year.  Yearly screening should continue until it has been 15 years since you quit.  Yearly screening should stop if you develop a health problem that would prevent you from having lung cancer treatment. Breast Cancer  Practice breast self-awareness. This means understanding how your breasts normally appear and feel.  It also means doing regular breast self-exams. Let your health care provider know about any changes, no matter how small.  If you are in your 20s or 30s, you should have a clinical breast exam (CBE) by a health care provider every 1-3 years as part of a regular health exam.  If you are 39 or older, have a CBE every year. Also consider having a breast X-ray (mammogram) every year.  If you have a family history of  breast cancer, talk to your health care provider about genetic screening.  If you are at high risk for breast cancer, talk to your health care provider about having an MRI and a mammogram every year.  Breast cancer gene (BRCA) assessment is recommended for women who have family members with BRCA-related cancers. BRCA-related cancers include: ? Breast. ? Ovarian. ? Tubal. ? Peritoneal cancers.  Results of the assessment will determine the need for genetic counseling and BRCA1 and BRCA2 testing. Cervical Cancer Your health care provider may recommend  that you be screened regularly for cancer of the pelvic organs (ovaries, uterus, and vagina). This screening involves a pelvic examination, including checking for microscopic changes to the surface of your cervix (Pap test). You may be encouraged to have this screening done every 3 years, beginning at age 32.  For women ages 86-65, health care providers may recommend pelvic exams and Pap testing every 3 years, or they may recommend the Pap and pelvic exam, combined with testing for human papilloma virus (HPV), every 5 years. Some types of HPV increase your risk of cervical cancer. Testing for HPV may also be done on women of any age with unclear Pap test results.  Other health care providers may not recommend any screening for nonpregnant women who are considered low risk for pelvic cancer and who do not have symptoms. Ask your health care provider if a screening pelvic exam is right for you.  If you have had past treatment for cervical cancer or a condition that could lead to cancer, you need Pap tests and screening for cancer for at least 20 years after your treatment. If Pap tests have been discontinued, your risk factors (such as having a new sexual partner) need to be reassessed to determine if screening should resume. Some women have medical problems that increase the chance of getting cervical cancer. In these cases, your health care provider may recommend more frequent screening and Pap tests. Colorectal Cancer  This type of cancer can be detected and often prevented.  Routine colorectal cancer screening usually begins at 74 years of age and continues through 74 years of age.  Your health care provider may recommend screening at an earlier age if you have risk factors for colon cancer.  Your health care provider may also recommend using home test kits to check for hidden blood in the stool.  A small camera at the end of a tube can be used to examine your colon directly (sigmoidoscopy or  colonoscopy). This is done to check for the earliest forms of colorectal cancer.  Routine screening usually begins at age 73.  Direct examination of the colon should be repeated every 5-10 years through 74 years of age. However, you may need to be screened more often if early forms of precancerous polyps or small growths are found. Skin Cancer  Check your skin from head to toe regularly.  Tell your health care provider about any new moles or changes in moles, especially if there is a change in a mole's shape or color.  Also tell your health care provider if you have a mole that is larger than the size of a pencil eraser.  Always use sunscreen. Apply sunscreen liberally and repeatedly throughout the day.  Protect yourself by wearing long sleeves, pants, a wide-brimmed hat, and sunglasses whenever you are outside. Heart disease, diabetes, and high blood pressure  High blood pressure causes heart disease and increases the risk of stroke. High blood pressure is more likely  to develop in: ? People who have blood pressure in the high end of the normal range (130-139/85-89 mm Hg). ? People who are overweight or obese. ? People who are African American.  If you are 73-52 years of age, have your blood pressure checked every 3-5 years. If you are 64 years of age or older, have your blood pressure checked every year. You should have your blood pressure measured twice-once when you are at a hospital or clinic, and once when you are not at a hospital or clinic. Record the average of the two measurements. To check your blood pressure when you are not at a hospital or clinic, you can use: ? An automated blood pressure machine at a pharmacy. ? A home blood pressure monitor.  If you are between 24 years and 61 years old, ask your health care provider if you should take aspirin to prevent strokes.  Have regular diabetes screenings. This involves taking a blood sample to check your fasting blood sugar  level. ? If you are at a normal weight and have a low risk for diabetes, have this test once every three years after 74 years of age. ? If you are overweight and have a high risk for diabetes, consider being tested at a younger age or more often. Preventing infection Hepatitis B  If you have a higher risk for hepatitis B, you should be screened for this virus. You are considered at high risk for hepatitis B if: ? You were born in a country where hepatitis B is common. Ask your health care provider which countries are considered high risk. ? Your parents were born in a high-risk country, and you have not been immunized against hepatitis B (hepatitis B vaccine). ? You have HIV or AIDS. ? You use needles to inject street drugs. ? You live with someone who has hepatitis B. ? You have had sex with someone who has hepatitis B. ? You get hemodialysis treatment. ? You take certain medicines for conditions, including cancer, organ transplantation, and autoimmune conditions. Hepatitis C  Blood testing is recommended for: ? Everyone born from [birth year] through 1965. ? Anyone with known risk factors for hepatitis C. Sexually transmitted infections (STIs)  You should be screened for sexually transmitted infections (STIs) including gonorrhea and chlamydia if: ? You are sexually active and are younger than 74 years of age. ? You are older than 74 years of age and your health care provider tells you that you are at risk for this type of infection. ? Your sexual activity has changed since you were last screened and you are at an increased risk for chlamydia or gonorrhea. Ask your health care provider if you are at risk.  If you do not have HIV, but are at risk, it may be recommended that you take a prescription medicine daily to prevent HIV infection. This is called pre-exposure prophylaxis (PrEP). You are considered at risk if: ? You are sexually active and do not regularly use condoms or know the HIV status  of your partner(s). ? You take drugs by injection. ? You are sexually active with a partner who has HIV. Talk with your health care provider about whether you are at high risk of being infected with HIV. If you choose to begin PrEP, you should first be tested for HIV. You should then be tested every 3 months for as long as you are taking PrEP. Pregnancy  If you are premenopausal and you may become pregnant, ask your  health care provider about preconception counseling.  If you may become pregnant, take 400 to 800 micrograms (mcg) of folic acid every day.  If you want to prevent pregnancy, talk to your health care provider about birth control (contraception). Osteoporosis and menopause  Osteoporosis is a disease in which the bones lose minerals and strength with aging. This can result in serious bone fractures. Your risk for osteoporosis can be identified using a bone density scan.  If you are 44 years of age or older, or if you are at risk for osteoporosis and fractures, ask your health care provider if you should be screened.  Ask your health care provider whether you should take a calcium or vitamin D supplement to lower your risk for osteoporosis.  Menopause may have certain physical symptoms and risks.  Hormone replacement therapy may reduce some of these symptoms and risks. Talk to your health care provider about whether hormone replacement therapy is right for you. Follow these instructions at home:  Schedule regular health, dental, and eye exams.  Stay current with your immunizations.  Do not use any tobacco products including cigarettes, chewing tobacco, or electronic cigarettes.  If you are pregnant, do not drink alcohol.  If you are breastfeeding, limit how much and how often you drink alcohol.  Limit alcohol intake to no more than 1 drink per day for nonpregnant women. One drink equals 12 ounces of beer, 5 ounces of wine, or 1 ounces of hard liquor.  Do not use street  drugs.  Do not share needles.  Ask your health care provider for help if you need support or information about quitting drugs.  Tell your health care provider if you often feel depressed.  Tell your health care provider if you have ever been abused or do not feel safe at home. This information is not intended to replace advice given to you by your health care provider. Make sure you discuss any questions you have with your health care provider. Document Released: 05/31/2011 Document Revised: 04/22/2016 Document Reviewed: 08/19/2015 Elsevier Interactive Patient Education  2019 [employer].

## 2018-12-07 NOTE — Progress Notes (Signed)
   Subjective:   Patient ID: Tiffany House, female    DOB: [birth year], 73 y.o.   MRN: [id number]  HPI The patient is a 73 YO female coming in for physical.   PMH, [facility], social history reviewed and updated.   Review of Systems  Constitutional: Negative.   HENT: Negative.   Eyes: Negative.   Respiratory: Negative for cough, chest tightness and shortness of breath.   Cardiovascular: Negative for chest pain, palpitations and leg swelling.  Gastrointestinal: Negative for abdominal distention, abdominal pain, constipation, diarrhea, nausea and vomiting.  Musculoskeletal: Positive for arthralgias.  Skin: Negative.   Neurological: Negative.   Psychiatric/Behavioral: Negative.     Objective:  Physical Exam Constitutional:      Appearance: She is well-developed.  HENT:     Head: Normocephalic and atraumatic.  Neck:     Musculoskeletal: Normal range of motion.  Cardiovascular:     Rate and Rhythm: Normal rate and regular rhythm.  Pulmonary:     Effort: Pulmonary effort is normal. No respiratory distress.     Breath sounds: Normal breath sounds. No wheezing or rales.  Abdominal:     General: Bowel sounds are normal. There is no distension.     Palpations: Abdomen is soft.     Tenderness: There is no abdominal tenderness. There is no rebound.  Skin:    General: Skin is warm and dry.  Neurological:     Mental Status: She is alert and oriented to person, place, and time.     Coordination: Coordination normal.     Vitals:   12/07/18 0926  BP: 130/86  Pulse: 87  Temp: 98 F (36.7 C)  TempSrc: Oral  SpO2: 98%  Weight: 153 lb (69.4 kg)  Height: 5' (1.524 m)    Assessment & Plan:

## 2018-12-08 DIAGNOSIS — Z0000 Encounter for general adult medical examination without abnormal findings: Secondary | ICD-10-CM | POA: Insufficient documentation

## 2018-12-08 NOTE — Assessment & Plan Note (Signed)
Controlled on prilosec daily. Continue.

## 2018-12-08 NOTE — Assessment & Plan Note (Signed)
Referral to orthopedic for possible knee replacement.

## 2018-12-08 NOTE — Assessment & Plan Note (Signed)
Doing well on paxil. No adjustment needed today.

## 2018-12-08 NOTE — Assessment & Plan Note (Signed)
Checking lipid panel and adjust simvastatin 20 mg daily as needed.  

## 2018-12-08 NOTE — Assessment & Plan Note (Signed)
Checking TSH and adjust synthroid 25 mcg daily as needed.  ?

## 2018-12-08 NOTE — Assessment & Plan Note (Addendum)
Flu shot up to date. Pneumonia complete. Shingrix counseled. Tetanus up to date. Cologuard ordered. Mammogram up to date, pap smear aged out and dexa ordered. Counseled about sun safety and mole surveillance. Counseled about the dangers of distracted driving. Given 10 year screening recommendations.

## 2018-12-14 ENCOUNTER — Telehealth: Payer: Self-pay

## 2018-12-14 NOTE — Telephone Encounter (Signed)
PA started on CoverMyMeds KEY: [id number]  Approved 11/27/2018-11/29/2019

## 2018-12-20 DIAGNOSIS — Z1211 Encounter for screening for malignant neoplasm of colon: Secondary | ICD-10-CM | POA: Diagnosis not present

## 2018-12-20 DIAGNOSIS — Z1212 Encounter for screening for malignant neoplasm of rectum: Secondary | ICD-10-CM | POA: Diagnosis not present

## 2018-12-21 LAB — COLOGUARD: Cologuard: NEGATIVE

## 2018-12-24 LAB — COLOGUARD

## 2018-12-28 ENCOUNTER — Encounter: Payer: Self-pay | Admitting: Internal Medicine

## 2018-12-28 NOTE — Progress Notes (Signed)
Abstracted and sent to scan  

## 2019-01-16 DIAGNOSIS — M25561 Pain in right knee: Secondary | ICD-10-CM | POA: Diagnosis not present

## 2019-01-16 DIAGNOSIS — M1711 Unilateral primary osteoarthritis, right knee: Secondary | ICD-10-CM | POA: Diagnosis not present

## 2019-01-22 DIAGNOSIS — M25561 Pain in right knee: Secondary | ICD-10-CM | POA: Diagnosis not present

## 2019-02-01 DIAGNOSIS — M1711 Unilateral primary osteoarthritis, right knee: Secondary | ICD-10-CM | POA: Diagnosis not present

## 2019-02-15 ENCOUNTER — Encounter: Payer: Self-pay | Admitting: Gastroenterology

## 2019-02-22 ENCOUNTER — Telehealth: Payer: Self-pay | Admitting: *Deleted

## 2019-02-22 NOTE — Telephone Encounter (Signed)
Called patient to inform them the nurse needs to either convert their upcoming AWV to a virtual visit or reschedule the visit out into the future due to covid-19 safety measures. Patient's VMB is not set up, nurse unable to LVM. Nurse will try to call-back at a later date.

## 2019-02-23 ENCOUNTER — Telehealth: Payer: Self-pay | Admitting: *Deleted

## 2019-02-23 NOTE — Telephone Encounter (Signed)
Pt called back and left a vm in general box, she was returning Jills call   [phone]

## 2019-02-23 NOTE — Telephone Encounter (Signed)
Called patient in response to message nurse received through [facility] to call patient. Patient's AWV had already been rescheduled for 05/16/19.

## 2019-02-28 ENCOUNTER — Other Ambulatory Visit: Payer: Self-pay | Admitting: Internal Medicine

## 2019-03-05 ENCOUNTER — Ambulatory Visit: Payer: Medicare HMO

## 2019-03-22 DIAGNOSIS — M1711 Unilateral primary osteoarthritis, right knee: Secondary | ICD-10-CM | POA: Diagnosis not present

## 2019-05-16 ENCOUNTER — Ambulatory Visit (INDEPENDENT_AMBULATORY_CARE_PROVIDER_SITE_OTHER): Payer: Medicare HMO | Admitting: *Deleted

## 2019-05-16 DIAGNOSIS — Z0000 Encounter for general adult medical examination without abnormal findings: Secondary | ICD-10-CM

## 2019-05-16 NOTE — Progress Notes (Addendum)
Subjective:   Tiffany House is a 73 y.o. female who presents for Medicare Annual (Subsequent) preventive examination.  I connected with patient 05/16/19 at  1:00 PM EDT by a video/audio enabled telemedicine application and verified that I am speaking with the correct person using two identifiers. Patient stated full name and DOB. Patient gave permission to continue with virtual visit. Patient's location was at home and Nurse's location was at [facility] office.  Review of Systems:   Cardiac Risk Factors include: dyslipidemia;advanced age (>10555men, 50>65 women) Sleep patterns: feels rested on waking, gets up 0-1 times nightly to void and sleeps 6-8 hours nightly.    Home Safety/Smoke Alarms: Feels safe in home. Smoke alarms in place.  Living environment; residence and Firearm Safety: 1-story house/ trailer. Lives alone, no needs for DME, good support system Seat Belt Safety/Bike Helmet: Wears seat belt.      Objective:     Vitals: There were no vitals taken for this visit.  There is no height or weight on file to calculate BMI.  Advanced Directives 05/16/2019 02/28/2018 11/25/2016  Does Patient Have a Medical Advance Directive? Yes Yes Yes  Type of Estate agentAdvance Directive Healthcare Power of [place];Living will Healthcare Power of [place]orney;Living will Healthcare Power of [place];Living will  Copy of Healthcare Power of Attorney in Chart? No - copy requested No - copy requested No - copy requested    Tobacco Social History   Tobacco Use  Smoking Status Never Smoker  Smokeless Tobacco Never Used     Counseling given: Not Answered  Past Medical History:  Diagnosis Date  . Allergic rhinitis, cause unspecified   . Arthritis   . Benign positional vertigo   . GERD   . HYPERLIPIDEMIA   . Hypertension   . HYPOTHYROIDISM dx 10/2010  . Low back pain    MRI 02/2011  . Migraines   . OSTEOPENIA   . Osteoporosis    Qualifier: Diagnosis of  By: Nena JordanYoo DO, D. Robert   . RENAL CALCULUS, RIGHT    L  01/2012   Past Surgical History:  Procedure Laterality Date  . HAND SURGERY  2001   Dr Teressa SenterSypher-- surgery of right hand to rebuild joint  . HERNIA REPAIR  03/2016  . Kidney stone removed  01/12/10  . TUBAL LIGATION  1979   Family History  Problem Relation Age of Onset  . Hyperlipidemia Mother   . Hypertension Mother   . Cancer Father        prostate. Tiffany House  . Coronary artery disease Father   . Hypertension Father    Social History   Socioeconomic History  . Marital status: Widowed    Spouse name: Not on file  . Number of children: 0  . Years of education: Not on file  . Highest education level: Not on file  Occupational History  . Occupation: Copywriter, advertisingDesigner    Employer: SELF EMPLOYED  Social Needs  . Financial resource strain: Not hard at all  . Food insecurity    Worry: Never true    Inability: Never true  . Transportation needs    Medical: No    Non-medical: No  Tobacco Use  . Smoking status: Never Smoker  . Smokeless tobacco: Never Used  Substance and Sexual Activity  . Alcohol use: Yes    Comment: occasional  . Drug use: No  . Sexual activity: Never  Lifestyle  . Physical activity    Days per week: 0 days    Minutes per  session: 0 min  . Stress: Only a little  Relationships  . Social connections    Talks on phone: More than three times a week    Gets together: More than three times a week    Attends religious service: More than 4 times per year    Active member of club or organization: Yes    Attends meetings of clubs or organizations: More than 4 times per year    Relationship status: Widowed  Other Topics Concern  . Not on file  Social History Narrative  . Not on file    Outpatient Encounter Medications as of 05/16/2019  Medication Sig  . acetaminophen (TYLENOL) 325 MG tablet Take 650 mg by mouth every 6 (six) hours as needed.  . calcium carbonate (CALCIUM 500) 1250 MG tablet Take 2 tablets by mouth daily.    . Cholecalciferol (VITAMIN D-3) 1000  UNITS CAPS Take by mouth. Take one tablet twice daily  . diclofenac sodium (VOLTAREN) 1 % GEL Apply 2 g topically 4 (four) times daily.  . fish oil-omega-3 fatty acids 1000 MG capsule Take 2 g by mouth 2 (two) times daily.  . Glucosamine-Chondroit-Calcium (TRIPLE FLEX BONE & JOINT PO) Take 40 mg by mouth.  . levothyroxine (SYNTHROID, LEVOTHROID) 25 MCG tablet TAKE ONE TABLET BY MOUTH DAILY.  . meloxicam (MOBIC) 7.5 MG tablet Take 7.5 mg by mouth as needed for pain.  . Multiple Vitamin (MULTIVITAMIN) tablet Take 1 tablet by mouth daily.    . multivitamin-lutein (OCUVITE-LUTEIN) CAPS capsule Take 1 capsule by mouth daily.  Marland Kitchen. omeprazole (PRILOSEC) 40 MG capsule Take 1 capsule (40 mg total) by mouth daily.  Marland Kitchen. PARoxetine (PAXIL) 10 MG tablet Take 1 tablet (10 mg total) by mouth daily.  . simvastatin (ZOCOR) 20 MG tablet Take 1 tablet (20 mg total) by mouth daily at 6 PM.  . [DISCONTINUED] Glucosamine-MSM-Hyaluronic Acd (JOINT HEALTH PO) Take by mouth daily.   No facility-administered encounter medications on file as of 05/16/2019.     Activities of Daily Living In your present state of health, do you have any difficulty performing the following activities: 05/16/2019  Hearing? N  Vision? N  Difficulty concentrating or making decisions? N  Walking or climbing stairs? N  Dressing or bathing? N  Doing errands, shopping? N  Preparing Food and eating ? N  Using the Toilet? N  In the past six months, have you accidently leaked urine? N  Do you have problems with loss of bowel control? N  Managing your Medications? N  Managing your Finances? N  Housekeeping or managing your Housekeeping? N  Some recent data might be hidden    Patient Care Team: Myrlene Brokerrawford, Elizabeth A, MD as PCP - General (Internal Medicine) Valeria BatmanWhitfield, Peter W, MD as Consulting Physician (Orthopedic Surgery) Serena Colonelosen, Jefry, MD (Otolaryngology) Bjorn PippinWrenn, John, MD (Urology) Enid BaasFields, Karl, MD (Family Medicine) Santiago GladFreeman, Marshall, MD  (Specialist) Antoine PrimasSmith, Zachary, MD (Inactive) as Resident    Assessment:   This is a routine wellness examination for BakerLeslie. Physical assessment deferred to PCP.   Exercise Activities and Dietary recommendations Current Exercise Habits: The patient does not participate in regular exercise at present Diet (meal preparation, eat out, water intake, caffeinated beverages, dairy products, fruits and vegetables): in general, a "healthy" diet  , well balanced   Reviewed heart healthy diet. Encouraged patient to increase daily water and healthy fluid intake.  Goals      Patient Stated   . <enter goal here> (pt-stated)  To maintain current health.       Other   . Patient Stated     Lose weight by using portion control, monitor diet to include a lot of fruits and vegetables. Start to drink more water.       Fall Risk Fall Risk  05/16/2019 02/28/2018 03/01/2017 11/25/2016 02/23/2016  Falls in the past year? 0 No Yes Yes No  Number falls in past yr: 0 - 1 1 -  Injury with Fall? - - Yes No -  Follow up - - - Falls prevention discussed -    Depression Screen PHQ 2/9 Scores 05/16/2019 02/28/2018 03/01/2017 11/25/2016  PHQ - 2 Score 0 2 0 0  PHQ- 9 Score 0 2 - -     Cognitive Function       Ad8 score reviewed for issues:  Issues making decisions: no  Less interest in hobbies / activities: no  Repeats questions, stories (family complaining): no  Trouble using ordinary gadgets (microwave, computer, phone):no  Forgets the month or year: no  Mismanaging finances: no  Remembering appts: no  Daily problems with thinking and/or memory: no Ad8 score is= 0  Immunization History  Administered Date(s) Administered  . Influenza Split 10/06/2011  . Influenza Whole 08/19/2008, 08/28/2009, 08/31/2010  . Influenza, High Dose Seasonal PF 08/29/2013  . Influenza-Unspecified 09/13/2012, 08/22/2014, 09/26/2015, 08/29/2016, 08/26/2017, 08/29/2018  . Pneumococcal Conjugate-13 06/05/2015  .  Pneumococcal Polysaccharide-23 01/02/2014  . Td 10/19/2000, 11/02/2010  . Zoster 05/17/2015   Screening Tests Health Maintenance  Topic Date Due  . Hepatitis C Screening  12/08/2019 (Originally 05-05-1946)  . INFLUENZA VACCINE  06/30/2019  . MAMMOGRAM  03/22/2020  . TETANUS/TDAP  11/02/2020  . Fecal DNA (Cologuard)  12/20/2021  . DEXA SCAN  Completed  . PNA vac Low Risk Adult  Completed      Plan:    Reviewed health maintenance screenings with patient today and relevant education, vaccines, and/or referrals were provided.   Continue to eat heart healthy diet (full of fruits, vegetables, whole grains, lean protein, water--limit salt, fat, and sugar intake) and increase physical activity as tolerated.  Continue doing brain stimulating activities (puzzles, reading, adult coloring books, staying active) to keep memory sharp.   I have personally reviewed and noted the following in the patient's chart:   . Medical and social history . Use of alcohol, tobacco or illicit drugs  . Current medications and supplements . Functional ability and status . Nutritional status . Physical activity . Advanced directives . List of other physicians . Screenings to include cognitive, depression, and falls . Referrals and appointments  In addition, I have reviewed and discussed with patient certain preventive protocols, quality metrics, and best practice recommendations. A written personalized care plan for preventive services as well as general preventive health recommendations were provided to patient.     Michiel Cowboy, RN  05/16/2019  Medical screening examination/treatment/procedure(s) were performed by non-physician practitioner and as supervising physician I was immediately available for consultation/collaboration. I agree with above. Scarlette Calico, MD

## 2019-08-31 DIAGNOSIS — R69 Illness, unspecified: Secondary | ICD-10-CM | POA: Diagnosis not present

## 2019-10-02 DIAGNOSIS — H9113 Presbycusis, bilateral: Secondary | ICD-10-CM | POA: Diagnosis not present

## 2020-01-02 ENCOUNTER — Other Ambulatory Visit: Payer: Self-pay | Admitting: Internal Medicine

## 2020-01-12 ENCOUNTER — Ambulatory Visit: Payer: Medicare HMO | Attending: Internal Medicine

## 2020-01-12 DIAGNOSIS — Z23 Encounter for immunization: Secondary | ICD-10-CM | POA: Insufficient documentation

## 2020-01-12 NOTE — Progress Notes (Signed)
   Covid-19 Vaccination Clinic  Name:  RAINN ZUPKO    MRN: [id number] DOB: 1946-10-14  01/12/2020  Ms. Blanchett was observed post Covid-19 immunization for  15 minutes without incidence. She was provided with Vaccine Information Sheet and instruction to access the V-Safe system.   Ms. Buswell was instructed to call 911 with any severe reactions post vaccine: Marland Kitchen Difficulty breathing  . Swelling of your face and throat  . A fast heartbeat  . A bad rash all over your body  . Dizziness and weakness    Immunizations Administered    Name Date Dose VIS Date Route   Pfizer COVID-19 Vaccine 01/12/2020  8:35 AM 0.3 mL 11/09/2019 Intramuscular   Manufacturer: [employer], [employer]   Lot: [id number]   NDC: [address]-5

## 2020-01-21 ENCOUNTER — Other Ambulatory Visit: Payer: Self-pay | Admitting: Internal Medicine

## 2020-02-03 ENCOUNTER — Ambulatory Visit: Payer: Medicare HMO | Attending: Internal Medicine

## 2020-02-03 DIAGNOSIS — Z23 Encounter for immunization: Secondary | ICD-10-CM | POA: Insufficient documentation

## 2020-02-03 NOTE — Progress Notes (Signed)
   Covid-19 Vaccination Clinic  Name:  Tiffany House    MRN: [id number] DOB: Apr 27, 1946  02/03/2020  [name] was observed post Covid-19 immunization for 15 minutes without incident. She was provided with Vaccine Information Sheet and instruction to access the V-Safe system.   [name] was instructed to call 911 with any severe reactions post vaccine: Marland Kitchen Difficulty breathing  . Swelling of face and throat  . A fast heartbeat  . A bad rash all over body  . Dizziness and weakness   Immunizations Administered    Name Date Dose VIS Date Route   Pfizer COVID-19 Vaccine 02/03/2020 10:39 AM 0.3 mL 11/09/2019 Intramuscular   Manufacturer: [employer], [employer]   Lot: [id number]   NDC: [address]-4

## 2020-02-23 ENCOUNTER — Other Ambulatory Visit: Payer: Self-pay | Admitting: Internal Medicine

## 2020-03-01 ENCOUNTER — Other Ambulatory Visit: Payer: Self-pay | Admitting: Internal Medicine

## 2020-03-10 ENCOUNTER — Other Ambulatory Visit: Payer: Self-pay | Admitting: Internal Medicine

## 2020-04-07 ENCOUNTER — Other Ambulatory Visit: Payer: Self-pay | Admitting: Internal Medicine

## 2020-04-11 ENCOUNTER — Other Ambulatory Visit: Payer: Self-pay | Admitting: Internal Medicine

## 2020-04-24 ENCOUNTER — Other Ambulatory Visit: Payer: Self-pay | Admitting: Internal Medicine

## 2020-04-25 ENCOUNTER — Other Ambulatory Visit: Payer: Self-pay | Admitting: Internal Medicine

## 2020-04-25 ENCOUNTER — Encounter: Payer: Medicare HMO | Admitting: Internal Medicine

## 2020-04-25 DIAGNOSIS — Z0289 Encounter for other administrative examinations: Secondary | ICD-10-CM

## 2020-05-05 ENCOUNTER — Encounter: Payer: Medicare HMO | Admitting: Internal Medicine

## 2020-05-07 ENCOUNTER — Ambulatory Visit (INDEPENDENT_AMBULATORY_CARE_PROVIDER_SITE_OTHER): Payer: Medicare HMO | Admitting: Internal Medicine

## 2020-05-07 ENCOUNTER — Other Ambulatory Visit: Payer: Self-pay

## 2020-05-07 ENCOUNTER — Encounter: Payer: Self-pay | Admitting: Internal Medicine

## 2020-05-07 VITALS — BP 140/84 | HR 82 | Temp 98.4°F | Ht 60.0 in | Wt 142.0 lb

## 2020-05-07 DIAGNOSIS — R69 Illness, unspecified: Secondary | ICD-10-CM | POA: Diagnosis not present

## 2020-05-07 DIAGNOSIS — F4321 Adjustment disorder with depressed mood: Secondary | ICD-10-CM

## 2020-05-07 DIAGNOSIS — Z0000 Encounter for general adult medical examination without abnormal findings: Secondary | ICD-10-CM

## 2020-05-07 DIAGNOSIS — E782 Mixed hyperlipidemia: Secondary | ICD-10-CM

## 2020-05-07 DIAGNOSIS — E039 Hypothyroidism, unspecified: Secondary | ICD-10-CM

## 2020-05-07 DIAGNOSIS — K219 Gastro-esophageal reflux disease without esophagitis: Secondary | ICD-10-CM

## 2020-05-07 LAB — COMPREHENSIVE METABOLIC PANEL
ALT: 17 U/L (ref 0–35)
AST: 24 U/L (ref 0–37)
Albumin: 4.5 g/dL (ref 3.5–5.2)
Alkaline Phosphatase: 60 U/L (ref 39–117)
BUN: 26 mg/dL — ABNORMAL HIGH (ref 6–23)
CO2: 26 mEq/L (ref 19–32)
Calcium: 10 mg/dL (ref 8.4–10.5)
Chloride: 105 mEq/L (ref 96–112)
Creatinine, Ser: 1.12 mg/dL (ref 0.40–1.20)
GFR: 47.4 mL/min — ABNORMAL LOW (ref >60.00)
Glucose, Bld: 103 mg/dL — ABNORMAL HIGH (ref 70–99)
Potassium: 4 mEq/L (ref 3.5–5.1)
Sodium: 139 mEq/L (ref 135–145)
Total Bilirubin: 0.5 mg/dL (ref 0.2–1.2)
Total Protein: 7.1 g/dL (ref 6.0–8.3)

## 2020-05-07 LAB — LIPID PANEL
Cholesterol: 192 mg/dL (ref 0–200)
HDL: 48.2 mg/dL (ref >39.00)
LDL Cholesterol: 108 mg/dL — ABNORMAL HIGH (ref 0–99)
NonHDL: 143.66
Total CHOL/HDL Ratio: 4
Triglycerides: 178 mg/dL — ABNORMAL HIGH (ref 0.0–149.0)
VLDL: 35.6 mg/dL (ref 0.0–40.0)

## 2020-05-07 LAB — CBC
HCT: 36.6 % (ref 36.0–46.0)
Hemoglobin: 12 g/dL (ref 12.0–15.0)
MCHC: 32.9 g/dL (ref 30.0–36.0)
MCV: 86.9 fl (ref 78.0–100.0)
Platelets: 310 10*3/uL (ref 150.0–400.0)
RBC: 4.21 Mil/uL (ref 3.87–5.11)
RDW: 15.4 % (ref 11.5–15.5)
WBC: 8 10*3/uL (ref 4.0–10.5)

## 2020-05-07 LAB — TSH: TSH: 4.84 u[IU]/mL — ABNORMAL HIGH (ref 0.35–4.50)

## 2020-05-07 MED ORDER — SIMVASTATIN 20 MG PO TABS: 20.0000 mg | ORAL_TABLET | Freq: Every day | ORAL | 3 refills | Status: DC

## 2020-05-07 MED ORDER — PAROXETINE HCL 10 MG PO TABS: 10.0000 mg | ORAL_TABLET | Freq: Every day | ORAL | 3 refills | Status: DC

## 2020-05-07 MED ORDER — LEVOTHYROXINE SODIUM 25 MCG PO TABS: 25.0000 ug | ORAL_TABLET | Freq: Every day | ORAL | 3 refills | Status: DC

## 2020-05-07 MED ORDER — OMEPRAZOLE 40 MG PO CPDR: 40.0000 mg | DELAYED_RELEASE_CAPSULE | Freq: Every day | ORAL | 3 refills | Status: DC

## 2020-05-07 NOTE — Assessment & Plan Note (Signed)
Flu shot due next season. Covid-19 complete. Pneumonia complete. Shingrix counseled. Tetanus up to date. Cologuard due 2023. Mammogram up to date, pap smear aged out and dexa up to date. Counseled about sun safety and mole surveillance. Counseled about the dangers of distracted driving. Given 10 year screening recommendations.

## 2020-05-07 NOTE — Progress Notes (Signed)
   Subjective:   Patient ID: Tiffany House, female    DOB: [birth year], 74 y.o.   MRN: [id number]  HPI The patient is a 74 YO female coming in for physical.    PMH, FMH, social history reviewed and updated  Review of Systems  Constitutional: Negative.   HENT: Negative.   Eyes: Negative.   Respiratory: Negative for cough, chest tightness and shortness of breath.   Cardiovascular: Negative for chest pain, palpitations and leg swelling.  Gastrointestinal: Negative for abdominal distention, abdominal pain, constipation, diarrhea, nausea and vomiting.  Musculoskeletal: Negative.   Skin: Negative.   Neurological: Negative.   Psychiatric/Behavioral: Negative.     Objective:  Physical Exam Constitutional:      Appearance: She is well-developed.  HENT:     Head: Normocephalic and atraumatic.  Cardiovascular:     Rate and Rhythm: Normal rate and regular rhythm.  Pulmonary:     Effort: Pulmonary effort is normal. No respiratory distress.     Breath sounds: Normal breath sounds. No wheezing or rales.  Abdominal:     General: Bowel sounds are normal. There is no distension.     Palpations: Abdomen is soft.     Tenderness: There is no abdominal tenderness. There is no rebound.  Musculoskeletal:     Cervical back: Normal range of motion.  Skin:    General: Skin is warm and dry.  Neurological:     Mental Status: She is alert and oriented to person, place, and time.     Coordination: Coordination normal.     Vitals:   05/07/20 0858  BP: 140/84  Pulse: 82  Temp: 98.4 F (36.9 C)  TempSrc: Oral  SpO2: 96%  Weight: 142 lb (64.4 kg)  Height: 5' (1.524 m)    This visit occurred during the SARS-CoV-2 public health emergency.  Safety protocols were in place, including screening questions prior to the visit, additional usage of staff PPE, and extensive cleaning of exam room while observing appropriate contact time as indicated for disinfecting solutions.   Assessment & Plan:

## 2020-05-07 NOTE — Assessment & Plan Note (Signed)
Refill omeprazole for 1 year as she is doing well without side effects.

## 2020-05-07 NOTE — Assessment & Plan Note (Signed)
Checking lipid panel and adjust simvastatin 20 mg daily as needed.  

## 2020-05-07 NOTE — Patient Instructions (Signed)
Health Maintenance, Female Adopting a healthy lifestyle and getting preventive care are important in promoting health and wellness. Ask your health care provider about:  The right schedule for you to have regular tests and exams.  Things you can do on your own to prevent diseases and keep yourself healthy. What should I know about diet, weight, and exercise? Eat a healthy diet   Eat a diet that includes plenty of vegetables, fruits, low-fat dairy products, and lean protein.  Do not eat a lot of foods that are high in solid fats, added sugars, or sodium. Maintain a healthy weight Body mass index (BMI) is used to identify weight problems. It estimates body fat based on height and weight. Your health care provider can help determine your BMI and help you achieve or maintain a healthy weight. Get regular exercise Get regular exercise. This is one of the most important things you can do for your health. Most adults should:  Exercise for at least 150 minutes each week. The exercise should increase your heart rate and make you sweat (moderate-intensity exercise).  Do strengthening exercises at least twice a week. This is in addition to the moderate-intensity exercise.  Spend less time sitting. Even light physical activity can be beneficial. Watch cholesterol and blood lipids Have your blood tested for lipids and cholesterol at 75 years of age, then have this test every 5 years. Have your cholesterol levels checked more often if:  Your lipid or cholesterol levels are high.  You are older than 75 years of age.  You are at high risk for heart disease. What should I know about cancer screening? Depending on your health history and family history, you may need to have cancer screening at various ages. This may include screening for:  Breast cancer.  Cervical cancer.  Colorectal cancer.  Skin cancer.  Lung cancer. What should I know about heart disease, diabetes, and high blood  pressure? Blood pressure and heart disease  High blood pressure causes heart disease and increases the risk of stroke. This is more likely to develop in people who have high blood pressure readings, are of African descent, or are overweight.  Have your blood pressure checked: ? Every 3-5 years if you are 18-39 years of age. ? Every year if you are 40 years old or older. Diabetes Have regular diabetes screenings. This checks your fasting blood sugar level. Have the screening done:  Once every three years after age 40 if you are at a normal weight and have a low risk for diabetes.  More often and at a younger age if you are overweight or have a high risk for diabetes. What should I know about preventing infection? Hepatitis B If you have a higher risk for hepatitis B, you should be screened for this virus. Talk with your health care provider to find out if you are at risk for hepatitis B infection. Hepatitis C Testing is recommended for:  Everyone born from 1945 through 1965.  Anyone with known risk factors for hepatitis C. Sexually transmitted infections (STIs)  Get screened for STIs, including gonorrhea and chlamydia, if: ? You are sexually active and are younger than 75 years of age. ? You are older than 75 years of age and your health care provider tells you that you are at risk for this type of infection. ? Your sexual activity has changed since you were last screened, and you are at increased risk for chlamydia or gonorrhea. Ask your health care provider if   you are at risk.  Ask your health care provider about whether you are at high risk for HIV. Your health care provider may recommend a prescription medicine to help prevent HIV infection. If you choose to take medicine to prevent HIV, you should first get tested for HIV. You should then be tested every 3 months for as long as you are taking the medicine. Pregnancy  If you are about to stop having your period (premenopausal) and  you may become pregnant, seek counseling before you get pregnant.  Take 400 to 800 micrograms (mcg) of folic acid every day if you become pregnant.  Ask for birth control (contraception) if you want to prevent pregnancy. Osteoporosis and menopause Osteoporosis is a disease in which the bones lose minerals and strength with aging. This can result in bone fractures. If you are 65 years old or older, or if you are at risk for osteoporosis and fractures, ask your health care provider if you should:  Be screened for bone loss.  Take a calcium or vitamin D supplement to lower your risk of fractures.  Be given hormone replacement therapy (HRT) to treat symptoms of menopause. Follow these instructions at home: Lifestyle  Do not use any products that contain nicotine or tobacco, such as cigarettes, e-cigarettes, and chewing tobacco. If you need help quitting, ask your health care provider.  Do not use street drugs.  Do not share needles.  Ask your health care provider for help if you need support or information about quitting drugs. Alcohol use  Do not drink alcohol if: ? Your health care provider tells you not to drink. ? You are pregnant, may be pregnant, or are planning to become pregnant.  If you drink alcohol: ? Limit how much you use to 0-1 drink a day. ? Limit intake if you are breastfeeding.  Be aware of how much alcohol is in your drink. In the [place] one drink equals one 12 oz bottle of beer (355 mL), one 5 oz glass of wine (148 mL), or one 1 oz glass of hard liquor (44 mL). General instructions  Schedule regular health, dental, and eye exams.  Stay current with your vaccines.  Tell your health care provider if: ? You often feel depressed. ? You have ever been abused or do not feel safe at home. Summary  Adopting a healthy lifestyle and getting preventive care are important in promoting health and wellness.  Follow your health care provider's instructions about healthy  diet, exercising, and getting tested or screened for diseases.  Follow your health care provider's instructions on monitoring your cholesterol and blood pressure. This information is not intended to replace advice given to you by your health care provider. Make sure you discuss any questions you have with your health care provider. Document Revised: 11/08/2018 Document Reviewed: 11/08/2018 Elsevier Patient Education  2020 [employer]vier Inc.  

## 2020-05-07 NOTE — Assessment & Plan Note (Signed)
Refill paxil 10 mg daily and doing well overall.

## 2020-05-07 NOTE — Assessment & Plan Note (Signed)
Checking TSH and adjust synthroid 25 mcg daily as needed.  ?

## 2020-07-28 DIAGNOSIS — Z809 Family history of malignant neoplasm, unspecified: Secondary | ICD-10-CM | POA: Diagnosis not present

## 2020-07-28 DIAGNOSIS — Z882 Allergy status to sulfonamides status: Secondary | ICD-10-CM | POA: Diagnosis not present

## 2020-07-28 DIAGNOSIS — R030 Elevated blood-pressure reading, without diagnosis of hypertension: Secondary | ICD-10-CM | POA: Diagnosis not present

## 2020-07-28 DIAGNOSIS — R69 Illness, unspecified: Secondary | ICD-10-CM | POA: Diagnosis not present

## 2020-07-28 DIAGNOSIS — K219 Gastro-esophageal reflux disease without esophagitis: Secondary | ICD-10-CM | POA: Diagnosis not present

## 2020-07-28 DIAGNOSIS — E785 Hyperlipidemia, unspecified: Secondary | ICD-10-CM | POA: Diagnosis not present

## 2020-07-28 DIAGNOSIS — E039 Hypothyroidism, unspecified: Secondary | ICD-10-CM | POA: Diagnosis not present

## 2020-07-28 DIAGNOSIS — M1990 Unspecified osteoarthritis, unspecified site: Secondary | ICD-10-CM | POA: Diagnosis not present

## 2020-07-28 DIAGNOSIS — Z8249 Family history of ischemic heart disease and other diseases of the circulatory system: Secondary | ICD-10-CM | POA: Diagnosis not present

## 2020-07-28 DIAGNOSIS — G8929 Other chronic pain: Secondary | ICD-10-CM | POA: Diagnosis not present

## 2020-08-08 DIAGNOSIS — R69 Illness, unspecified: Secondary | ICD-10-CM | POA: Diagnosis not present

## 2020-12-02 ENCOUNTER — Other Ambulatory Visit: Payer: Self-pay

## 2020-12-02 ENCOUNTER — Ambulatory Visit: Payer: Self-pay

## 2020-12-02 ENCOUNTER — Encounter: Payer: Self-pay | Admitting: Family Medicine

## 2020-12-02 ENCOUNTER — Ambulatory Visit: Payer: Medicare HMO | Admitting: Family Medicine

## 2020-12-02 ENCOUNTER — Ambulatory Visit (INDEPENDENT_AMBULATORY_CARE_PROVIDER_SITE_OTHER): Payer: Medicare HMO

## 2020-12-02 VITALS — BP 142/84 | HR 75 | Ht 60.0 in | Wt 146.0 lb

## 2020-12-02 DIAGNOSIS — M1711 Unilateral primary osteoarthritis, right knee: Secondary | ICD-10-CM

## 2020-12-02 DIAGNOSIS — G8929 Other chronic pain: Secondary | ICD-10-CM | POA: Diagnosis not present

## 2020-12-02 DIAGNOSIS — M25561 Pain in right knee: Secondary | ICD-10-CM

## 2020-12-02 NOTE — Patient Instructions (Addendum)
Good to see you Knee brace for the right knee Arnica lotion 2 times a day for the knee Ice 20 mins 2 times a day Knee xray on the way out See me again in 5-6 weeks

## 2020-12-02 NOTE — Assessment & Plan Note (Signed)
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  X-rays are pending.  He does have pain on the anterior aspect of the knee and will rule out any type of fracture but patient is able to increase activity.  Tru pull lite given.  On ultrasound today was found to have a small effusion.  Given home exercises.  Worsening pain will consider the possibility of injections or physical therapy at follow-up in 4 to 8 weeks

## 2020-12-02 NOTE — Progress Notes (Signed)
[name] [address] [place] [address] Phone: [phone] Subjective:   I Tiffany House am serving as a Neurosurgeon for Dr. Antoine Primas.  This visit occurred during the SARS-CoV-2 public health emergency.  Safety protocols were in place, including screening questions prior to the visit, additional usage of staff PPE, and extensive cleaning of exam room while observing appropriate contact time as indicated for disinfecting solutions.   I'm seeing this patient by the request  of:  Myrlene Broker, MD  CC: Right knee pain  ION:[id number]   01/22/2015 Patient does have mild to moderate osteophytic changes of the right knee. Patient did try the Orthovisc injections and did not make as much's improvement as we would like to see. We discussed the possibility of further imaging including an MRI to further evaluate the amount of arthritis.  Update 12/02/2020 Tiffany House is a 75 y.o. female coming in with complaint of right knee pain. Patient states the second week of September she tripped over her living room carpet. Landed hard on the right knee. 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 In October she felt swelling on the knee while on a trip to [place]. The last week or so it has been painful. TTP. Driving causing pain. Has used voltaren and tylenol. Knee cap is most painful at about the patellar tendon. 4-5/10 at its worse. Weakness in the knees before the fall.       Past Medical History:  Diagnosis Date  . Allergic rhinitis, cause unspecified   . Arthritis   . Benign positional vertigo   . GERD   . HYPERLIPIDEMIA   . Hypertension   . HYPOTHYROIDISM dx 10/2010  . Low back pain    MRI 02/2011  . Migraines   . OSTEOPENIA   . Osteoporosis    Qualifier: Diagnosis of  By: Nena Jordan   . RENAL CALCULUS, RIGHT    L 01/2012   Past Surgical History:  Procedure Laterality Date  . HAND SURGERY  2001   Dr Teressa Senter-- surgery of right hand to  rebuild joint  . HERNIA REPAIR  03/2016  . Kidney stone removed  01/12/10  . TUBAL LIGATION  1979   Social History   Socioeconomic History  . Marital status: Widowed    Spouse name: Not on file  . Number of children: 0  . Years of education: Not on file  . Highest education level: Not on file  Occupational History  . 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 Smoking status: Never Smoker  . Smokeless tobacco: Never Used  Vaping Use  . Vaping Use: Never used  Substance and Sexual Activity  . Alcohol use: Yes    Comment: occasional  . Drug use: No  . Sexual activity: Never  Other Topics Concern  . Not on file  Social History Narrative  . Not on file   Social Determinants of Health   Financial Resource Strain: Not on file  Food Insecurity: Not on file  Transportation Needs: Not on file  Physical Activity: Not on file  Stress: Not on file  Social Connections: Not on file   Allergies  Allergen Reactions  . Shellfish Allergy Nausea And Vomiting    Scallops and oysters  . Sulfonamide Derivatives     Unknown allergy   Family History  Problem Relation Age of Onset  . Hyperlipidemia Mother   . Hypertension Mother   .  Cancer Father        prostate. [name]  . Coronary artery disease Father   . Hypertension Father     Current Outpatient Medications (Endocrine & Metabolic):  .  levothyroxine (SYNTHROID) 25 MCG tablet, Take 1 tablet (25 mcg total) by mouth daily.  Current Outpatient Medications (Cardiovascular):  .  simvastatin (ZOCOR) 20 MG tablet, Take 1 tablet (20 mg total) by mouth daily at 6 PM.   Current Outpatient Medications (Analgesics):  .  acetaminophen (TYLENOL) 325 MG tablet, Take 650 mg by mouth every 6 (six) hours as needed.   Current Outpatient Medications (Other):  .  calcium carbonate (OS-CAL - DOSED IN MG OF ELEMENTAL CALCIUM) 1250 (500 Ca) MG tablet, Take 2 tablets by mouth daily. .  Cholecalciferol (VITAMIN D-3)  1000 UNITS CAPS, Take by mouth. Take one tablet twice daily .  diclofenac sodium (VOLTAREN) 1 % GEL, Apply 2 g topically 4 (four) times daily. .  fish oil-omega-3 fatty acids 1000 MG capsule, Take 2 g by mouth 2 (two) times daily. .  Multiple Vitamin (MULTIVITAMIN) tablet, Take 1 tablet by mouth daily. .  multivitamin-lutein (OCUVITE-LUTEIN) CAPS capsule, Take 1 capsule by mouth daily. Marland Kitchen  omeprazole (PRILOSEC) 40 MG capsule, Take 1 capsule (40 mg total) by mouth daily. Marland Kitchen  PARoxetine (PAXIL) 10 MG tablet, Take 1 tablet (10 mg total) by mouth daily.   Reviewed prior external information including notes and imaging from  primary care provider As well as notes that were available from care everywhere and other healthcare systems.  Past medical history, social, surgical and family history all reviewed in electronic medical record.  No pertanent information unless stated regarding to the chief complaint.   Review of Systems:  No headache, visual changes, nausea, vomiting, diarrhea, constipation, dizziness, abdominal pain, skin rash, fevers, chills, night sweats, weight loss, swollen lymph nodes, body aches, joint swelling, chest pain, shortness of breath, mood changes. POSITIVE muscle aches  Objective  Blood pressure (!) 142/84, pulse 75, height 5' (1.524 m), weight 146 lb (66.2 kg), SpO2 98 %.   General: No apparent distress alert and oriented x3 mood and affect normal, dressed appropriately.  HEENT: Pupils equal, extraocular movements intact  Respiratory: Patient's speak in full sentences and does not appear short of breath  Cardiovascular: No lower extremity edema, non tender, no erythema  Very mild antalgic gait Patient right knee severely tender to palpation of the anterior inferior aspect of the patella.  Very small area where it is the most tender.  Patient though does have trace effusion of the knee noted.  Lacks the last 5 degrees of extension in the last 7 to 10 degrees of flexion.   Mild instability with valgus and varus force.  Limited musculoskeletal ultrasound was performed and interpreted by Judi Saa   Patient patella does not have a true cortical irregularity but does have some hypoechoic changes of the bone that is consistent with a potential contusion.  Patient does have what appears to be an effusion of the patellofemoral joint and moderate to severe narrowing of the patellofemoral joint.  Mild to moderate narrowing of the lateral compartment the medial compartment relatively unchanged. Impression: Questionable patellar contusion with underlying patellofemoral arthritis      Impression and Recommendations:     The above documentation has been reviewed and is accurate and complete Judi Saa, DO

## 2020-12-08 ENCOUNTER — Telehealth: Payer: Self-pay

## 2020-12-08 NOTE — Telephone Encounter (Signed)
Patient coming tomorrow after 10 to be fitted for knee brace.

## 2020-12-09 DIAGNOSIS — M25561 Pain in right knee: Secondary | ICD-10-CM | POA: Diagnosis not present

## 2020-12-09 DIAGNOSIS — M1711 Unilateral primary osteoarthritis, right knee: Secondary | ICD-10-CM | POA: Diagnosis not present

## 2020-12-09 DIAGNOSIS — G8929 Other chronic pain: Secondary | ICD-10-CM | POA: Diagnosis not present

## 2021-01-06 ENCOUNTER — Ambulatory Visit: Payer: Medicare HMO | Admitting: Family Medicine

## 2021-01-28 ENCOUNTER — Telehealth: Payer: Self-pay | Admitting: Internal Medicine

## 2021-01-28 ENCOUNTER — Encounter: Payer: Self-pay | Admitting: Internal Medicine

## 2021-01-28 ENCOUNTER — Other Ambulatory Visit: Payer: Self-pay

## 2021-01-28 ENCOUNTER — Ambulatory Visit (INDEPENDENT_AMBULATORY_CARE_PROVIDER_SITE_OTHER): Payer: Medicare HMO | Admitting: Internal Medicine

## 2021-01-28 VITALS — BP 140/88 | HR 80 | Ht 60.0 in | Wt 145.0 lb

## 2021-01-28 DIAGNOSIS — K219 Gastro-esophageal reflux disease without esophagitis: Secondary | ICD-10-CM | POA: Diagnosis not present

## 2021-01-28 DIAGNOSIS — Z23 Encounter for immunization: Secondary | ICD-10-CM

## 2021-01-28 DIAGNOSIS — J069 Acute upper respiratory infection, unspecified: Secondary | ICD-10-CM

## 2021-01-28 DIAGNOSIS — E039 Hypothyroidism, unspecified: Secondary | ICD-10-CM

## 2021-01-28 MED ORDER — AZITHROMYCIN 250 MG PO TABS: ORAL_TABLET | ORAL | 0 refills | Status: DC

## 2021-01-28 NOTE — Progress Notes (Signed)
Patient ID: Tiffany House, female   DOB: Jul 25, 1946, 75 y.o.   MRN: [id number]        Chief Complaint: follow up ST       HPI:  Tiffany House is a 75 y.o. female here with c/o 2-3 days onset mild left sided only ST with swallowing, with generalized weakness and feeling poorly, but no fever, chills, or worsening allergy symptoms   Has not yet been covid tested, has had covid vax x 2 + booster.  No hx of covid infection.  Has noticed also some neck swelilng and tender near the angle of left jaw.  Has had some worsening reflux symptoms recently despite good med compliance with antacid.    Pt denies chest pain, increased sob or doe, wheezing, orthopnea, PND, increased LE swelling, palpitations, dizziness or syncope.   Pt denies polydipsia, polyuria,  Pt denies fever, wt loss, night sweats, loss of appetite, or other constitutional symptoms   Denies hyper or hypo thyroid symptoms such as voice, skin or hair change.  Due for tdap today Wt Readings from Last 3 Encounters:  01/28/21 145 lb (65.8 kg)  12/02/20 146 lb (66.2 kg)  05/07/20 142 lb (64.4 kg)   BP Readings from Last 3 Encounters:  01/28/21 140/88  12/02/20 (!) 142/84  05/07/20 140/84         Past Medical History:  Diagnosis Date  . Allergic rhinitis, cause unspecified   . Arthritis   . Benign positional vertigo   . GERD   . HYPERLIPIDEMIA   . Hypertension   . HYPOTHYROIDISM dx 10/2010  . Low back pain    MRI 02/2011  . Migraines   . OSTEOPENIA   . Osteoporosis    Qualifier: Diagnosis of  By: Nena Jordan   . RENAL CALCULUS, RIGHT    L 01/2012   Past Surgical History:  Procedure Laterality Date  . HAND SURGERY  2001   Dr Teressa Senter-- surgery of right hand to rebuild joint  . HERNIA REPAIR  03/2016  . Kidney stone removed  01/12/10  . TUBAL LIGATION  1979    reports that she has never smoked. She has never used smokeless tobacco. She reports current alcohol use. She reports that she does not use drugs. family history includes  Cancer in her father; Coronary artery disease in her father; Hyperlipidemia in her mother; Hypertension in her father and mother. Allergies  Allergen Reactions  . Shellfish Allergy Nausea And Vomiting    Scallops and oysters  . Sulfonamide Derivatives     Unknown allergy   Current Outpatient Medications on File Prior to Visit  Medication Sig Dispense Refill  . acetaminophen (TYLENOL) 325 MG tablet Take 650 mg by mouth every 6 (six) hours as needed.    . calcium carbonate (OS-CAL - DOSED IN MG OF ELEMENTAL CALCIUM) 1250 (500 Ca) MG tablet Take 2 tablets by mouth daily.    . Cholecalciferol (VITAMIN D-3) 1000 UNITS CAPS Take by mouth. Take one tablet twice daily    . diclofenac sodium (VOLTAREN) 1 % GEL Apply 2 g topically 4 (four) times daily. 100 g 3  . fish oil-omega-3 fatty acids 1000 MG capsule Take 2 g by mouth 2 (two) times daily.    Marland Kitchen levothyroxine (SYNTHROID) 25 MCG tablet Take 1 tablet (25 mcg total) by mouth daily. 90 tablet 3  . Multiple Vitamin (MULTIVITAMIN) tablet Take 1 tablet by mouth daily.    . multivitamin-lutein (OCUVITE-LUTEIN) CAPS capsule Take 1  capsule by mouth daily.    Marland Kitchen omeprazole (PRILOSEC) 40 MG capsule Take 1 capsule (40 mg total) by mouth daily. 90 capsule 3  . PARoxetine (PAXIL) 10 MG tablet Take 1 tablet (10 mg total) by mouth daily. 90 tablet 3  . simvastatin (ZOCOR) 20 MG tablet Take 1 tablet (20 mg total) by mouth daily at 6 PM. 90 tablet 3   No current facility-administered medications on file prior to visit.        ROS:  All others reviewed and negative.  Objective        PE:  BP 140/88   Pulse 80   Ht 5' (1.524 m)   Wt 145 lb (65.8 kg)   SpO2 98%   BMI 28.32 kg/m                 Constitutional: Pt appears in NAD               HENT: Head: NCAT.                Right Ear: External ear normal.                 Left Ear: External ear normal.                Eyes: . Pupils are equal, round, and reactive to light. Conjunctivae and EOM are normal                Nose: without d/c or deformity               Neck: Neck supple. Gross normal ROM, but with nondiscrete tender swelling and vague LA noted submandibular near the angle left jaw               Cardiovascular: Normal rate and regular rhythm.                 Pulmonary/Chest: Effort normal and breath sounds without rales or wheezing.                Abd:  Soft, NT, ND, + BS, no organomegaly               Neurological: Pt is alert. At baseline orientation, motor grossly intact               Skin: Skin is warm. No rashes, no other new lesions, LE edema - none               Psychiatric: Pt behavior is normal without agitation   Micro: none  Cardiac tracings I have personally interpreted today:  none  Pertinent Radiological findings (summarize): none   Lab Results  Component Value Date   WBC 8.0 05/07/2020   HGB 12.0 05/07/2020   HCT 36.6 05/07/2020   PLT 310.0 05/07/2020   GLUCOSE 103 (H) 05/07/2020   CHOL 192 05/07/2020   TRIG 178.0 (H) 05/07/2020   HDL 48.20 05/07/2020   LDLDIRECT 124.0 12/07/2018   LDLCALC 108 (H) 05/07/2020   ALT 17 05/07/2020   AST 24 05/07/2020   NA 139 05/07/2020   K 4.0 05/07/2020   CL 105 05/07/2020   CREATININE 1.12 05/07/2020   BUN 26 (H) 05/07/2020   CO2 26 05/07/2020   TSH 4.84 (H) 05/07/2020   HGBA1C 5.3 08/27/2015   Assessment/Plan:  Tiffany House is a 75 y.o. White or Caucasian [1] female with  has a past medical history of Allergic rhinitis, cause unspecified, Arthritis, Benign  positional vertigo, GERD, HYPERLIPIDEMIA, Hypertension, HYPOTHYROIDISM (dx 10/2010), Low back pain, Migraines, OSTEOPENIA, Osteoporosis, and RENAL CALCULUS, RIGHT.  URI (upper respiratory infection) Mild to mod, for antibx course,  to f/u any worsening symptoms or concerns  GERD Mild uncontrolled, not likely to be associated with current chief complaint, cont current tx and tums prn  Hypothyroidism Lab Results  Component Value Date   TSH 4.84 (H) 05/07/2020    Stable, pt to continue levothyroxine  Current Outpatient Medications (Endocrine & Metabolic):  .  levothyroxine (SYNTHROID) 25 MCG tablet, Take 1 tablet (25 mcg total) by mouth daily.  Current Outpatient Medications (Cardiovascular):  .  simvastatin (ZOCOR) 20 MG tablet, Take 1 tablet (20 mg total) by mouth daily at 6 PM.   Current Outpatient Medications (Analgesics):  .  acetaminophen (TYLENOL) 325 MG tablet, Take 650 mg by mouth every 6 (six) hours as needed.   Current Outpatient Medications (Other):  .  azithromycin (ZITHROMAX Z-PAK) 250 MG tablet, 2 tab by mouth day1, then 1 per day .  calcium carbonate (OS-CAL - DOSED IN MG OF ELEMENTAL CALCIUM) 1250 (500 Ca) MG tablet, Take 2 tablets by mouth daily. .  Cholecalciferol (VITAMIN D-3) 1000 UNITS CAPS, Take by mouth. Take one tablet twice daily .  diclofenac sodium (VOLTAREN) 1 % GEL, Apply 2 g topically 4 (four) times daily. .  fish oil-omega-3 fatty acids 1000 MG capsule, Take 2 g by mouth 2 (two) times daily. .  Multiple Vitamin (MULTIVITAMIN) tablet, Take 1 tablet by mouth daily. .  multivitamin-lutein (OCUVITE-LUTEIN) CAPS capsule, Take 1 capsule by mouth daily. Marland Kitchen  omeprazole (PRILOSEC) 40 MG capsule, Take 1 capsule (40 mg total) by mouth daily. Marland Kitchen  PARoxetine (PAXIL) 10 MG tablet, Take 1 tablet (10 mg total) by mouth daily.   Followup: Return if symptoms worsen or fail to improve.  Oliver Barre, MD 02/04/2021 9:28 AM [facility]

## 2021-01-28 NOTE — Patient Instructions (Signed)
You had the Tdap today  Please take all new medication as prescribed - the antibiotic  Please do the home COVID testing and let us know the result  Please continue all other medications as before, and refills have been done if requested.  Please have the pharmacy call with any other refills you may need  Please keep your appointments with your specialists as you may have planned

## 2021-01-28 NOTE — Telephone Encounter (Signed)
Patient calling to report negative covid test 01/28/21

## 2021-02-04 ENCOUNTER — Encounter: Payer: Self-pay | Admitting: Internal Medicine

## 2021-02-04 NOTE — Assessment & Plan Note (Signed)
Mild uncontrolled, not likely to be associated with current chief complaint, cont current tx and tums prn

## 2021-02-04 NOTE — Assessment & Plan Note (Signed)
Mild to mod, for antibx course,  to f/u any worsening symptoms or concerns 

## 2021-02-04 NOTE — Assessment & Plan Note (Signed)
Lab Results  Component Value Date   TSH 4.84 (H) 05/07/2020   Stable, pt to continue levothyroxine  Current Outpatient Medications (Endocrine & Metabolic):  .  levothyroxine (SYNTHROID) 25 MCG tablet, Take 1 tablet (25 mcg total) by mouth daily.  Current Outpatient Medications (Cardiovascular):  .  simvastatin (ZOCOR) 20 MG tablet, Take 1 tablet (20 mg total) by mouth daily at 6 PM.   Current Outpatient Medications (Analgesics):  .  acetaminophen (TYLENOL) 325 MG tablet, Take 650 mg by mouth every 6 (six) hours as needed.   Current Outpatient Medications (Other):  .  azithromycin (ZITHROMAX Z-PAK) 250 MG tablet, 2 tab by mouth day1, then 1 per day .  calcium carbonate (OS-CAL - DOSED IN MG OF ELEMENTAL CALCIUM) 1250 (500 Ca) MG tablet, Take 2 tablets by mouth daily. .  Cholecalciferol (VITAMIN D-3) 1000 UNITS CAPS, Take by mouth. Take one tablet twice daily .  diclofenac sodium (VOLTAREN) 1 % GEL, Apply 2 g topically 4 (four) times daily. .  fish oil-omega-3 fatty acids 1000 MG capsule, Take 2 g by mouth 2 (two) times daily. .  Multiple Vitamin (MULTIVITAMIN) tablet, Take 1 tablet by mouth daily. .  multivitamin-lutein (OCUVITE-LUTEIN) CAPS capsule, Take 1 capsule by mouth daily. Marland Kitchen  omeprazole (PRILOSEC) 40 MG capsule, Take 1 capsule (40 mg total) by mouth daily. Marland Kitchen  PARoxetine (PAXIL) 10 MG tablet, Take 1 tablet (10 mg total) by mouth daily.

## 2021-02-24 DIAGNOSIS — J029 Acute pharyngitis, unspecified: Secondary | ICD-10-CM | POA: Diagnosis not present

## 2021-02-24 DIAGNOSIS — J3489 Other specified disorders of nose and nasal sinuses: Secondary | ICD-10-CM | POA: Diagnosis not present

## 2021-02-24 DIAGNOSIS — H9113 Presbycusis, bilateral: Secondary | ICD-10-CM | POA: Diagnosis not present

## 2021-02-24 DIAGNOSIS — K219 Gastro-esophageal reflux disease without esophagitis: Secondary | ICD-10-CM | POA: Diagnosis not present

## 2021-02-24 DIAGNOSIS — Z974 Presence of external hearing-aid: Secondary | ICD-10-CM | POA: Diagnosis not present

## 2021-02-24 DIAGNOSIS — J342 Deviated nasal septum: Secondary | ICD-10-CM | POA: Diagnosis not present

## 2021-02-25 ENCOUNTER — Emergency Department (HOSPITAL_COMMUNITY): Payer: Medicare HMO

## 2021-02-25 ENCOUNTER — Emergency Department (HOSPITAL_COMMUNITY)
Admission: EM | Admit: 2021-02-25 | Discharge: 2021-02-25 | Disposition: A | Discharge status: Home / Self Care | Payer: Medicare HMO | Attending: Emergency Medicine | Admitting: Emergency Medicine

## 2021-02-25 DIAGNOSIS — K219 Gastro-esophageal reflux disease without esophagitis: Secondary | ICD-10-CM | POA: Diagnosis not present

## 2021-02-25 DIAGNOSIS — Z79899 Other long term (current) drug therapy: Secondary | ICD-10-CM | POA: Diagnosis not present

## 2021-02-25 DIAGNOSIS — K5792 Diverticulitis of intestine, part unspecified, without perforation or abscess without bleeding: Secondary | ICD-10-CM

## 2021-02-25 DIAGNOSIS — E039 Hypothyroidism, unspecified: Secondary | ICD-10-CM | POA: Insufficient documentation

## 2021-02-25 DIAGNOSIS — R1031 Right lower quadrant pain: Secondary | ICD-10-CM | POA: Diagnosis not present

## 2021-02-25 DIAGNOSIS — I10 Essential (primary) hypertension: Secondary | ICD-10-CM | POA: Diagnosis not present

## 2021-02-25 LAB — CBC WITH DIFFERENTIAL/PLATELET
Abs Immature Granulocytes: 0.03 10*3/uL (ref 0.00–0.07)
Basophils Absolute: 0.1 10*3/uL (ref 0.0–0.1)
Basophils Relative: 1 %
Eosinophils Absolute: 0.3 10*3/uL (ref 0.0–0.5)
Eosinophils Relative: 3 %
HCT: 36.6 % (ref 36.0–46.0)
Hemoglobin: 11.5 g/dL — ABNORMAL LOW (ref 12.0–15.0)
Immature Granulocytes: 0 %
Lymphocytes Relative: 26 %
Lymphs Abs: 3.4 10*3/uL (ref 0.7–4.0)
MCH: 27.3 pg (ref 26.0–34.0)
MCHC: 31.4 g/dL (ref 30.0–36.0)
MCV: 86.9 fL (ref 80.0–100.0)
Monocytes Absolute: 1 10*3/uL (ref 0.1–1.0)
Monocytes Relative: 8 %
Neutro Abs: 8 10*3/uL — ABNORMAL HIGH (ref 1.7–7.7)
Neutrophils Relative %: 62 %
Platelets: 351 10*3/uL (ref 150–400)
RBC: 4.21 MIL/uL (ref 3.87–5.11)
RDW: 14.6 % (ref 11.5–15.5)
WBC: 12.8 10*3/uL — ABNORMAL HIGH (ref 4.0–10.5)
nRBC: 0 % (ref 0.0–0.2)

## 2021-02-25 LAB — URINALYSIS, ROUTINE W REFLEX MICROSCOPIC
Bilirubin Urine: NEGATIVE
Glucose, UA: NEGATIVE mg/dL
Hgb urine dipstick: NEGATIVE
Ketones, ur: NEGATIVE mg/dL
Leukocytes,Ua: NEGATIVE
Nitrite: NEGATIVE
Protein, ur: NEGATIVE mg/dL
Specific Gravity, Urine: 1.016 (ref 1.005–1.030)
pH: 7 (ref 5.0–8.0)

## 2021-02-25 LAB — COMPREHENSIVE METABOLIC PANEL
ALT: 21 U/L (ref 0–44)
AST: 23 U/L (ref 15–41)
Albumin: 3.9 g/dL (ref 3.5–5.0)
Alkaline Phosphatase: 66 U/L (ref 38–126)
Anion gap: 9 (ref 5–15)
BUN: 20 mg/dL (ref 8–23)
CO2: 23 mmol/L (ref 22–32)
Calcium: 9.9 mg/dL (ref 8.9–10.3)
Chloride: 106 mmol/L (ref 98–111)
Creatinine, Ser: 1.06 mg/dL — ABNORMAL HIGH (ref 0.44–1.00)
GFR, Estimated: 54 mL/min — ABNORMAL LOW (ref >60)
Glucose, Bld: 107 mg/dL — ABNORMAL HIGH (ref 70–99)
Potassium: 4.7 mmol/L (ref 3.5–5.1)
Sodium: 138 mmol/L (ref 135–145)
Total Bilirubin: 0.6 mg/dL (ref 0.3–1.2)
Total Protein: 7 g/dL (ref 6.5–8.1)

## 2021-02-25 LAB — LIPASE, BLOOD: Lipase: 36 U/L (ref 11–51)

## 2021-02-25 MED ORDER — DICYCLOMINE HCL 20 MG PO TABS: 20.0000 mg | ORAL_TABLET | Freq: Two times a day (BID) | ORAL | 0 refills | Status: DC | PRN

## 2021-02-25 MED ORDER — ONDANSETRON 4 MG PO TBDP: 4.0000 mg | ORAL_TABLET | Freq: Three times a day (TID) | ORAL | 0 refills | Status: DC | PRN

## 2021-02-25 MED ORDER — IOHEXOL 300 MG/ML  SOLN
100.0000 mL | Freq: Once | INTRAMUSCULAR | Status: AC | PRN
  Administered 2021-02-25: 100 mL via INTRAVENOUS

## 2021-02-25 MED ORDER — METRONIDAZOLE 500 MG PO TABS: 500.0000 mg | ORAL_TABLET | Freq: Three times a day (TID) | ORAL | 0 refills | Status: AC

## 2021-02-25 MED ORDER — SODIUM CHLORIDE 0.9 % IV BOLUS
1000.0000 mL | Freq: Once | INTRAVENOUS | Status: AC
  Administered 2021-02-25: 1000 mL via INTRAVENOUS

## 2021-02-25 MED ORDER — HYDROCODONE-ACETAMINOPHEN 5-325 MG PO TABS: 1.0000 | ORAL_TABLET | ORAL | 0 refills | Status: DC | PRN

## 2021-02-25 MED ORDER — CIPROFLOXACIN HCL 500 MG PO TABS: 500.0000 mg | ORAL_TABLET | Freq: Two times a day (BID) | ORAL | 0 refills | Status: AC

## 2021-02-25 NOTE — ED Notes (Signed)
Patient verbalized understanding of discharge instructions. Opportunity for questions and answers.  

## 2021-02-25 NOTE — ED Provider Notes (Signed)
[facility] EMERGENCY DEPARTMENT Provider Note   CSN: [id number] Arrival date & time: 02/25/21  1336     History No chief complaint on file.   Tiffany House is a 75 y.o. female.  HPI      75 year old female with history of hypertension, hyperlipidemia, migraines, hypothyroidism, presents with concern for right-sided abdominal pain.  Reports right lower quadrant abdominal pain, nausea without vomiting.  Began yesterday around 2 PM.  Reports that severe, 8.5 out of 10 pain that is stabbing in nature located in her right lower quadrant, waxing and waning.  Reports nothing seems to make it better or worse.  Reports he did take Tylenol prior to arrival and declines pain or other pain medications at this time.  Denies diarrhea, constipation, dysuria, hematuria, vomiting, fevers or chills.  Reports she does have a history of kidney stones for which she had surgery for in the past, although this pain does not seem as severe as that.  She does still have her gallbladder and her appendix.  Past Medical History:  Diagnosis Date  . Allergic rhinitis, cause unspecified   . Arthritis   . Benign positional vertigo   . GERD   . HYPERLIPIDEMIA   . Hypertension   . HYPOTHYROIDISM dx 10/2010  . Low back pain    MRI 02/2011  . Migraines   . OSTEOPENIA   . Osteoporosis    Qualifier: Diagnosis of  By: Nena Jordan   . RENAL CALCULUS, RIGHT    L 01/2012    Patient Active Problem List   Diagnosis Date Noted  . URI (upper respiratory infection) 01/28/2021  . Degenerative arthritis of right knee 12/02/2020  . Routine general medical examination at a health care facility 12/08/2018  . Bilateral impacted cerumen 05/03/2018  . Presbycusis of both ears 05/03/2018  . Adjustment disorder 06/05/2015  . Primary localized osteoarthrosis, lower leg 01/09/2014  . Hypothyroidism 11/02/2010  . Osteoporosis   . Hyperlipidemia 12/23/2008  . GERD 05/21/2008    Past Surgical  History:  Procedure Laterality Date  . HAND SURGERY  2001   Dr Teressa Senter-- surgery of right hand to rebuild joint  . HERNIA REPAIR  03/2016  . Kidney stone removed  01/12/10  . TUBAL LIGATION  1979     OB History   No obstetric history on file.     Family History  Problem Relation Age of Onset  . Hyperlipidemia Mother   . Hypertension Mother   . Cancer Father        prostate. [name]  . Coronary artery disease Father   . Hypertension Father     Social History   Tobacco Use  . Smoking status: Never Smoker  . Smokeless tobacco: Never Used  Vaping Use  . Vaping Use: Never used  Substance Use Topics  . Alcohol use: Yes    Comment: occasional  . Drug use: No    Home Medications Prior to Admission medications   Medication Sig Start Date End Date Taking? Authorizing Provider  acetaminophen (TYLENOL) 325 MG tablet Take 650 mg by mouth See admin instructions. Take 650 mg by mouth at bedtime and an additional 650 mg every 6 hours as needed for pain   Yes [provider]  calcium carbonate (OS-CAL - DOSED IN MG OF ELEMENTAL CALCIUM) 1250 (500 Ca) MG tablet Take 1 tablet by mouth 2 (two) times daily with a meal.   Yes [provider]  Cholecalciferol (VITAMIN D-3) 1000  UNITS CAPS Take 1,000 Units by mouth in the morning and at bedtime.   Yes [provider]  ciprofloxacin (CIPRO) 500 MG tablet Take 1 tablet (500 mg total) by mouth every 12 (twelve) hours for 14 days. 02/25/21 03/11/21 Yes Alvira Monday, MD  diclofenac sodium (VOLTAREN) 1 % GEL Apply 2 g topically 4 (four) times daily. Patient taking differently: Apply 2 g topically 4 (four) times daily as needed (for back or knee pain). 12/07/18  Yes Myrlene Broker, MD  dicyclomine (BENTYL) 20 MG tablet Take 1 tablet (20 mg total) by mouth 2 (two) times daily as needed for spasms (abdominal paoin). 02/25/21  Yes Alvira Monday, MD  fish oil-omega-3 fatty acids 1000 MG capsule Take 1 g by mouth  every evening.   Yes [provider]  HYDROcodone-acetaminophen (NORCO/VICODIN) 5-325 MG tablet Take 1 tablet by mouth every 4 (four) hours as needed. 02/25/21  Yes Alvira Monday, MD  levothyroxine (SYNTHROID) 25 MCG tablet Take 1 tablet (25 mcg total) by mouth daily. 05/07/20  Yes Myrlene Broker, MD  metroNIDAZOLE (FLAGYL) 500 MG tablet Take 1 tablet (500 mg total) by mouth 3 (three) times daily for 14 days. 02/25/21 03/11/21 Yes Alvira Monday, MD  Multiple Vitamin (MULTIVITAMIN) tablet Take 1 tablet by mouth daily.   Yes [provider]  multivitamin-lutein (OCUVITE-LUTEIN) CAPS capsule Take 1 capsule by mouth daily.   Yes [provider]  omeprazole (PRILOSEC) 40 MG capsule Take 1 capsule (40 mg total) by mouth daily. 05/07/20  Yes Myrlene Broker, MD  ondansetron (ZOFRAN ODT) 4 MG disintegrating tablet Take 1 tablet (4 mg total) by mouth every 8 (eight) hours as needed for nausea or vomiting. 02/25/21  Yes Alvira Monday, MD  PARoxetine (PAXIL) 10 MG tablet Take 1 tablet (10 mg total) by mouth daily. 05/07/20  Yes Myrlene Broker, MD  simvastatin (ZOCOR) 20 MG tablet Take 1 tablet (20 mg total) by mouth daily at 6 PM. 05/07/20  Yes Myrlene Broker, MD    Allergies    Shellfish allergy and Sulfonamide derivatives  Review of Systems   Review of Systems  Constitutional: Negative for fever.  Respiratory: Negative for cough and shortness of breath.   Gastrointestinal: Positive for abdominal pain and nausea. Negative for constipation, diarrhea and vomiting.  Genitourinary: Negative for difficulty urinating, flank pain and hematuria.  Musculoskeletal: Negative for back pain.  Skin: Negative for rash.  Neurological: Negative for syncope and headaches.    Physical Exam Updated Vital Signs BP (!) 144/76 (BP Location: Left Arm)   Pulse 89   Temp 98.9 F (37.2 C) (Oral)   Resp 17   SpO2 99%   Physical Exam Vitals and nursing note reviewed.   Constitutional:      General: She is not in acute distress.    Appearance: Normal appearance. She is not ill-appearing, toxic-appearing or diaphoretic.  HENT:     Head: Normocephalic.  Eyes:     Conjunctiva/sclera: Conjunctivae normal.  Cardiovascular:     Rate and Rhythm: Normal rate and regular rhythm.     Pulses: Normal pulses.  Pulmonary:     Effort: Pulmonary effort is normal. No respiratory distress.  Abdominal:     Tenderness: There is abdominal tenderness (RLQ). There is no right CVA tenderness or left CVA tenderness.  Musculoskeletal:        General: No deformity or signs of injury.     Cervical back: No rigidity.  Skin:    General: Skin  is warm and dry.     Coloration: Skin is not jaundiced or pale.  Neurological:     General: No focal deficit present.     Mental Status: She is alert and oriented to person, place, and time.     ED Results / Procedures / Treatments   Labs (all labs ordered are listed, but only abnormal results are displayed) Labs Reviewed  COMPREHENSIVE METABOLIC PANEL - Abnormal; Notable for the following components:      Result Value   Glucose, Bld 107 (*)    Creatinine, Ser 1.06 (*)    GFR, Estimated 54 (*)    All other components within normal limits  CBC WITH DIFFERENTIAL/PLATELET - Abnormal; Notable for the following components:   WBC 12.8 (*)    Hemoglobin 11.5 (*)    Neutro Abs 8.0 (*)    All other components within normal limits  LIPASE, BLOOD  URINALYSIS, ROUTINE W REFLEX MICROSCOPIC    EKG None  Radiology CT ABDOMEN PELVIS W CONTRAST  Result Date: 02/25/2021 CLINICAL DATA:  Right lower quadrant pain EXAM: CT ABDOMEN AND PELVIS WITH CONTRAST TECHNIQUE: Multidetector CT imaging of the abdomen and pelvis was performed using the standard protocol following bolus administration of intravenous contrast. CONTRAST:  100mL OMNIPAQUE IOHEXOL 300 MG/ML  SOLN COMPARISON:  CT 03/05/2016 FINDINGS: Lower chest: Lung bases demonstrate no acute  consolidation or effusion. Moderate hiatal hernia. Borderline cardiomegaly. Hepatobiliary: Subcentimeter hypodensities in the liver too small to further characterize. Punctate layering stones in the gallbladder. No biliary dilatation. Pancreas: Unremarkable. No pancreatic ductal dilatation or surrounding inflammatory changes. Spleen: Normal in size without focal abnormality. Adrenals/Urinary Tract: Adrenal glands are normal. Kidneys show no hydronephrosis. Small parapelvic cysts. Subcentimeter hypodensities too small to further characterize. 5 mm stone lower pole right kidney. The urinary bladder is unremarkable Stomach/Bowel: The stomach is nonenlarged. There is no dilated small bowel. Extensive diverticular disease of the colon. Mild inflammatory changes involving the proximal descending colon consistent with acute diverticulitis. Negative appendix Vascular/Lymphatic: Moderate aortic atherosclerosis without aneurysm. No suspicious nodes. Reproductive: Uterus and bilateral adnexa are unremarkable. Other: Negative for free air or free fluid. Musculoskeletal: Degenerative changes of the spine. No acute or suspicious osseous abnormality IMPRESSION: 1. Findings consistent with acute diverticulitis involving the proximal descending colon. No perforation or abscess. 2. Gallstones. 3. Nonobstructing stone in the right kidney. 4. Moderate hiatal hernia. Aortic Atherosclerosis (ICD10-I70.0). Electronically Signed   By: Jasmine PangKim  Fujinaga M.D.   On: 02/25/2021 17:00    Procedures Procedures   Medications Ordered in ED Medications  sodium chloride 0.9 % bolus 1,000 mL (0 mLs Intravenous Stopped 02/25/21 1729)  iohexol (OMNIPAQUE) 300 MG/ML solution 100 mL (100 mLs Intravenous Contrast Given 02/25/21 1643)    ED Course  I have reviewed the triage vital signs and the nursing notes.  Pertinent labs & imaging results that were available during my care of the patient were reviewed by me and considered in my medical  decision making (see chart for details).    MDM Rules/Calculators/A&P                          75 year old female with history of hypertension, hyperlipidemia, migraines, hypothyroidism, presents with concern for right-sided abdominal pain.  DDx includes appendicitis, pancreatitis, cholecystitis, pyelonephritis, nephrolithiasis, diverticulitis, PID, ovarian torsion, AAA.  CT shows acute diverticuliits. Has gallstones but no sign of cholecystitis and does not have RUQ abdominal pain on my exam.  Nonobstructing right kidney stone  without hydronephrosis likely not etiology of pain.   Given rx for flagyl, cipro, reviewed in [place] database and gave short rx for norco. Patient discharged in stable condition with understanding of reasons to return.    Final Clinical Impression(s) / ED Diagnoses Final diagnoses:  Diverticulitis    Rx / DC Orders ED Discharge Orders         Ordered    metroNIDAZOLE (FLAGYL) 500 MG tablet  3 times daily        02/25/21 1906    ciprofloxacin (CIPRO) 500 MG tablet  Every 12 hours        02/25/21 1906    dicyclomine (BENTYL) 20 MG tablet  2 times daily PRN        02/25/21 1906    ondansetron (ZOFRAN ODT) 4 MG disintegrating tablet  Every 8 hours PRN        02/25/21 1906    HYDROcodone-acetaminophen (NORCO/VICODIN) 5-325 MG tablet  Every 4 hours PRN        02/25/21 1906           Alvira Monday, MD 02/26/21 [phone]

## 2021-02-25 NOTE — ED Notes (Signed)
Pt ambulated to the bathroom on her own power, gait even and steady 

## 2021-02-25 NOTE — ED Triage Notes (Signed)
Pt here from home with c/o rlq abd pain , some slight nausea but no vomiting over the last 24 hrs , pt still has her appendix

## 2021-03-09 ENCOUNTER — Ambulatory Visit (INDEPENDENT_AMBULATORY_CARE_PROVIDER_SITE_OTHER): Payer: Medicare HMO | Admitting: Internal Medicine

## 2021-03-09 ENCOUNTER — Other Ambulatory Visit: Payer: Self-pay

## 2021-03-09 ENCOUNTER — Encounter: Payer: Self-pay | Admitting: Internal Medicine

## 2021-03-09 DIAGNOSIS — E039 Hypothyroidism, unspecified: Secondary | ICD-10-CM | POA: Diagnosis not present

## 2021-03-09 DIAGNOSIS — K5792 Diverticulitis of intestine, part unspecified, without perforation or abscess without bleeding: Secondary | ICD-10-CM | POA: Diagnosis not present

## 2021-03-09 DIAGNOSIS — R197 Diarrhea, unspecified: Secondary | ICD-10-CM

## 2021-03-09 MED ORDER — PAROXETINE HCL 10 MG PO TABS: 10.0000 mg | ORAL_TABLET | Freq: Every day | ORAL | 3 refills | Status: DC

## 2021-03-09 MED ORDER — LEVOTHYROXINE SODIUM 25 MCG PO TABS: 25.0000 ug | ORAL_TABLET | Freq: Every day | ORAL | 3 refills | Status: DC

## 2021-03-09 MED ORDER — SIMVASTATIN 20 MG PO TABS: 20.0000 mg | ORAL_TABLET | Freq: Every day | ORAL | 3 refills | Status: DC

## 2021-03-09 NOTE — Progress Notes (Signed)
   Subjective:   Patient ID: Tiffany House, female    DOB: 17-Aug-1946, 75 y.o.   MRN: [id number]  HPI The patient is a 75 YO female coming in for ER follow up (in for abdominal pain severe, found to have diverticulitis, given pain medication and cipro/flagyl, pain was RLQ). She has taken about 10 days of medication. Pain is better. Having some new diarrhea since starting the medications as well as gas/bloated and metallic taste in mouth. Denies fevers or chills. Denies blood in stool. Overall has been eating bland soft diet. Overall improving some. Wondered if the antibiotics are causing the GI issues. Prior colonoscopy and most recent colon screening cologuard which was negative 12/20/2018. Has 1-2 days left of medication and will continue with this. Appetite is still poor and she is not eating well. Drinking plenty of fluids.   Review of Systems  Constitutional: Positive for activity change, appetite change and fatigue.  HENT: Negative.   Eyes: Negative.   Respiratory: Negative for cough, chest tightness and shortness of breath.   Cardiovascular: Negative for chest pain, palpitations and leg swelling.  Gastrointestinal: Positive for abdominal distention and diarrhea. Negative for abdominal pain, anal bleeding, blood in stool, constipation, nausea, rectal pain and vomiting.  Musculoskeletal: Negative.   Skin: Negative.   Neurological: Negative.   Psychiatric/Behavioral: Negative.     Objective:  Physical Exam Constitutional:      Appearance: She is well-developed.  HENT:     Head: Normocephalic and atraumatic.  Cardiovascular:     Rate and Rhythm: Normal rate and regular rhythm.  Pulmonary:     Effort: Pulmonary effort is normal. No respiratory distress.     Breath sounds: Normal breath sounds. No wheezing or rales.  Abdominal:     General: Bowel sounds are normal. There is no distension.     Palpations: Abdomen is soft.     Tenderness: There is abdominal tenderness. There is no  rebound.     Comments: Mild tenderness diffuse, no guarding or rebound. + BS  Musculoskeletal:     Cervical back: Normal range of motion.  Skin:    General: Skin is warm and dry.  Neurological:     Mental Status: She is alert and oriented to person, place, and time.     Coordination: Coordination normal.     Vitals:   03/09/21 1515  BP: 128/80  Pulse: 97  Resp: 18  Temp: 98.7 F (37.1 C)  TempSrc: Oral  SpO2: 97%  Weight: 141 lb (64 kg)  Height: 5' (1.524 m)    This visit occurred during the SARS-CoV-2 public health emergency.  Safety protocols were in place, including screening questions prior to the visit, additional usage of staff PPE, and extensive cleaning of exam room while observing appropriate contact time as indicated for disinfecting solutions.   Assessment & Plan:

## 2021-03-09 NOTE — Patient Instructions (Addendum)
Finish the medicine. There are no diet restrictions so to speak.  Add foods back as able.   Add a probiotic for the next couple of weeks.

## 2021-03-10 DIAGNOSIS — R197 Diarrhea, unspecified: Secondary | ICD-10-CM | POA: Insufficient documentation

## 2021-03-10 DIAGNOSIS — K5792 Diverticulitis of intestine, part unspecified, without perforation or abscess without bleeding: Secondary | ICD-10-CM | POA: Insufficient documentation

## 2021-03-10 NOTE — Assessment & Plan Note (Signed)
Likely due to medications and advised to start probiotic to help. If persistent after stopping medications she will let us know. Cipro does have risk for C dif.

## 2021-03-10 NOTE — Assessment & Plan Note (Signed)
Needs refill on synthroid and levels appropriate. Refilled.

## 2021-03-10 NOTE — Assessment & Plan Note (Signed)
Continue cipro/flagyl until gone. Does not need coloscopy at this time. She will if she has recurrent diverticulitis. Advised to start probiotic to help with GI symptoms.

## 2021-03-12 ENCOUNTER — Encounter (HOSPITAL_COMMUNITY): Payer: Self-pay

## 2021-03-12 ENCOUNTER — Emergency Department (HOSPITAL_COMMUNITY): Payer: Medicare HMO

## 2021-03-12 ENCOUNTER — Inpatient Hospital Stay (HOSPITAL_COMMUNITY)
Admission: EM | Admit: 2021-03-12 | Discharge: 2021-03-14 | DRG: 066 | Disposition: A | Discharge status: Home / Self Care | Payer: Medicare HMO | Attending: Family Medicine | Admitting: Family Medicine

## 2021-03-12 ENCOUNTER — Other Ambulatory Visit: Payer: Self-pay

## 2021-03-12 DIAGNOSIS — Z66 Do not resuscitate: Secondary | ICD-10-CM | POA: Diagnosis present

## 2021-03-12 DIAGNOSIS — E039 Hypothyroidism, unspecified: Secondary | ICD-10-CM | POA: Diagnosis not present

## 2021-03-12 DIAGNOSIS — F4320 Adjustment disorder, unspecified: Secondary | ICD-10-CM | POA: Diagnosis present

## 2021-03-12 DIAGNOSIS — Z79890 Hormone replacement therapy (postmenopausal): Secondary | ICD-10-CM

## 2021-03-12 DIAGNOSIS — R29818 Other symptoms and signs involving the nervous system: Secondary | ICD-10-CM | POA: Diagnosis not present

## 2021-03-12 DIAGNOSIS — Z8249 Family history of ischemic heart disease and other diseases of the circulatory system: Secondary | ICD-10-CM

## 2021-03-12 DIAGNOSIS — R29702 NIHSS score 2: Secondary | ICD-10-CM | POA: Diagnosis present

## 2021-03-12 DIAGNOSIS — R4701 Aphasia: Secondary | ICD-10-CM | POA: Diagnosis present

## 2021-03-12 DIAGNOSIS — I639 Cerebral infarction, unspecified: Secondary | ICD-10-CM

## 2021-03-12 DIAGNOSIS — Z91013 Allergy to seafood: Secondary | ICD-10-CM

## 2021-03-12 DIAGNOSIS — I10 Essential (primary) hypertension: Secondary | ICD-10-CM | POA: Diagnosis present

## 2021-03-12 DIAGNOSIS — Z79899 Other long term (current) drug therapy: Secondary | ICD-10-CM

## 2021-03-12 DIAGNOSIS — E782 Mixed hyperlipidemia: Secondary | ICD-10-CM

## 2021-03-12 DIAGNOSIS — I6340 Cerebral infarction due to embolism of unspecified cerebral artery: Secondary | ICD-10-CM

## 2021-03-12 DIAGNOSIS — Z20822 Contact with and (suspected) exposure to covid-19: Secondary | ICD-10-CM | POA: Diagnosis present

## 2021-03-12 DIAGNOSIS — I498 Other specified cardiac arrhythmias: Secondary | ICD-10-CM | POA: Diagnosis present

## 2021-03-12 DIAGNOSIS — E785 Hyperlipidemia, unspecified: Secondary | ICD-10-CM | POA: Diagnosis present

## 2021-03-12 DIAGNOSIS — R4781 Slurred speech: Secondary | ICD-10-CM | POA: Diagnosis not present

## 2021-03-12 DIAGNOSIS — R29810 Facial weakness: Secondary | ICD-10-CM | POA: Diagnosis not present

## 2021-03-12 DIAGNOSIS — J309 Allergic rhinitis, unspecified: Secondary | ICD-10-CM | POA: Diagnosis present

## 2021-03-12 DIAGNOSIS — G459 Transient cerebral ischemic attack, unspecified: Secondary | ICD-10-CM | POA: Diagnosis not present

## 2021-03-12 DIAGNOSIS — Z882 Allergy status to sulfonamides status: Secondary | ICD-10-CM

## 2021-03-12 DIAGNOSIS — H8110 Benign paroxysmal vertigo, unspecified ear: Secondary | ICD-10-CM | POA: Diagnosis present

## 2021-03-12 DIAGNOSIS — R471 Dysarthria and anarthria: Secondary | ICD-10-CM | POA: Diagnosis present

## 2021-03-12 DIAGNOSIS — K219 Gastro-esophageal reflux disease without esophagitis: Secondary | ICD-10-CM | POA: Diagnosis present

## 2021-03-12 DIAGNOSIS — G4489 Other headache syndrome: Secondary | ICD-10-CM | POA: Diagnosis not present

## 2021-03-12 DIAGNOSIS — I63412 Cerebral infarction due to embolism of left middle cerebral artery: Secondary | ICD-10-CM | POA: Diagnosis not present

## 2021-03-12 DIAGNOSIS — R4702 Dysphasia: Secondary | ICD-10-CM | POA: Diagnosis not present

## 2021-03-12 DIAGNOSIS — G43909 Migraine, unspecified, not intractable, without status migrainosus: Secondary | ICD-10-CM | POA: Diagnosis present

## 2021-03-12 DIAGNOSIS — Z83438 Family history of other disorder of lipoprotein metabolism and other lipidemia: Secondary | ICD-10-CM

## 2021-03-12 DIAGNOSIS — Z8673 Personal history of transient ischemic attack (TIA), and cerebral infarction without residual deficits: Secondary | ICD-10-CM

## 2021-03-12 LAB — COMPREHENSIVE METABOLIC PANEL
ALT: 17 U/L (ref 0–44)
AST: 27 U/L (ref 15–41)
Albumin: 3.6 g/dL (ref 3.5–5.0)
Alkaline Phosphatase: 47 U/L (ref 38–126)
Anion gap: 10 (ref 5–15)
BUN: 12 mg/dL (ref 8–23)
CO2: 19 mmol/L — ABNORMAL LOW (ref 22–32)
Calcium: 9.3 mg/dL (ref 8.9–10.3)
Chloride: 110 mmol/L (ref 98–111)
Creatinine, Ser: 1.13 mg/dL — ABNORMAL HIGH (ref 0.44–1.00)
GFR, Estimated: 50 mL/min — ABNORMAL LOW (ref >60)
Glucose, Bld: 81 mg/dL (ref 70–99)
Potassium: 3.4 mmol/L — ABNORMAL LOW (ref 3.5–5.1)
Sodium: 139 mmol/L (ref 135–145)
Total Bilirubin: 0.4 mg/dL (ref 0.3–1.2)
Total Protein: 6.2 g/dL — ABNORMAL LOW (ref 6.5–8.1)

## 2021-03-12 LAB — DIFFERENTIAL
Abs Immature Granulocytes: 0.02 10*3/uL (ref 0.00–0.07)
Basophils Absolute: 0.1 10*3/uL (ref 0.0–0.1)
Basophils Relative: 1 %
Eosinophils Absolute: 0.3 10*3/uL (ref 0.0–0.5)
Eosinophils Relative: 3 %
Immature Granulocytes: 0 %
Lymphocytes Relative: 38 %
Lymphs Abs: 3.4 10*3/uL (ref 0.7–4.0)
Monocytes Absolute: 0.9 10*3/uL (ref 0.1–1.0)
Monocytes Relative: 10 %
Neutro Abs: 4.3 10*3/uL (ref 1.7–7.7)
Neutrophils Relative %: 48 %

## 2021-03-12 LAB — URINALYSIS, ROUTINE W REFLEX MICROSCOPIC
Bilirubin Urine: NEGATIVE
Glucose, UA: NEGATIVE mg/dL
Hgb urine dipstick: NEGATIVE
Ketones, ur: NEGATIVE mg/dL
Nitrite: NEGATIVE
Protein, ur: NEGATIVE mg/dL
Specific Gravity, Urine: >1.046 — ABNORMAL HIGH (ref 1.005–1.030)
pH: 6 (ref 5.0–8.0)

## 2021-03-12 LAB — CBC
HCT: 34.6 % — ABNORMAL LOW (ref 36.0–46.0)
Hemoglobin: 10.8 g/dL — ABNORMAL LOW (ref 12.0–15.0)
MCH: 27.6 pg (ref 26.0–34.0)
MCHC: 31.2 g/dL (ref 30.0–36.0)
MCV: 88.3 fL (ref 80.0–100.0)
Platelets: 381 10*3/uL (ref 150–400)
RBC: 3.92 MIL/uL (ref 3.87–5.11)
RDW: 14.9 % (ref 11.5–15.5)
WBC: 9 10*3/uL (ref 4.0–10.5)
nRBC: 0 % (ref 0.0–0.2)

## 2021-03-12 LAB — RAPID URINE DRUG SCREEN, HOSP PERFORMED
Amphetamines: NOT DETECTED
Barbiturates: NOT DETECTED
Benzodiazepines: NOT DETECTED
Cocaine: NOT DETECTED
Opiates: NOT DETECTED
Tetrahydrocannabinol: NOT DETECTED

## 2021-03-12 LAB — I-STAT CHEM 8, ED
BUN: 12 mg/dL (ref 8–23)
Calcium, Ion: 1.13 mmol/L — ABNORMAL LOW (ref 1.15–1.40)
Chloride: 109 mmol/L (ref 98–111)
Creatinine, Ser: 1 mg/dL (ref 0.44–1.00)
Glucose, Bld: 82 mg/dL (ref 70–99)
HCT: 32 % — ABNORMAL LOW (ref 36.0–46.0)
Hemoglobin: 10.9 g/dL — ABNORMAL LOW (ref 12.0–15.0)
Potassium: 3.3 mmol/L — ABNORMAL LOW (ref 3.5–5.1)
Sodium: 142 mmol/L (ref 135–145)
TCO2: 21 mmol/L — ABNORMAL LOW (ref 22–32)

## 2021-03-12 LAB — HEMOGLOBIN A1C
Hgb A1c MFr Bld: 5.7 % — ABNORMAL HIGH (ref 4.8–5.6)
Mean Plasma Glucose: 116.89 mg/dL

## 2021-03-12 LAB — RESP PANEL BY RT-PCR (FLU A&B, COVID) ARPGX2
Influenza A by PCR: NEGATIVE
Influenza B by PCR: NEGATIVE
SARS Coronavirus 2 by RT PCR: NEGATIVE

## 2021-03-12 LAB — PROTIME-INR
INR: 1.2 (ref 0.8–1.2)
Prothrombin Time: 14.9 seconds (ref 11.4–15.2)

## 2021-03-12 LAB — LDL CHOLESTEROL, DIRECT: Direct LDL: 71.3 mg/dL (ref 0–99)

## 2021-03-12 LAB — APTT: aPTT: 22 seconds — ABNORMAL LOW (ref 24–36)

## 2021-03-12 LAB — ETHANOL: Alcohol, Ethyl (B): <10 mg/dL (ref <10)

## 2021-03-12 MED ORDER — ASPIRIN 81 MG PO CHEW
81.0000 mg | CHEWABLE_TABLET | Freq: Every day | ORAL | Status: DC
  Administered 2021-03-13 – 2021-03-14 (×2): 81 mg via ORAL
  Filled 2021-03-12 (×2): qty 1

## 2021-03-12 MED ORDER — ASPIRIN 325 MG PO TABS
325.0000 mg | ORAL_TABLET | Freq: Once | ORAL | Status: AC
  Administered 2021-03-12: 325 mg via ORAL
  Filled 2021-03-12: qty 1

## 2021-03-12 MED ORDER — IOHEXOL 350 MG/ML SOLN
100.0000 mL | Freq: Once | INTRAVENOUS | Status: AC | PRN
  Administered 2021-03-12: 100 mL via INTRAVENOUS

## 2021-03-12 NOTE — ED Notes (Signed)
Tiffany House [phone] brother pf pt would like a call with a update please.

## 2021-03-12 NOTE — H&P (Addendum)
[facility] History & Physical  Kelly Splinter MD  Referring Provider: Dr. Eula Listen Losano 03/12/2021  Chief Complaint: Aphasia HPI: The patient is a 75 y.o. year-old w/ hx of migraine HA, back pain, HTN, hypothyroidism, HL, GERD, BPV woke up today feeling fine but in mid afternoon about 2 pm she started feeling poorly per the ED notes. She took a nap but felt worse upon awakening and went to her neighbors who called 911. In ED her main issue was word finding difficulty. Was borderline outside the TPA window per ED MD.  Neurology consulted and recommendations made, see below. Head CT showed no acute findings and CTA of head/ neck showed no LVO.  Plan was made for admit for full CVA workup.  Asked to see for admission.    Pt seen in ED.  She is still having mild aphasia.  No R arm c/o's.    Pt is a widow since around Jan 31, 2006, husband died of cancer. She got a new house a few yrs ago and lives alone.   No hx HTN, heart disease or cancer.      ROS  denies CP  no joint pain   no HA  no blurry vision  no rash  no diarrhea  no nausea/ vomiting  no dysuria  no difficulty voiding  no change in urine color   Past Medical History  Past Medical History:  Diagnosis Date  . Allergic rhinitis, cause unspecified   . Arthritis   . Benign positional vertigo   . GERD   . HYPERLIPIDEMIA   . Hypertension   . HYPOTHYROIDISM dx 10/2010  . Low back pain    MRI 02/2011  . Migraines   . OSTEOPENIA   . Osteoporosis    Qualifier: Diagnosis of  By: Wynona Luna   . RENAL CALCULUS, RIGHT    L 02-01-12   Past Surgical History  Past Surgical History:  Procedure Laterality Date  . HAND SURGERY  02-01-2000   Dr Daylene Katayama-- surgery of right hand to rebuild joint  . HERNIA REPAIR  03/2016  . Kidney stone removed  01/12/10  . TUBAL LIGATION  1979   Family History  Family History  Problem Relation Age of Onset  . Hyperlipidemia Mother   . Hypertension Mother   . Cancer Father         prostate. Suzie Portela  . Coronary artery disease Father   . Hypertension Father    Social History  reports that she has never smoked. She has never used smokeless tobacco. She reports current alcohol use. She reports that she does not use drugs. Allergies  Allergies  Allergen Reactions  . Shellfish Allergy Nausea And Vomiting    Scallops and oysters  . Sulfonamide Derivatives Other (See Comments)    Reaction not recalled   Home medications Prior to Admission medications   Medication Sig Start Date End Date Taking? Authorizing Provider  acetaminophen (TYLENOL) 325 MG tablet Take 650 mg by mouth See admin instructions. Take 650 mg by mouth at bedtime and an additional 650 mg every 6 hours as needed for pain    [provider]  calcium carbonate (OS-CAL - DOSED IN MG OF ELEMENTAL CALCIUM) 1250 (500 Ca) MG tablet Take 1 tablet by mouth 2 (two) times daily with a meal.    [provider]  Cholecalciferol (VITAMIN D-3) 1000 UNITS CAPS Take 1,000 Units by mouth in the morning and at bedtime.  [provider]  diclofenac sodium (VOLTAREN) 1 % GEL Apply 2 g topically 4 (four) times daily. Patient taking differently: Apply 2 g topically 4 (four) times daily as needed (for back or knee pain). 12/07/18   Hoyt Koch, MD  dicyclomine (BENTYL) 20 MG tablet Take 1 tablet (20 mg total) by mouth 2 (two) times daily as needed for spasms (abdominal paoin). 02/25/21   Gareth Morgan, MD  fish oil-omega-3 fatty acids 1000 MG capsule Take 1 g by mouth every evening.    [provider]  HYDROcodone-acetaminophen (NORCO/VICODIN) 5-325 MG tablet Take 1 tablet by mouth every 4 (four) hours as needed. Patient not taking: Reported on 03/09/2021 02/25/21   Gareth Morgan, MD  levothyroxine (SYNTHROID) 25 MCG tablet Take 1 tablet (25 mcg total) by mouth daily. 03/09/21   Hoyt Koch, MD  Multiple Vitamin (MULTIVITAMIN) tablet Take 1 tablet by mouth daily.     [provider]  multivitamin-lutein (OCUVITE-LUTEIN) CAPS capsule Take 1 capsule by mouth daily.    [provider]  omeprazole (PRILOSEC) 40 MG capsule Take 1 capsule (40 mg total) by mouth daily. 05/07/20   Hoyt Koch, MD  ondansetron (ZOFRAN ODT) 4 MG disintegrating tablet Take 1 tablet (4 mg total) by mouth every 8 (eight) hours as needed for nausea or vomiting. 02/25/21   Gareth Morgan, MD  PARoxetine (PAXIL) 10 MG tablet Take 1 tablet (10 mg total) by mouth daily. 03/09/21   Hoyt Koch, MD  simvastatin (ZOCOR) 20 MG tablet Take 1 tablet (20 mg total) by mouth daily at 6 PM. 03/09/21   Hoyt Koch, MD       Exam Gen alert, no distress, elderly WF in no distress Mild aphasia / word-finding issues  No rash, cyanosis or gangrene Sclera anicteric, throat clear  No jvd or bruits Chest clear bilat to bases, no rales/ wheezing RRR no MRG Abd soft ntnd no mass or ascites +bs GU normal MS no joint effusions or deformity Ext no LE or UE edema, no wounds or ulcers Neuro is alert, Ox 3 , nf      Home meds:  - synthroid 25 ug qd/ prilosec 40 qd  - norco 1 qid prn/ mobic 7.5 1-2 qd prn/ voltaren gel qid prn/ bentyl 20 bid prn/  paxil 10 qd  - zocor 20 qd  - prn's/ vitamins/ supplements     Na 139 K 3.4  CO2 19  BUN 12  Cr 1.13  eGFR 50 ml/min  CA 9.3  Alb 3.6  LFT's wnl  wBC 9K  HB 10.9   A1C 5.7  Glu 81     COVID neg/ flu A+B neg    UA  Clear , > 1.046, rare bact, 0-5 rbc/ 6-10wbc    UDS neg, etoh <10    CTA head / neck - IMPRESSION: 1. No emergent large vessel occlusion or high-grade stenosis of the intracranial or cervical arteries. 2. Debris in the esophagus, suggesting gastroesophageal reflux. Aortic Atherosclerosis (ICD10-I70.0).    CT Head WO contrast - IMPRESSION: Negative CT head    VS  BP 170/87 > 156/60  HR 90  RR 12- 26   Temp 97.6   RA 96- 98%    Assessment/ Plan: 1. Acute CVA - w/ aphasia.  Seen by neurology.  Outside  of TPA window.  CTA head negative, and CTA of head+ neck was w/o LVO.  Other rec's per neurology are for permissive HTN for 48hrs, MRI  brain, TTE w/ bubble, ASA 325 then 81 qd , cont statin. See admit orders.  2. Hypothyroidism - cont synthroid 3. Hyperlipidemia - statin 4. GERD - PPI      Kelly Splinter  MD 03/12/2021, 9:03 PM         Called neuro, borderline window, +/- LVO , Dr Quinn Axe. Stroke w/u.  Aphasia

## 2021-03-12 NOTE — ED Triage Notes (Signed)
Pt came via EMS. Pts neighbor called 911 bc pt became aphasic. Pt is able to walk. Axox4. VS stable. Pt has hx of hypertension

## 2021-03-12 NOTE — ED Provider Notes (Signed)
[facility] Provider Note   CSN: [id number] Arrival date & time: 03/12/21  1828     History Chief Complaint  Patient presents with  . Aphasia    [facility] is a 75 y.o. female.  75 year old female with past medical history of hyperlipidemia brought in by EMS for aphasia.  Patient states that she woke up this morning feeling fine, was out in her garden today when she had a sudden severe headache to the top of her head and has had problems with finding her words ever since.  Patient laid down to take a nap hoping to wake up feeling better. Woke up not feeling better and went to her neighbor who called 911. Symptoms possibly started around 2PM.  Denies weakness or numbness.  Patient is clearly frustrated with her difficulties communicating.  Patient is unable to give her medication list, states that she does take medications, does not take blood thinners.  No falls or injuries.        Past Medical History:  Diagnosis Date  . Allergic rhinitis, cause unspecified   . Arthritis   . Benign positional vertigo   . GERD   . HYPERLIPIDEMIA   . Hypertension   . HYPOTHYROIDISM dx 10/2010  . Low back pain    MRI 02/2011  . Migraines   . OSTEOPENIA   . Osteoporosis    Qualifier: Diagnosis of  By: Nena Jordan   . RENAL CALCULUS, RIGHT    L 01/2012    Patient Active Problem List   Diagnosis Date Noted  . Diverticulitis 03/10/2021  . Diarrhea 03/10/2021  . Degenerative arthritis of right knee 12/02/2020  . Routine general medical examination at a health care facility 12/08/2018  . Bilateral impacted cerumen 05/03/2018  . Presbycusis of both ears 05/03/2018  . Adjustment disorder 06/05/2015  . Primary localized osteoarthrosis, lower leg 01/09/2014  . Hypothyroidism 11/02/2010  . Osteoporosis   . Hyperlipidemia 12/23/2008  . GERD 05/21/2008    Past Surgical History:  Procedure Laterality Date  . HAND SURGERY  2001   Dr Teressa Senter--  surgery of right hand to rebuild joint  . HERNIA REPAIR  03/2016  . Kidney stone removed  01/12/10  . TUBAL LIGATION  1979     OB History   No obstetric history on file.     Family History  Problem Relation Age of Onset  . Hyperlipidemia Mother   . Hypertension Mother   . Cancer Father        prostate. [facility]  . Coronary artery disease Father   . Hypertension Father     Social History   Tobacco Use  . Smoking status: Never Smoker  . Smokeless tobacco: Never Used  Vaping Use  . Vaping Use: Never used  Substance Use Topics  . Alcohol use: Yes    Comment: occasional  . Drug use: No    Home Medications Prior to Admission medications   Medication Sig Start Date End Date Taking? Authorizing Provider  acetaminophen (TYLENOL) 325 MG tablet Take 650 mg by mouth See admin instructions. Take 650 mg by mouth at bedtime and an additional 650 mg every 6 hours as needed for pain    [provider]  calcium carbonate (OS-CAL - DOSED IN MG OF ELEMENTAL CALCIUM) 1250 (500 Ca) MG tablet Take 1 tablet by mouth 2 (two) times daily with a meal.    [provider]  Cholecalciferol (VITAMIN D-3) 1000 UNITS CAPS  Take 1,000 Units by mouth in the morning and at bedtime.    [provider]  diclofenac sodium (VOLTAREN) 1 % GEL Apply 2 g topically 4 (four) times daily. Patient taking differently: Apply 2 g topically 4 (four) times daily as needed (for back or knee pain). 12/07/18   Myrlene Broker, MD  dicyclomine (BENTYL) 20 MG tablet Take 1 tablet (20 mg total) by mouth 2 (two) times daily as needed for spasms (abdominal paoin). 02/25/21   Alvira Monday, MD  fish oil-omega-3 fatty acids 1000 MG capsule Take 1 g by mouth every evening.    [provider]  HYDROcodone-acetaminophen (NORCO/VICODIN) 5-325 MG tablet Take 1 tablet by mouth every 4 (four) hours as needed. Patient not taking: Reported on 03/09/2021 02/25/21   Alvira Monday, MD   levothyroxine (SYNTHROID) 25 MCG tablet Take 1 tablet (25 mcg total) by mouth daily. 03/09/21   Myrlene Broker, MD  Multiple Vitamin (MULTIVITAMIN) tablet Take 1 tablet by mouth daily.    [provider]  multivitamin-lutein (OCUVITE-LUTEIN) CAPS capsule Take 1 capsule by mouth daily.    [provider]  omeprazole (PRILOSEC) 40 MG capsule Take 1 capsule (40 mg total) by mouth daily. 05/07/20   Myrlene Broker, MD  ondansetron (ZOFRAN ODT) 4 MG disintegrating tablet Take 1 tablet (4 mg total) by mouth every 8 (eight) hours as needed for nausea or vomiting. 02/25/21   Alvira Monday, MD  PARoxetine (PAXIL) 10 MG tablet Take 1 tablet (10 mg total) by mouth daily. 03/09/21   Myrlene Broker, MD  simvastatin (ZOCOR) 20 MG tablet Take 1 tablet (20 mg total) by mouth daily at 6 PM. 03/09/21   Myrlene Broker, MD    Allergies    Shellfish allergy and Sulfonamide derivatives  Review of Systems   Review of Systems  Unable to perform ROS: Other (Difficulty communicating secondary to aphasia)  Eyes: Negative for visual disturbance.  Respiratory: Negative for shortness of breath.   Cardiovascular: Negative for chest pain.  Gastrointestinal: Negative for abdominal pain, nausea and vomiting.  Musculoskeletal: Negative for neck pain and neck stiffness.  Neurological: Positive for speech difficulty and headaches. Negative for dizziness, weakness and numbness.  Psychiatric/Behavioral: Negative for confusion.    Physical Exam Updated Vital Signs There were no vitals taken for this visit.  Physical Exam Vitals and nursing note reviewed.  Constitutional:      General: She is not in acute distress.    Appearance: She is well-developed. She is not diaphoretic.  HENT:     Head: Normocephalic and atraumatic.     Mouth/Throat:     Mouth: Mucous membranes are moist.  Eyes:     Extraocular Movements: Extraocular movements intact.     Conjunctiva/sclera:  Conjunctivae normal.     Pupils: Pupils are equal, round, and reactive to light.  Cardiovascular:     Rate and Rhythm: Normal rate and regular rhythm.     Pulses: Normal pulses.     Heart sounds: Normal heart sounds.  Pulmonary:     Effort: Pulmonary effort is normal.     Breath sounds: Normal breath sounds.  Abdominal:     Palpations: Abdomen is soft.     Tenderness: There is no abdominal tenderness.  Musculoskeletal:     Cervical back: Neck supple.     Right lower leg: No edema.     Left lower leg: No edema.  Skin:    General: Skin is warm and dry.  Findings: No erythema or rash.  Neurological:     Mental Status: She is alert.     Cranial Nerves: No facial asymmetry.     Sensory: No sensory deficit.     Motor: No weakness, tremor or pronator drift.     Coordination: Coordination normal. Heel to Shin Test normal.     Comments: Patient with difficulty with peripheral vision assessment  Psychiatric:        Behavior: Behavior normal.     ED Results / Procedures / Treatments   Labs (all labs ordered are listed, but only abnormal results are displayed) Labs Reviewed  RESP PANEL BY RT-PCR (FLU A&B, COVID) ARPGX2  ETHANOL  PROTIME-INR  APTT  CBC  DIFFERENTIAL  COMPREHENSIVE METABOLIC PANEL  RAPID URINE DRUG SCREEN, HOSP PERFORMED  URINALYSIS, ROUTINE W REFLEX MICROSCOPIC  I-STAT CHEM 8, ED    EKG EKG Interpretation  Date/Time:  Thursday March 12 2021 18:37:43 EDT Ventricular Rate:  75 PR Interval:  178 QRS Duration: 97 QT Interval:  419 QTC Calculation: 468 R Axis:   16 Text Interpretation: Sinus rhythm Abnormal R-wave progression, early transition No significant change since last tracing Confirmed by Linwood Dibbles [phone]) on 03/12/2021 6:51:35 PM   Radiology No results found.  Procedures Procedures   Medications Ordered in ED Medications - No data to display  ED Course  I have reviewed the triage vital signs and the nursing notes.  Pertinent labs &  imaging results that were available during my care of the patient were reviewed by me and considered in my medical decision making (see chart for details).  Clinical Course as of 03/12/21 1856  Thu Mar 12, 2021  2364 75 year old female brought in by EMS from home with word finding difficulty. Reports waking this morning well, at unknown time, possibly around 2 PM but was working in her garden when she had sudden severe headache the top of her head followed by word finding difficulty.  Patient laid down took a nap hoping to wake up feeling better.  When symptoms had not resolved upon waking, she went for neighbor's house and 911 was called. On exam, has difficulty with word finding and is easily frustrated by this.  Difficulty with peripheral vision assessment, no arm drift, sensation intact. Discussed with Dr. Lynelle Doctor, irritating who has seen the patient.  Neuro hospitalist paged for consult, CT head and labs ordered for further evaluation. [LM]  1856 Care signed out to Dr. Lynelle Doctor pending imaging, labs and neuro evaluation. [LM]    Clinical Course User Index [LM] Alden Hipp   MDM Rules/Calculators/A&P                          Final Clinical Impression(s) / ED Diagnoses Final diagnoses:  None    Rx / DC Orders ED Discharge Orders    None       Alden Hipp 03/12/21 1856    Linwood Dibbles, MD 03/12/21 [phone]

## 2021-03-12 NOTE — ED Provider Notes (Signed)
Pt presents with acute aphasia.  Pt knows that she woke up normally.  She is not sure of the exact time she started feeling poorly but she thinks it was around 2pm.  Definitely in the afternoon.  She went to take a nap thinking that she might feeling better.  When she woke up it was worse so she went to her neighbors who then called 911.  Patient is having word finding difficulty.  Is hard to get an exact time, but it seems that she might be outside any acute TPA window.  We will plan on stroke work-up consult with neurology now to discuss the case and make sure we should not activate any acute stroke.  Head CT without acute findings.  No large occlusion noted on CT angio.  Plan on admission for stroke workup.  Seen in the ED by Dr Selina Cooley, neruology.   Linwood Dibbles, MD 03/12/21 2036

## 2021-03-12 NOTE — ED Notes (Signed)
Pt Brother is named Production designer, theatre/television/film, [phone]

## 2021-03-12 NOTE — Consult Note (Signed)
NEUROLOGY CONSULTATION NOTE   Date of service: March 12, 2021 Patient Name: [facility] MRN:  [id number] DOB:  1945/12/27 Reason for consult: acute onset aphasia 1400 today _ _ _   _ __   _ __ _ _  __ __   _ __   __ _  History of Present Illness   [facility] is a 75 y.o. female with PMH significant for  has a past medical history of Allergic rhinitis, cause unspecified, Arthritis, Benign positional vertigo, GERD, HYPERLIPIDEMIA, Hypertension, HYPOTHYROIDISM (dx 10/2010), Low back pain, Migraines, OSTEOPENIA, Osteoporosis, and RENAL CALCULUS, RIGHT. who presents with acute onset aphasia starting at 1400 today.   Patient reports that around that time she began to not feel well and specifically had a headache.  She took a nap and the headache went away however she noticed that she was slightly confused and fumbling her words.  She went to a neighbor's house who recommended that she come to the emergency department.  She is currently having mild expressive aphasia and is understandably frustrated by this.  She had some very mild weakness in her right tricep and right grip on my exam however she had not noticed prior to this.  Head CT showed no acute process and no blood.  CTA head and neck so showed no LVO and no high-grade stenosis.  She does not take any antiplatelet as an outpatient and is not on anticoagulation.   ROS   10 point review of systems was performed and was negative except as described in HPI.  Past History   Past Medical History:  Diagnosis Date  . Allergic rhinitis, cause unspecified   . Arthritis   . Benign positional vertigo   . GERD   . HYPERLIPIDEMIA   . Hypertension   . HYPOTHYROIDISM dx 10/2010  . Low back pain    MRI 02/2011  . Migraines   . OSTEOPENIA   . Osteoporosis    Qualifier: Diagnosis of  By: Nena Jordan   . RENAL CALCULUS, RIGHT    L 01/2012   Past Surgical History:  Procedure Laterality Date  . HAND SURGERY  2001   Dr Teressa Senter-- surgery of  right hand to rebuild joint  . HERNIA REPAIR  03/2016  . Kidney stone removed  01/12/10  . TUBAL LIGATION  1979   Family History  Problem Relation Age of Onset  . Hyperlipidemia Mother   . Hypertension Mother   . Cancer Father        prostate. [facility]  . Coronary artery disease Father   . Hypertension Father    Social History   Socioeconomic History  . Marital status: Widowed    Spouse name: Not on file  . Number of children: 0  . Years of education: Not on file  . Highest education level: Not on file  Occupational History  . 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 Smoking status: Never Smoker  . Smokeless tobacco: Never Used  Vaping Use  . Vaping Use: Never used  Substance and Sexual Activity  . Alcohol use: Yes    Comment: occasional  . Drug use: No  . Sexual activity: Never  Other Topics Concern  . Not on file  Social History Narrative  . Not on file   Social Determinants of Health   Financial Resource Strain: Not on file  Food Insecurity: Not on file  Transportation Needs: Not on file  Physical  Activity: Not on file  Stress: Not on file  Social Connections: Not on file   Allergies  Allergen Reactions  . Shellfish Allergy Nausea And Vomiting    Scallops and oysters  . Sulfonamide Derivatives Other (See Comments)    Reaction not recalled    Medications   (Not in a hospital admission)    Vitals   Vitals:   03/12/21 1856 03/12/21 1858 03/12/21 1900 03/12/21 2045  BP: (!) 164/91  (!) 170/87 (!) 188/92  Pulse: 81  78 (!) 106  Resp: 19  14 (!) 21  Temp: 97.6 F (36.4 C)     TempSrc: Oral     SpO2: 99%  99% 97%  Weight:  64 kg    Height:  5' (1.524 m)       Body mass index is 27.56 kg/m.  Physical Exam   Physical Exam Gen: A&O x4, NAD HEENT: Atraumatic, normocephalic;mucous membranes moist; oropharynx clear, tongue without atrophy or fasciculations. Neck: Supple, trachea midline. Resp: CTAB, no w/r/r CV:  RRR, no m/g/r; nml S1 and S2. 2+ symmetric peripheral pulses. Abd: soft/NT/ND; nabs x 4 quad Extrem: Nml bulk; no cyanosis, clubbing, or edema.  Neuro: *MS: A&O x4. Follows multi-step commands with only occasional confusion. *Speech: fluid, mild dysarthria. Mild expressive aphasia with impaired naming and repetition. *CN:    I: Deferred   II,III: PERRLA, VFF by confrontation, UTA optic discs 2/2 pupillary constriction   III,IV,VI: EOMI w/o nystagmus, no ptosis   V: Sensation intact from V1 to V3 to LT   VII: Eyelid closure was full.  Smile symmetric.   VIII: Hearing intact to voice   IX,X: Voice normal, palate elevates symmetrically    XI: SCM/trap 5/5 bilat   XII: Tongue protrudes midline, no atrophy or fasciculations   *Motor:   Normal bulk.  No tremor, rigidity or bradykinesia. No pronator drift.    Strength: Dlt Bic Tri WrE WrF FgS Gr HF KnF KnE PlF DoF    Left 5 5 5 5 5 5 5 5 5 5 5 5     Right 5 5 4 5 5 5 4 5 5 5 5 5     *Sensory: Intact to light touch, pinprick, temperature vibration throughout. Symmetric. Propioception intact bilat.  No double-simultaneous extinction.  *Coordination:  Finger-to-nose, heel-to-shin, rapid alternating motions were intact. *Reflexes:  2+ and symmetric throughout without clonus; toes down-going bilat *Gait: deferred  NIHSS = 2 (dysarthria, aphasia)  Labs   CBC:  Recent Labs  Lab 03/12/21 1900 03/12/21 1921  WBC 9.0  --   NEUTROABS 4.3  --   HGB 10.8* 10.9*  HCT 34.6* 32.0*  MCV 88.3  --   PLT 381  --     Basic Metabolic Panel:  Lab Results  Component Value Date   NA 142 03/12/2021   K 3.3 (L) 03/12/2021   CO2 19 (L) 03/12/2021   GLUCOSE 82 03/12/2021   BUN 12 03/12/2021   CREATININE 1.00 03/12/2021   CALCIUM 9.3 03/12/2021   GFRNONAA 50 (L) 03/12/2021   GFRAA 93 01/27/2009   Lipid Panel:  Lab Results  Component Value Date   LDLCALC 108 (H) 05/07/2020   HgbA1c:  Lab Results  Component Value Date   HGBA1C 5.3  08/27/2015   Urine Drug Screen: No results found for: LABOPIA, COCAINSCRNUR, LABBENZ, AMPHETMU, THCU, LABBARB  Alcohol Level     Component Value Date/Time   [facility] <10 03/12/2021 1900     Impression   [facility]  is a 75 y.o. female with hx HTN, HL, BPPV who presents with acute onset aphasia starting at 1400 today. Suspect small ischemic infarct based on exam. Outside window for tPA. No indication for acute intervention given no LVO  NIHSS = 2 (dysarthria, aphasia)  Recommendations   - Admit to hospitalist service for stroke w/u; stroke team will consult - Permissive HTN x48 hrs from sx onset or until stroke ruled out by MRI goal BP <220/110. PRN labetalol or hydralazine if BP above these parameters. Avoid oral antihypertensives. - MRI brain wo contrast - TTE w/ bubble - Continue simvastatin 20mg  daily, escalate if indicated based on LDL (pending) - Given ASA in ED to be f/b 81mg  daily - SLP - Pt passed bedside swallow in ED - q4 hr neuro checks - STAT head CT for any change in neuro exam - Tele - Stroke education - Amb referral to neurology upon discharge  Stroke team will continue to follow.  ______________________________________________________________________   Thank you for the opportunity to take part in the care of this patient. If you have any further questions, please contact the neurology consultation attending.  Signed,  694, MD [facility] [phone]  If 7pm- 7am, please page neurology on call as listed in AMION.

## 2021-03-12 NOTE — ED Notes (Signed)
Patient transported to MRI 

## 2021-03-13 ENCOUNTER — Observation Stay (HOSPITAL_COMMUNITY): Payer: Medicare HMO

## 2021-03-13 ENCOUNTER — Observation Stay (HOSPITAL_BASED_OUTPATIENT_CLINIC_OR_DEPARTMENT_OTHER): Payer: Medicare HMO

## 2021-03-13 DIAGNOSIS — E039 Hypothyroidism, unspecified: Secondary | ICD-10-CM | POA: Diagnosis not present

## 2021-03-13 DIAGNOSIS — E782 Mixed hyperlipidemia: Secondary | ICD-10-CM | POA: Diagnosis not present

## 2021-03-13 DIAGNOSIS — R4701 Aphasia: Secondary | ICD-10-CM | POA: Diagnosis not present

## 2021-03-13 DIAGNOSIS — I63112 Cerebral infarction due to embolism of left vertebral artery: Secondary | ICD-10-CM | POA: Diagnosis not present

## 2021-03-13 DIAGNOSIS — I63412 Cerebral infarction due to embolism of left middle cerebral artery: Secondary | ICD-10-CM | POA: Diagnosis not present

## 2021-03-13 DIAGNOSIS — I639 Cerebral infarction, unspecified: Secondary | ICD-10-CM | POA: Diagnosis not present

## 2021-03-13 MED ORDER — CLOPIDOGREL BISULFATE 75 MG PO TABS
75.0000 mg | ORAL_TABLET | Freq: Every day | ORAL | Status: DC
  Administered 2021-03-13 – 2021-03-14 (×2): 75 mg via ORAL
  Filled 2021-03-13 (×2): qty 1

## 2021-03-13 MED ORDER — ACETAMINOPHEN 325 MG PO TABS
650.0000 mg | ORAL_TABLET | ORAL | Status: DC | PRN
  Administered 2021-03-13 – 2021-03-14 (×3): 650 mg via ORAL
  Filled 2021-03-13 (×3): qty 2

## 2021-03-13 MED ORDER — SIMVASTATIN 20 MG PO TABS: 20.0000 mg | ORAL_TABLET | Freq: Every day | ORAL | Status: DC

## 2021-03-13 MED ORDER — STROKE: EARLY STAGES OF RECOVERY BOOK
Freq: Once | Status: AC
  Filled 2021-03-13: qty 1

## 2021-03-13 MED ORDER — ADULT MULTIVITAMIN W/MINERALS CH
1.0000 | ORAL_TABLET | Freq: Every day | ORAL | Status: DC
  Administered 2021-03-13 – 2021-03-14 (×2): 1 via ORAL
  Filled 2021-03-13 (×2): qty 1

## 2021-03-13 MED ORDER — PAROXETINE HCL 10 MG PO TABS
10.0000 mg | ORAL_TABLET | Freq: Every day | ORAL | Status: DC
  Administered 2021-03-14: 10 mg via ORAL
  Filled 2021-03-13 (×2): qty 1

## 2021-03-13 MED ORDER — ENOXAPARIN SODIUM 40 MG/0.4ML ~~LOC~~ SOLN
40.0000 mg | SUBCUTANEOUS | Status: DC
  Administered 2021-03-13 – 2021-03-14 (×2): 40 mg via SUBCUTANEOUS
  Filled 2021-03-13 (×3): qty 0.4

## 2021-03-13 MED ORDER — PANTOPRAZOLE SODIUM 40 MG PO TBEC
40.0000 mg | DELAYED_RELEASE_TABLET | Freq: Every day | ORAL | Status: DC
  Administered 2021-03-13 – 2021-03-14 (×2): 40 mg via ORAL
  Filled 2021-03-13 (×2): qty 1

## 2021-03-13 MED ORDER — PROSIGHT PO TABS
1.0000 | ORAL_TABLET | Freq: Every day | ORAL | Status: DC
  Administered 2021-03-13 – 2021-03-14 (×2): 1 via ORAL
  Filled 2021-03-13 (×2): qty 1

## 2021-03-13 MED ORDER — ACETAMINOPHEN 650 MG RE SUPP: 650.0000 mg | RECTAL | Status: DC | PRN

## 2021-03-13 MED ORDER — SODIUM CHLORIDE 0.9 % IV SOLN: INTRAVENOUS | Status: AC

## 2021-03-13 MED ORDER — CALCIUM CARBONATE 1250 (500 CA) MG PO TABS
1.0000 | ORAL_TABLET | Freq: Two times a day (BID) | ORAL | Status: DC
  Administered 2021-03-13 – 2021-03-14 (×4): 500 mg via ORAL
  Filled 2021-03-13 (×4): qty 1

## 2021-03-13 MED ORDER — SENNOSIDES-DOCUSATE SODIUM 8.6-50 MG PO TABS: 1.0000 | ORAL_TABLET | Freq: Every evening | ORAL | Status: DC | PRN

## 2021-03-13 MED ORDER — DICLOFENAC SODIUM 1 % TD GEL: 2.0000 g | Freq: Four times a day (QID) | TRANSDERMAL | Status: DC | PRN

## 2021-03-13 MED ORDER — LEVOTHYROXINE SODIUM 25 MCG PO TABS
25.0000 ug | ORAL_TABLET | Freq: Every day | ORAL | Status: DC
  Administered 2021-03-13 – 2021-03-14 (×2): 25 ug via ORAL
  Filled 2021-03-13 (×2): qty 1

## 2021-03-13 MED ORDER — SIMVASTATIN 20 MG PO TABS
40.0000 mg | ORAL_TABLET | Freq: Every day | ORAL | Status: DC
  Administered 2021-03-13 – 2021-03-14 (×2): 40 mg via ORAL
  Filled 2021-03-13 (×2): qty 2

## 2021-03-13 MED ORDER — ACETAMINOPHEN 160 MG/5ML PO SOLN: 650.0000 mg | ORAL | Status: DC | PRN

## 2021-03-13 NOTE — Progress Notes (Signed)
Patient ID: Tiffany House, female   DOB: 12/16/1945, 75 y.o.   MRN: [id number]  PROGRESS NOTE    Tiffany House  ZTI:[id number] DOB: 02/28/1946 DOA: 03/12/2021 PCP: Tiffany House, Tiffany House    Brief Narrative:  The patient is a 75 y.o. year-old w/ hx of migraine HA, back pain, HTN, hypothyroidism, HL, GERD, BPV woke up today feeling fine but in mid afternoon about 2 pm she started feeling poorly per the ED notes. She took a nap but felt worse upon awakening and went to her neighbors who called 911. In ED her main issue was word finding difficulty. Was borderline outside the TPA window per ED Tiffany House.  Neurology consulted and. Head CT showed no acute findings and CTA of head/ neck showed no LVO.     Assessment & Plan:   Active Problems:   Hypothyroidism   Hyperlipidemia   Aphasia   Acute cerebrovascular accident (CVA) (HCC)  Acute CVA - w/ aphasia.  Seen by neurology.  Outside of TPA window.  CTA head negative, and CTA of head+ neck was w/o LVO.  Other rec's per neurology are for  Permissive HTN for 48hrs MRI brain revealed acute ischemia left parietal lobe TTE w/ bubble pending Lower extremity Dopplers are negative ASA 325 x1, then 81 qd  Plavix 75 mg x 3 weeks Continue statin, LDL 71 Hemoglobin A1c is 5.7, less than 7.0 goal  Hypothyroidism - cont synthroid  Hyperlipidemia - statin  GERD - PPI    DVT prophylaxis: Lovenox SQ Code Status: Full code  Family Communication: Patient at bedside Disposition Plan: Home after completing work-up. Patient remains inpatient due to ongoing work-up.   Consultants:   Neurology  Procedures:  Lower extremity Dopplers are normal  Echo pending  Antimicrobials: Anti-infectives (From admission, onward)   None       Subjective: Patient feels well her weakness is all but resolved.  She passed her swallow test was hungry and ate lunch without difficulty.  PT has signed off.  She continues to struggle with word  finding.  Objective: Vitals:   03/13/21 1415 03/13/21 1430 03/13/21 1445 03/13/21 1615  BP: (!) 153/99 140/73 125/67 126/68  Pulse: 94 77 85 83  Resp: 16 13 (!) 22 17  Temp:    98 F (36.7 C)  TempSrc:    Oral  SpO2: 94% 94% 95% 97%  Weight:      Height:       No intake or output data in the 24 hours ending 03/13/21 1656 Filed Weights   03/12/21 1858  Weight: 64 kg    Examination:  General exam: Appears calm and comfortable  Respiratory system: Clear to auscultation. Respiratory effort normal. Cardiovascular system: S1 & S2 heard, RRR.  Gastrointestinal system: Abdomen is nondistended, soft and nontender.  Central nervous system: Alert and oriented. No focal neurological deficits. Extremities: Symmetric  Skin: No rashes Psychiatry: Judgement and insight appear normal. Mood & affect appropriate.     Data Reviewed: I have personally reviewed following labs and imaging studies  CBC: Recent Labs  Lab 03/12/21 1900 03/12/21 1921  WBC 9.0  --   NEUTROABS 4.3  --   HGB 10.8* 10.9*  HCT 34.6* 32.0*  MCV 88.3  --   PLT 381  --    Basic Metabolic Panel: Recent Labs  Lab 03/12/21 1900 03/12/21 1921  NA 139 142  K 3.4* 3.3*  CL 110 109  CO2 19*  --   GLUCOSE 81 82  BUN 12 12  CREATININE 1.13* 1.00  CALCIUM 9.3  --    GFR: Estimated Creatinine Clearance: 40 mL/min (by C-G formula based on SCr of 1 mg/dL). Liver Function Tests: Recent Labs  Lab 03/12/21 1900  AST 27  ALT 17  ALKPHOS 47  BILITOT 0.4  PROT 6.2*  ALBUMIN 3.6   Coagulation Profile: Recent Labs  Lab 03/12/21 1900  INR 1.2   HbA1C: Recent Labs    03/12/21 2036  HGBA1C 5.7*   Lipid Profile: Recent Labs    03/12/21 2036  LDLDIRECT 71.3     Recent Results (from the past 240 hour(s))  Resp Panel by RT-PCR (Flu A&B, Covid) Nasopharyngeal Swab     Status: None   Collection Time: 03/12/21  6:40 PM   Specimen: Nasopharyngeal Swab; Nasopharyngeal(NP) swabs in vial transport medium   Result Value Ref Range Status   SARS Coronavirus 2 by RT PCR NEGATIVE NEGATIVE Final    Comment: (NOTE) SARS-CoV-2 target nucleic acids are NOT DETECTED.  The SARS-CoV-2 RNA is generally detectable in upper respiratory specimens during the acute phase of infection. The lowest concentration of SARS-CoV-2 viral copies this assay can detect is 138 copies/mL. A negative result does not preclude SARS-Cov-2 infection and should not be used as the sole basis for treatment or other patient management decisions. A negative result may occur with  improper specimen collection/handling, submission of specimen other than nasopharyngeal swab, presence of viral mutation(s) within the areas targeted by this assay, and inadequate number of viral copies(<138 copies/mL). A negative result must be combined with clinical observations, patient history, and epidemiological information. The expected result is Negative.  Fact Sheet for Patients:  BloggerCourse.com  Fact Sheet for Healthcare Providers:  SeriousBroker.it  This test is no t yet approved or cleared by the [place] FDA and  has been authorized for detection and/or diagnosis of SARS-CoV-2 by FDA under an Emergency Use Authorization (EUA). This EUA will remain  in effect (meaning this test can be used) for the duration of the COVID-19 declaration under Section 564(b)(1) of the Act, 21 U.S.C.section 360bbb-3(b)(1), unless the authorization is terminated  or revoked sooner.       Influenza A by PCR NEGATIVE NEGATIVE Final   Influenza B by PCR NEGATIVE NEGATIVE Final    Comment: (NOTE) The Xpert Xpress SARS-CoV-2/FLU/RSV plus assay is intended as an aid in the diagnosis of influenza from Nasopharyngeal swab specimens and should not be used as a sole basis for treatment. Nasal washings and aspirates are unacceptable for Xpert Xpress SARS-CoV-2/FLU/RSV testing.  Fact Sheet for  Patients: BloggerCourse.com  Fact Sheet for Healthcare Providers: SeriousBroker.it  This test is not yet approved or cleared by the [place] FDA and has been authorized for detection and/or diagnosis of SARS-CoV-2 by FDA under an Emergency Use Authorization (EUA). This EUA will remain in effect (meaning this test can be used) for the duration of the COVID-19 declaration under Section 564(b)(1) of the Act, 21 U.S.C. section 360bbb-3(b)(1), unless the authorization is terminated or revoked.  Performed at [facility], [address], [place], [place] [address]       Radiology Studies: CT ANGIO HEAD W OR WO CONTRAST  Result Date: 03/12/2021 CLINICAL DATA:  Acute neurologic deficit.  Aphasia. EXAM: CT ANGIOGRAPHY HEAD AND NECK TECHNIQUE: Multidetector CT imaging of the head and neck was performed using the standard protocol during bolus administration of intravenous contrast. Multiplanar CT image reconstructions and MIPs were obtained to evaluate the vascular  anatomy. Carotid stenosis measurements (when applicable) are obtained utilizing NASCET criteria, using the distal internal carotid diameter as the denominator. CONTRAST:  OMNIPAQUE IOHEXOL 350 MG/ML SOLN COMPARISON:  None. Head CT 03/12/2021 FINDINGS: CTA NECK FINDINGS SKELETON: Bone island at T1. OTHER NECK: Debris in the esophagus UPPER CHEST: No pneumothorax or pleural effusion. No nodules or masses. AORTIC ARCH: There is calcific atherosclerosis of the aortic arch. There is no aneurysm, dissection or hemodynamically significant stenosis of the visualized portion of the aorta. Conventional 3 vessel aortic branching pattern. The visualized proximal subclavian arteries are widely patent. RIGHT CAROTID SYSTEM: Normal without aneurysm, dissection or stenosis. LEFT CAROTID SYSTEM: Normal without aneurysm, dissection or stenosis. VERTEBRAL ARTERIES: Left dominant configuration.  Both origins are clearly patent. There is no dissection, occlusion or flow-limiting stenosis to the skull base (V1-V3 segments). CTA HEAD FINDINGS POSTERIOR CIRCULATION: --Vertebral arteries: Normal V4 segments. --Inferior cerebellar arteries: Normal. --Basilar artery: Normal. --Superior cerebellar arteries: Normal. --Posterior cerebral arteries (PCA): Normal. ANTERIOR CIRCULATION: --Intracranial internal carotid arteries: Normal. --Anterior cerebral arteries (ACA): Normal. Absent left A1 segment, normal variant --Middle cerebral arteries (MCA): Normal. VENOUS SINUSES: As permitted by contrast timing, patent. ANATOMIC VARIANTS: Absent left ACA A1 segment, a common variant. Review of the MIP images confirms the above findings. IMPRESSION: 1. No emergent large vessel occlusion or high-grade stenosis of the intracranial or cervical arteries. 2. Debris in the esophagus, suggesting gastroesophageal reflux. Aortic Atherosclerosis (ICD10-I70.0). Electronically Signed   By: Deatra Robinson M.D.   On: 03/12/2021 20:17   CT HEAD WO CONTRAST  Result Date: 03/12/2021 CLINICAL DATA:  Acute neuro deficit.  Aphasia EXAM: CT HEAD WITHOUT CONTRAST TECHNIQUE: Contiguous axial images were obtained from the base of the skull through the vertex without intravenous contrast. COMPARISON:  MRI head 02/14/2012 FINDINGS: Brain: No evidence of acute infarction, hemorrhage, hydrocephalus, extra-axial collection or mass lesion/mass effect. Vascular: Negative for hyperdense vessel Skull: Negative Sinuses/Orbits: Negative Other: None IMPRESSION: Negative CT head Electronically Signed   By: Marlan Palau M.D.   On: 03/12/2021 19:33   CT ANGIO NECK W OR WO CONTRAST  Result Date: 03/12/2021 CLINICAL DATA:  Acute neurologic deficit.  Aphasia. EXAM: CT ANGIOGRAPHY HEAD AND NECK TECHNIQUE: Multidetector CT imaging of the head and neck was performed using the standard protocol during bolus administration of intravenous contrast. Multiplanar CT  image reconstructions and MIPs were obtained to evaluate the vascular anatomy. Carotid stenosis measurements (when applicable) are obtained utilizing NASCET criteria, using the distal internal carotid diameter as the denominator. CONTRAST:  OMNIPAQUE IOHEXOL 350 MG/ML SOLN COMPARISON:  None. Head CT 03/12/2021 FINDINGS: CTA NECK FINDINGS SKELETON: Bone island at T1. OTHER NECK: Debris in the esophagus UPPER CHEST: No pneumothorax or pleural effusion. No nodules or masses. AORTIC ARCH: There is calcific atherosclerosis of the aortic arch. There is no aneurysm, dissection or hemodynamically significant stenosis of the visualized portion of the aorta. Conventional 3 vessel aortic branching pattern. The visualized proximal subclavian arteries are widely patent. RIGHT CAROTID SYSTEM: Normal without aneurysm, dissection or stenosis. LEFT CAROTID SYSTEM: Normal without aneurysm, dissection or stenosis. VERTEBRAL ARTERIES: Left dominant configuration. Both origins are clearly patent. There is no dissection, occlusion or flow-limiting stenosis to the skull base (V1-V3 segments). CTA HEAD FINDINGS POSTERIOR CIRCULATION: --Vertebral arteries: Normal V4 segments. --Inferior cerebellar arteries: Normal. --Basilar artery: Normal. --Superior cerebellar arteries: Normal. --Posterior cerebral arteries (PCA): Normal. ANTERIOR CIRCULATION: --Intracranial internal carotid arteries: Normal. --Anterior cerebral arteries (ACA): Normal. Absent left A1 segment, normal variant --Middle cerebral arteries (  MCA): Normal. VENOUS SINUSES: As permitted by contrast timing, patent. ANATOMIC VARIANTS: Absent left ACA A1 segment, a common variant. Review of the MIP images confirms the above findings. IMPRESSION: 1. No emergent large vessel occlusion or high-grade stenosis of the intracranial or cervical arteries. 2. Debris in the esophagus, suggesting gastroesophageal reflux. Aortic Atherosclerosis (ICD10-I70.0). Electronically Signed   By:  Deatra RobinsonKevin  Herman M.D.   On: 03/12/2021 20:17   MR BRAIN WO CONTRAST  Result Date: 03/12/2021 CLINICAL DATA:  Acute aphasia EXAM: MRI HEAD WITHOUT CONTRAST TECHNIQUE: Multiplanar, multiecho pulse sequences of the brain and surrounding structures were obtained without intravenous contrast. COMPARISON:  02/14/2012 FINDINGS: Brain: Multiple areas of acute ischemia within the left parietal lobe, predominantly involving the cortex. No acute or chronic hemorrhage. Minimal multifocal hyperintense T2-weight signal within the white matter. The midline structures are normal. Vascular: Major flow voids are preserved. Skull and upper cervical spine: Normal calvarium and skull base. Visualized upper cervical spine and soft tissues are normal. Sinuses/Orbits:No paranasal sinus fluid levels or advanced mucosal thickening. No mastoid or middle ear effusion. Normal orbits. IMPRESSION: Multiple areas of acute ischemia within the left parietal lobe, predominantly cortical. No hemorrhage or mass effect. Electronically Signed   By: Deatra RobinsonKevin  Herman M.D.   On: 03/12/2021 23:34   VAS US LOWER EXTREMITY VENOUS (DVT)  Result Date: 03/13/2021  Lower Venous DVT Study Indications: Embolic stroke.  Comparison Study: No prior studies. Performing Technologist: Jean Rosenthalachel Hodge RDMS,RVT  Examination Guidelines: A complete evaluation includes B-mode imaging, spectral Doppler, color Doppler, and power Doppler as needed of all accessible portions of each vessel. Bilateral testing is considered an integral part of a complete examination. Limited examinations for reoccurring indications may be performed as noted. The reflux portion of the exam is performed with the patient in reverse Trendelenburg.  +---------+---------------+---------+-----------+----------+--------------+ RIGHT    CompressibilityPhasicitySpontaneityPropertiesThrombus Aging +---------+---------------+---------+-----------+----------+--------------+ CFV      Full           Yes       Yes                                 +---------+---------------+---------+-----------+----------+--------------+ SFJ      Full                                                        +---------+---------------+---------+-----------+----------+--------------+ FV Prox  Full                                                        +---------+---------------+---------+-----------+----------+--------------+ FV Mid   Full                                                        +---------+---------------+---------+-----------+----------+--------------+ FV DistalFull                                                        +---------+---------------+---------+-----------+----------+--------------+  PFV      Full                                                        +---------+---------------+---------+-----------+----------+--------------+ POP      Full           Yes      Yes                                 +---------+---------------+---------+-----------+----------+--------------+ PTV      Full                                                        +---------+---------------+---------+-----------+----------+--------------+ PERO     Full                                                        +---------+---------------+---------+-----------+----------+--------------+   +---------+---------------+---------+-----------+----------+--------------+ LEFT     CompressibilityPhasicitySpontaneityPropertiesThrombus Aging +---------+---------------+---------+-----------+----------+--------------+ CFV      Full           Yes      Yes                                 +---------+---------------+---------+-----------+----------+--------------+ SFJ      Full                                                        +---------+---------------+---------+-----------+----------+--------------+ FV Prox  Full                                                         +---------+---------------+---------+-----------+----------+--------------+ FV Mid   Full                                                        +---------+---------------+---------+-----------+----------+--------------+ FV DistalFull                                                        +---------+---------------+---------+-----------+----------+--------------+ PFV      Full                                                        +---------+---------------+---------+-----------+----------+--------------+  POP      Full           Yes      Yes                                 +---------+---------------+---------+-----------+----------+--------------+ PTV      Full                                                        +---------+---------------+---------+-----------+----------+--------------+ PERO     Full                                                        +---------+---------------+---------+-----------+----------+--------------+     Summary: RIGHT: - There is no evidence of deep vein thrombosis in the lower extremity.  - No cystic structure found in the popliteal fossa.  LEFT: - There is no evidence of deep vein thrombosis in the lower extremity.  - No cystic structure found in the popliteal fossa.  *See table(s) above for measurements and observations.    Preliminary      Scheduled Meds: . aspirin  81 mg Oral Daily  . calcium carbonate  1 tablet Oral BID WC  . clopidogrel  75 mg Oral Daily  . enoxaparin (LOVENOX) injection  40 mg Subcutaneous Q24H  . levothyroxine  25 mcg Oral Daily  . multivitamin  1 tablet Oral Daily  . multivitamin with minerals  1 tablet Oral Daily  . pantoprazole  40 mg Oral Daily  . PARoxetine  10 mg Oral Daily  . simvastatin  40 mg Oral q1800   Continuous Infusions: . sodium chloride 40 mL/hr at 03/13/21 0046     LOS: 0 days    Tiffany Bores, Tiffany House 03/13/2021 4:56 PM [phone] [facility] If 7PM-7AM, please  contact night-coverage 03/13/2021, 4:56 PM

## 2021-03-13 NOTE — Evaluation (Signed)
Physical Therapy Evaluation Patient Details Name: Tiffany House MRN: [id number] DOB: 03-Aug-1946 Today's Date: 03/13/2021   History of Present Illness  Pt presented to ED on 4/14 with aphasia. MRI showed multiple areas of acute ischemia within the left parietal lobe. PMH - arthritis, HTN, bppv  Clinical Impression  Pt doing well with mobility and no further PT needed.  Ready for dc from PT standpoint.      Follow Up Recommendations No PT follow up    Equipment Recommendations  None recommended by PT    Recommendations for Other Services       Precautions / Restrictions Precautions Precautions: None      Mobility  Bed Mobility Overal bed mobility: Independent                  Transfers Overall transfer level: Independent Equipment used: None                Ambulation/Gait Ambulation/Gait assistance: Independent Gait Distance (Feet): 800 Feet Assistive device: None Gait Pattern/deviations: WFL(Within Functional Limits) Gait velocity: normal Gait velocity interpretation: >4.37 ft/sec, indicative of normal walking speed General Gait Details: Steady gait including head turns, changes in speed, direction changes. Also able to step over simulated object. Able to pick up object from the floor.  Stairs            Wheelchair Mobility    Modified Rankin (Stroke Patients Only) Modified Rankin (Stroke Patients Only) Pre-Morbid Rankin Score: No symptoms Modified Rankin: No symptoms     Balance Overall balance assessment: Independent                                           Pertinent Vitals/Pain Pain Assessment: No/denies pain    Home Living Family/patient expects to be discharged to:: Private residence Living Arrangements: Alone   Type of Home: House Home Access: Stairs to enter Entrance Stairs-Rails: Right Entrance Stairs-Number of Steps: several Home Layout: One level Home Equipment: None      Prior Function Level of  Independence: Independent         Comments: Drives     Hand Dominance        Extremity/Trunk Assessment   Upper Extremity Assessment Upper Extremity Assessment: Overall WFL for tasks assessed    Lower Extremity Assessment Lower Extremity Assessment: Overall WFL for tasks assessed    Cervical / Trunk Assessment Cervical / Trunk Assessment: Normal  Communication   Communication: Expressive difficulties  Cognition Arousal/Alertness: Awake/alert Behavior During Therapy: WFL for tasks assessed/performed Overall Cognitive Status: Within Functional Limits for tasks assessed                                        General Comments      Exercises     Assessment/Plan    PT Assessment Patent does not need any further PT services  PT Problem List         PT Treatment Interventions      PT Goals (Current goals can be found in the Care Plan section)  Acute Rehab PT Goals PT Goal Formulation: All assessment and education complete, DC therapy    Frequency     Barriers to discharge        Co-evaluation  AM-PAC PT "6 Clicks" Mobility  Outcome Measure Help needed turning from your back to your side while in a flat bed without using bedrails?: None Help needed moving from lying on your back to sitting on the side of a flat bed without using bedrails?: None Help needed moving to and from a bed to a chair (including a wheelchair)?: None Help needed standing up from a chair using your arms (e.g., wheelchair or bedside chair)?: None Help needed to walk in hospital room?: None Help needed climbing 3-5 steps with a railing? : None 6 Click Score: 24    End of Session   Activity Tolerance: Patient tolerated treatment well Patient left: in bed;with call bell/phone within reach   PT Visit Diagnosis: Other symptoms and signs involving the nervous system (R29.898)    Time: 1457-1510 PT Time Calculation (min) (ACUTE ONLY): 13  min   Charges:   PT Evaluation $PT Eval Low Complexity: 1 Low          [facility] PT Acute Rehabilitation Services Pager [phone] Office [phone]   Angelina Ok [facility] 03/13/2021, 3:24 PM

## 2021-03-13 NOTE — Progress Notes (Addendum)
STROKE TEAM PROGRESS NOTE   INTERVAL HISTORY No acute events since arrival.  While planting in pots on her deck yesterday she started feeling strange. She went into the kitchen to make tea but could not figure out how to make the microwave work. She got scared and walked to close neighbor's house. Neighbors noticed talking difficulty and called EMS.  Dons her hearing aids and glasses independently for our visit. Today she is reporting she left parietal region headache. She had a couple of migraines in her 37s and never again. She reports occasional mild headaches responsive to tylenol.  She is still notice word finding difficulty.  She tried 4 blood pressure medications 12 years ago but they all made her dizzy and she stopped them.  She lives in a townhouse alone. A lot of family dynamics going on with illnesses and minimal support available.   Alert, continues to have mild to moderate word finding difficulty. No RUE weakness apparent today. Nurse reports she passed her swallow screen. She is hungry and wants food ASAP.   We discussed her stroke diagnosis, ongoing work up and plan of care including the importance of risk factor control.     Vitals:   03/13/21 0900 03/13/21 0915 03/13/21 0930 03/13/21 0945  BP: 129/63 128/72 140/77 (!) 151/69  Pulse: 69 68 75 61  Resp: 10 10 14 12   Temp:      TempSrc:      SpO2: 97% 91% 98% 98%  Weight:      Height:       CBC:  Recent Labs  Lab 03/12/21 1900 03/12/21 1921  WBC 9.0  --   NEUTROABS 4.3  --   HGB 10.8* 10.9*  HCT 34.6* 32.0*  MCV 88.3  --   PLT 381  --    Basic Metabolic Panel:  Recent Labs  Lab 03/12/21 1900 03/12/21 1921  NA 139 142  K 3.4* 3.3*  CL 110 109  CO2 19*  --   GLUCOSE 81 82  BUN 12 12  CREATININE 1.13* 1.00  CALCIUM 9.3  --    Lipid Panel: No results for input(s): CHOL, TRIG, HDL, CHOLHDL, VLDL, LDLCALC in the last 168 hours. HgbA1c:  Recent Labs  Lab 03/12/21 2036  HGBA1C 5.7*   Urine Drug  Screen:  Recent Labs  Lab 03/12/21 2050  LABOPIA NONE DETECTED  COCAINSCRNUR NONE DETECTED  LABBENZ NONE DETECTED  AMPHETMU NONE DETECTED  THCU NONE DETECTED  LABBARB NONE DETECTED    Alcohol Level  Recent Labs  Lab 03/12/21 1900  ETH <10   IMAGING: MRI Multiple areas of acute ischemia within the left parietal lobe,predominantly cortical. No hemorrhage or mass effect.  CTA Head and Neck 1. No emergent large vessel occlusion or high-grade stenosis of the intracranial or cervical arteries. 2. Debris in the esophagus, suggesting gastroesophageal reflux.  CT Head Negative CT head  PHYSICAL EXAM  Temp:  [97.6 F (36.4 C)] 97.6 F (36.4 C) (04/14 1856) Pulse Rate:  [61-106] 77 (04/15 1200) Resp:  [10-25] 14 (04/15 1200) BP: (111-188)/(59-102) 142/80 (04/15 1200) SpO2:  [91 %-99 %] 97 % (04/15 1200) Weight:  [64 kg] 64 kg (04/14 1858)  General - Well nourished, well developed elderly female lying on ED stretcher in no apparent distress. Cardiovascular - Regular rhythm and rate. Psych-Calm and cooperative  Mental Status -  Level of arousal and orientation to time, place, and person were intact.  Language was comprehensible but abnormal with moderate word finding difficulty.  Multiple errors with attempts to repeat. Able to name 3/3 objects. Able to read 5 word sentence.  Attention span and concentration were normal. Recent and remote memory were intact. . Cranial Nerves II - XII - II - Visual field intact OU. III, IV, VI - Extraocular movements intact. V - Facial sensation intact bilaterally. VII - Facial movement intact bilaterally. VIII - Hearing  intact bilaterally to voice (hearing aids in) X - Palate elevates symmetrically. XI - Chin turning & shoulder shrug intact bilaterally. XII - Tongue protrusion intact.  Motor Strength - The patient's strength was normal in all extremities and pronator drift was absent.  Bulk was normal and fasciculations were absent.    Motor Tone - Muscle tone was assessed at the neck and appendages and was normal.  Sensory - Light touch, temperature/pinprick were assessed and were symmetrical.  Coordination - The patient had normal movements in the hands and feet with no ataxia or dysmetria. Tremor was absent.  Gait and Station - deferred.  ASSESSMENT/PLAN Tiffany House is a 75 y.o. female with hx HTN, HL, BPPV who presents with acute onset aphasia. Outside window for tPA. No indication for acute intervention given no LVO.   Stroke - Multiple areas of acute ischemia within the left parietal lobe, predominantly cortical with possible emoblic source, work up in progress.    2D echo is pending  Bilat LE doppler study is pending  Consider LOOP recorder: EP contacted, per Keitha Butte, PA will be scheduled as outpatient if patient is discharged from ED.   Expressive aphasia perists  Passed bedside swallow evaluation.   Stroke PPX: DAPT with plavix 75mg  and ASA 81mg  x 3 weeks then ASA 81mg  alone.   Therapy recommendations:  TBD.   Disposition:  TBD  Hypertension  Home meds:  None  Hypertensive this admission to 180 systolic.   Recommended to be on antihypertensive in the past but stopped taking due to dizziness (reportedly tried 4 meds) . Permissive hypertension (OK if < 220/120) but gradually normalize in 5-7 days . Long-term BP goal normotensive  Hyperlipidemia  Home meds: Zocor 20  LDL 71.3, not at goal < 70   Increase with Zocor 40  Continue statin at discharge  Glucose management  No hx DM  HgbA1c 5.7, goal < 7.0  Other Stroke Risk Factors  Advanced Age >/= 57   Current ETOH use, alcohol level <10, advised to drink no more than 1 drink a day  Overweight, Body mass index is 27.56 kg/m., BMI >/= 30 associated with increased stroke risk, recommend weight loss, diet and exercise as appropriate   Migraines  Other Active Problems  Hospital day # 0  This plan of care was directed by Dr. .   , NP-C  ATTENDING NOTE: I reviewed above note and agree with the assessment and plan. Pt was seen and examined.   75 year old female with history of hypertension, hyperlipidemia, migraine admitted for confusion, aphasia and speech difficulty.  Initially she had mild right-sided weakness but currently resolved.  CT no acute abnormality.  CT head and neck unremarkable.  MRI showed left MCA infarct.  2D echo pending, LE venous Doppler pending.  LDL 71.3, A1c 5.7.  Creatinine 1.00, UDS negative.  On exam, patient lying in bed, awake alert, orientated x3.  Still has intermittent hesitancy of speech and paraphasic errors.  However able to name and repeat without a problem.  Follow simple commands.  Cranial nerve intact, bilateral upper and lower extremity strength  and sensation symmetrical.  No ataxia or dysmetria.  Etiology for patient current stroke concerning for cardioembolic source.  Patient denies any heart palpitation, current telemetry monitoring did not show any A. fib.  However given her age and risk factors, recommend loop recorder to rule out A. Fib.  Continue aspirin 81 and Plavix 75 DAPT for 3 weeks and then aspirin alone.  Increase Zocor to 40 for cholesterol control.  PT/OT pending.  Will follow.  Marvel Plan, MD PhD Stroke Neurology 03/13/2021 2:54 PM     To contact Stroke Continuity provider, please refer to [email or web address]. After hours, contact General Neurology

## 2021-03-13 NOTE — Progress Notes (Signed)
Lower extremity venous bilateral study completed.   Please see CV Proc for preliminary results.   Eron Goble, RDMS, RVT  

## 2021-03-13 NOTE — Progress Notes (Signed)
Patient documented as full code on admission.  Notified by RN that patient does NOT wish to be full code.  I spoke to the patient and discussed CODE STATUS.  She wishes to be DNR/DNI.  CODE STATUS has been changed as per patient's wishes.

## 2021-03-14 ENCOUNTER — Other Ambulatory Visit (HOSPITAL_COMMUNITY): Payer: Medicare HMO

## 2021-03-14 ENCOUNTER — Inpatient Hospital Stay (HOSPITAL_COMMUNITY): Payer: Medicare HMO

## 2021-03-14 DIAGNOSIS — Z83438 Family history of other disorder of lipoprotein metabolism and other lipidemia: Secondary | ICD-10-CM | POA: Diagnosis not present

## 2021-03-14 DIAGNOSIS — Z882 Allergy status to sulfonamides status: Secondary | ICD-10-CM | POA: Diagnosis not present

## 2021-03-14 DIAGNOSIS — I10 Essential (primary) hypertension: Secondary | ICD-10-CM | POA: Diagnosis not present

## 2021-03-14 DIAGNOSIS — I639 Cerebral infarction, unspecified: Secondary | ICD-10-CM | POA: Diagnosis not present

## 2021-03-14 DIAGNOSIS — J309 Allergic rhinitis, unspecified: Secondary | ICD-10-CM | POA: Diagnosis present

## 2021-03-14 DIAGNOSIS — I6389 Other cerebral infarction: Secondary | ICD-10-CM

## 2021-03-14 DIAGNOSIS — R471 Dysarthria and anarthria: Secondary | ICD-10-CM | POA: Diagnosis present

## 2021-03-14 DIAGNOSIS — I63112 Cerebral infarction due to embolism of left vertebral artery: Secondary | ICD-10-CM | POA: Diagnosis not present

## 2021-03-14 DIAGNOSIS — Z79899 Other long term (current) drug therapy: Secondary | ICD-10-CM | POA: Diagnosis not present

## 2021-03-14 DIAGNOSIS — E782 Mixed hyperlipidemia: Secondary | ICD-10-CM | POA: Diagnosis not present

## 2021-03-14 DIAGNOSIS — R4701 Aphasia: Secondary | ICD-10-CM | POA: Diagnosis not present

## 2021-03-14 DIAGNOSIS — Z79890 Hormone replacement therapy (postmenopausal): Secondary | ICD-10-CM | POA: Diagnosis not present

## 2021-03-14 DIAGNOSIS — I63412 Cerebral infarction due to embolism of left middle cerebral artery: Principal | ICD-10-CM

## 2021-03-14 DIAGNOSIS — Z20822 Contact with and (suspected) exposure to covid-19: Secondary | ICD-10-CM | POA: Diagnosis not present

## 2021-03-14 DIAGNOSIS — R29702 NIHSS score 2: Secondary | ICD-10-CM | POA: Diagnosis present

## 2021-03-14 DIAGNOSIS — H8110 Benign paroxysmal vertigo, unspecified ear: Secondary | ICD-10-CM | POA: Diagnosis present

## 2021-03-14 DIAGNOSIS — E039 Hypothyroidism, unspecified: Secondary | ICD-10-CM | POA: Diagnosis not present

## 2021-03-14 DIAGNOSIS — G43909 Migraine, unspecified, not intractable, without status migrainosus: Secondary | ICD-10-CM | POA: Diagnosis not present

## 2021-03-14 DIAGNOSIS — E785 Hyperlipidemia, unspecified: Secondary | ICD-10-CM | POA: Diagnosis not present

## 2021-03-14 DIAGNOSIS — F4320 Adjustment disorder, unspecified: Secondary | ICD-10-CM | POA: Diagnosis present

## 2021-03-14 DIAGNOSIS — Z66 Do not resuscitate: Secondary | ICD-10-CM | POA: Diagnosis not present

## 2021-03-14 DIAGNOSIS — Z91013 Allergy to seafood: Secondary | ICD-10-CM | POA: Diagnosis not present

## 2021-03-14 DIAGNOSIS — K219 Gastro-esophageal reflux disease without esophagitis: Secondary | ICD-10-CM | POA: Diagnosis not present

## 2021-03-14 DIAGNOSIS — I498 Other specified cardiac arrhythmias: Secondary | ICD-10-CM | POA: Diagnosis present

## 2021-03-14 DIAGNOSIS — Z8249 Family history of ischemic heart disease and other diseases of the circulatory system: Secondary | ICD-10-CM | POA: Diagnosis not present

## 2021-03-14 LAB — ECHOCARDIOGRAM COMPLETE BUBBLE STUDY
AR max vel: 2.57 cm2
AV Area VTI: 2.19 cm2
AV Area mean vel: 2.35 cm2
AV Mean grad: 3 mmHg
AV Peak grad: 4.8 mmHg
Ao pk vel: 1.09 m/s
Area-P 1/2: 2.56 cm2
MV VTI: 1.86 cm2
S' Lateral: 2.5 cm

## 2021-03-14 MED ORDER — ASPIRIN 81 MG PO CHEW: 81.0000 mg | CHEWABLE_TABLET | Freq: Every day | ORAL | 1 refills | Status: AC

## 2021-03-14 MED ORDER — SIMVASTATIN 40 MG PO TABS: 40.0000 mg | ORAL_TABLET | Freq: Every day | ORAL | 1 refills | Status: DC

## 2021-03-14 MED ORDER — CLOPIDOGREL BISULFATE 75 MG PO TABS: 75.0000 mg | ORAL_TABLET | Freq: Every day | ORAL | 0 refills | Status: AC

## 2021-03-14 NOTE — Progress Notes (Signed)
Reviewed  Discharge education with pt, no questions or concerns. Pt safely discharged, wheeled down by nurse tech.

## 2021-03-14 NOTE — Evaluation (Signed)
Speech Language Pathology Evaluation Patient Details Name: Tiffany House MRN: [id number] DOB: 1946-07-03 Today's Date: 03/14/2021 Time: 1415-1450 SLP Time Calculation (min) (ACUTE ONLY): 35 min  Problem List:  Patient Active Problem List   Diagnosis Date Noted  . CVA (cerebral vascular accident) (HCC) 03/14/2021  . Aphasia 03/12/2021  . Cerebrovascular accident (CVA) (HCC) 03/12/2021  . Diverticulitis 03/10/2021  . Diarrhea 03/10/2021  . Degenerative arthritis of right knee 12/02/2020  . Routine general medical examination at a health care facility 12/08/2018  . Bilateral impacted cerumen 05/03/2018  . Presbycusis of both ears 05/03/2018  . Adjustment disorder 06/05/2015  . Primary localized osteoarthrosis, lower leg 01/09/2014  . Hypothyroidism 11/02/2010  . Osteoporosis   . Hyperlipidemia 12/23/2008  . GERD 05/21/2008   Past Medical History:  Past Medical History:  Diagnosis Date  . Allergic rhinitis, cause unspecified   . Arthritis   . Benign positional vertigo   . GERD   . HYPERLIPIDEMIA   . Hypertension   . HYPOTHYROIDISM dx 10/2010  . Low back pain    MRI 02/2011  . Migraines   . OSTEOPENIA   . Osteoporosis    Qualifier: Diagnosis of  By: Nena Jordan   . RENAL CALCULUS, RIGHT    L 01/2012   Past Surgical History:  Past Surgical History:  Procedure Laterality Date  . HAND SURGERY  2001   Dr Teressa Senter-- surgery of right hand to rebuild joint  . HERNIA REPAIR  03/2016  . Kidney stone removed  01/12/10  . TUBAL LIGATION  1979   HPI:  The patient is a 75 y.o. year-old w/ hx of migraine HA, back pain, HTN, hypothyroidism, HL, GERD, BPV admitted to ED 4/14 for stroke workup. MRI 4/14: "Multiple areas of acute ischemia within the left parietal lobe,  predominantly cortical. No hemorrhage or mass effect."   Assessment / Plan / Recommendation Clinical Impression  Pt presents with mild expressive deficits in setting of recent CVA, with deficits in written  expression greatern than verbal expression.  Pt was assessed using the Western Aphasia Battery - Bedside Form (see below for additional information).  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 Pt did not have any errors in oral/limb apraxia screening. She did not exhibit difficulty with naming.  Expressive errors remained similar across spontaneous speech and reading aloud.  No dysarthria was noted and there were no focal deficits on OME outside of slight R asymmetry with lingual protrusion  She was noted to have difficulty with consonant clusters and multisyllable words in conversation. Numbers seemed to pose particular difficulty early on in session. Pt was consistently aware of errors and was able to self correct most of them, although at times with effort.  Errors were inconsistent "pew" for "few" but no errors with f-initial words "fan" "fancy" and "fantastic."  Pt performed well on repetition task, only errors were word final consonant cluster reduction x2.  Pt did not exhibit any errors in comprehension.  With written expression there were no letter formation errors, which were all capital letters, but pt did have difficulty with spelling, especially with longer words, and was able to express herself more clearly verbally.  For the sentence "I am confused in my break and my head."  Pt wrote "A MI(crossed out) I AM A FGSIGGING IN MY BRAIN HEADD." Pt was able to speak  about her condition much more eloquently, and has noted some improvement in her verbal expression since onset.  Pt reports not having difficulty reading, and in fact has ready nearly two thirds of a book she has received yesterday.  Pt exhibited excellent comprehension of section read aloud and of overall plot of book.  She is a voracious reader with an excellent vocabulary.  Pt  describes use of word synonym substitution strategy when she has difficulty getting a word out, and demonstrated ability to self correct in conversation throughout session.  Pt is an excellent speech therapy candidate.  She is motivated, aware of errors, and is already demonstrating use of compensatory strategies.  Recommend speech therapy in acute care setting and continuing at next level of care after discharge.   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)    Follow Up Recommendations  Outpatient SLP;Home health SLP (Continue ST after discharge)    Frequency and Duration min 2x/week  2 weeks      SLP Evaluation Cognition  Overall Cognitive Status: Within Functional Limits for tasks assessed Orientation Level: Oriented X4       Comprehension  Auditory Comprehension Overall Auditory Comprehension: Appears within functional limits for tasks assessed Yes/No Questions: Within Functional Limits Commands: Within Functional Limits Conversation: Complex Interfering Components: Hearing EffectiveTechniques: Repetition    Expression Expression Primary Mode of Expression: Verbal Verbal Expression Overall Verbal Expression: Appears within functional limits for tasks assessed Level of Generative/Spontaneous Verbalization: Conversation Repetition: No impairment Naming: No impairment Pragmatics: No impairment   Oral / Motor  Oral Motor/Sensory Function Overall Oral Motor/Sensory Function: Mild impairment Facial ROM: Within Functional Limits Facial Symmetry: Within Functional Limits Lingual Symmetry: Abnormal symmetry right Lingual Strength:  Within Functional Limits Lingual Sensation: Within Functional Limits Velum: Within Functional Limits Mandible: Within Functional Limits Motor Speech Respiration: Within functional limits Phonation: Normal Resonance: Within functional limits Articulation: Within functional limitis Intelligibility: Intelligible Motor Planning: Impaired Level of Impairment: Press photographer Errors: Aware;Inconsistent Effective Techniques: Slow rate;Over-articulate   GO                    Kerrie Pleasure, MA, CCC-SLP Acute Rehabilitation Services Office: [phone] 03/14/2021, 3:37 PM

## 2021-03-14 NOTE — Plan of Care (Signed)

## 2021-03-14 NOTE — Progress Notes (Addendum)
STROKE TEAM PROGRESS NOTE   INTERVAL HISTORY No acute events overnight.  No family at bedside.  Patient lying in bed, stated that her speech has some improvement from yesterday but still not normal.  EF 55 to 60%.  DVT negative.  Speech recommends outpatient speech.  Will arrange for outpatient loop recorder placement.   Vitals:   03/13/21 2348 03/14/21 0425 03/14/21 1213 03/14/21 1651  BP: 123/67 119/62 (!) 156/78 126/70  Pulse: 71 64 85 89  Resp: 16 18 19 18   Temp: (!) 97.5 F (36.4 C) 97.6 F (36.4 C) 97.6 F (36.4 C) 98.4 F (36.9 C)  TempSrc: Oral Oral Oral Oral  SpO2: 97% 98% 98% 97%  Weight:      Height:       CBC:  Recent Labs  Lab 03/12/21 1900 03/12/21 1921  WBC 9.0  --   NEUTROABS 4.3  --   HGB 10.8* 10.9*  HCT 34.6* 32.0*  MCV 88.3  --   PLT 381  --    Basic Metabolic Panel:  Recent Labs  Lab 03/12/21 1900 03/12/21 1921  NA 139 142  K 3.4* 3.3*  CL 110 109  CO2 19*  --   GLUCOSE 81 82  BUN 12 12  CREATININE 1.13* 1.00  CALCIUM 9.3  --    Lipid Panel: No results for input(s): CHOL, TRIG, HDL, CHOLHDL, VLDL, LDLCALC in the last 168 hours. HgbA1c:  Recent Labs  Lab 03/12/21 2036  HGBA1C 5.7*   Urine Drug Screen:  Recent Labs  Lab 03/12/21 2050  LABOPIA NONE DETECTED  COCAINSCRNUR NONE DETECTED  LABBENZ NONE DETECTED  AMPHETMU NONE DETECTED  THCU NONE DETECTED  LABBARB NONE DETECTED    Alcohol Level  Recent Labs  Lab 03/12/21 1900  ETH <10   IMAGING: MRI Multiple areas of acute ischemia within the left parietal lobe,predominantly cortical. No hemorrhage or mass effect.  CTA Head and Neck 1. No emergent large vessel occlusion or high-grade stenosis of the intracranial or cervical arteries. 2. Debris in the esophagus, suggesting gastroesophageal reflux.  CT Head Negative CT head  PHYSICAL EXAM  Temp:  [97.5 F (36.4 C)-98.4 F (36.9 C)] 98.4 F (36.9 C) (04/16 1651) Pulse Rate:  [64-89] 89 (04/16 1651) Resp:  [16-19] 18  (04/16 1651) BP: (119-156)/(60-79) 126/70 (04/16 1651) SpO2:  [97 %-98 %] 97 % (04/16 1651)  General - Well nourished, well developed elderly female lying on ED stretcher in no apparent distress. Cardiovascular - Regular rhythm and rate. Psych-Calm and cooperative  Mental Status -  Level of arousal and orientation to time, place, and person were intact.  Language was comprehensible but abnormal with moderate word finding difficulty. Multiple errors with attempts to repeat. Able to name 3/3 objects. Able to read 5 word sentence.  Attention span and concentration were normal. Recent and remote memory were intact. . Cranial Nerves II - XII - II - Visual field intact OU. III, IV, VI - Extraocular movements intact. V - Facial sensation intact bilaterally. VII - Facial movement intact bilaterally. VIII - Hearing  intact bilaterally to voice (hearing aids in) X - Palate elevates symmetrically. XI - Chin turning & shoulder shrug intact bilaterally. XII - Tongue protrusion intact.  Motor Strength - The patient's strength was normal in all extremities and pronator drift was absent.  Bulk was normal and fasciculations were absent.   Motor Tone - Muscle tone was assessed at the neck and appendages and was normal.  Sensory - Light touch,  temperature/pinprick were assessed and were symmetrical.  Coordination - The patient had normal movements in the hands and feet with no ataxia or dysmetria. Tremor was absent.  Gait and Station - deferred.  ASSESSMENT/PLAN Tiffany House is a 75 y.o. female with hx HTN, HLD, BPPV who presents with acute onset aphasia. Outside window for tPA. No indication for acute intervention given no LVO.   Stroke - Multiple areas of acute ischemia within the left parietal lobe, predominantly cortical with possible emoblic source, work up in progress.   CT no acute abnormality.    CT head and neck unremarkable.   MRI showed left MCA infarct.    2D echo EF 55 to  60%  LE venous Doppler no DVT.    Consider LOOP recorder to be scheduled as outpatient on 5/9 with EP  LDL 71.3  A1c 5.7  UDS negative.  Recommend DAPT with plavix 75mg  and ASA 81mg  x 3 weeks then ASA 81mg  alone.   Therapy recommendations: Outpatient speech  Disposition: Home today  Hypertension  Home meds:  None . BP stable . Long-term BP goal normotensive  Hyperlipidemia  Home meds: Zocor 20  LDL 71.3, not at goal < 70   Increase with Zocor 40  Continue statin at discharge  Other Stroke Risk Factors  Advanced Age >/= 82   Current ETOH use, alcohol level <10, advised to drink no more than 1 drink a day  Migraines  Other Active Problems  BPPV  Neurology will sign off. Please call with questions. Pt will follow up with stroke clinic NP at [facility] in about 4 weeks. Thanks for the consult.   , MD PhD Stroke Neurology 03/14/2021 6:17 PM     To contact Stroke Continuity provider, please refer to [facility]. After hours, contact General Neurology

## 2021-03-14 NOTE — Discharge Summary (Signed)
Physician Discharge Summary  Tiffany BernardLeslie S House PIR:[id number]:[id number] DOB: 1946/03/14 DOA: 03/12/2021  PCP: Myrlene Brokerrawford, Elizabeth A, MD  Admit date: 03/12/2021 Discharge date: 03/14/2021  Admitted From: ho Disposition:  home  Recommendations for Outpatient Follow-up:  1. Follow up with PCP in 1-2 weeks 2. Please obtain BMP/CBC in one week 3. Please follow up on the following pending results:  Home Health: No Equipment/Devices:none   Discharge Condition: Stable CODE STATUS: DNR Diet recommendation: heart healthy  Brief/Interim Summary: The patient is a75 y.o.year-old w/ hx of migraine HA, back pain, HTN, hypothyroidism, HL, GERD, BPV woke up today feeling fine but in mid afternoon about 2 pm she started feeling poorly per the ED notes. She took a nap but felt worse upon awakening and went to her neighbors who called 911. In ED her main issue was word finding difficulty. Was borderline outside the TPA window per ED MD. Neurology consulted and. Head CT showed no acute findings and CTA of head/ neck showed no LVO.   Discharge Diagnoses:  Active Problems:   Hypothyroidism   Hyperlipidemia   Aphasia   Cerebrovascular accident (CVA) (HCC)   CVA (cerebral vascular accident) (HCC)  Acute CVA - w/ aphasia. Seen by neurology. Outside of TPA window. CTA head negative, and CTA of head+ neck was w/o LVO. Other rec's per neurology are for  Permissive HTN for 48hrs MRI brain revealed acute ischemia left parietal lobe TTE w/ bubble negative Lower extremity Dopplers are negative ASA 325 x1, then 81 qd  Plavix 75 mg x 3 weeks Continue statin, LDL 71 - increased from 20-->40 mg Hemoglobin A1c is 5.7, less than 7.0 goal SLP as outpatient  Hypothyroidism - cont synthroid  Hyperlipidemia - statin  GERD - PPI    Discharge Instructions:  Discharge Instructions    Ambulatory referral to Neurology   Complete by: As directed    Follow up with stroke clinic NP (Jessica Vanschaick or Darrol Angelarolyn  Martin, if both not available, consider Manson AllanSethi, Penumali, or Ahern) [facility] in about 4 weeks. Thanks.   Ambulatory referral to Speech Therapy   Complete by: As directed    Call MD for:  difficulty breathing, headache or visual disturbances   Complete by: As directed    Call MD for:  persistant dizziness or light-headedness   Complete by: As directed    Call MD for:  persistant nausea and vomiting   Complete by: As directed    Diet - low sodium heart healthy   Complete by: As directed    Increase activity slowly   Complete by: As directed      Allergies as of 03/14/2021      Reactions   Shellfish Allergy Nausea And Vomiting   Scallops and oysters   Sulfonamide Derivatives Other (See Comments)   Reaction not recalled      Medication List    TAKE these medications   acetaminophen 325 MG tablet Commonly known as: TYLENOL Take 650 mg by mouth See admin instructions. Take 650 mg by mouth at bedtime and an additional 650 mg every 6 hours as needed for pain   aspirin 81 MG chewable tablet Chew 1 tablet (81 mg total) by mouth daily. Start taking on: March 15, 2021   calcium carbonate 1250 (500 Ca) MG tablet Commonly known as: OS-CAL - dosed in mg of elemental calcium Take 1 tablet by mouth 2 (two) times daily with a meal.   clopidogrel 75 MG tablet Commonly known as: PLAVIX Take 1 tablet (75  mg total) by mouth daily for 20 days. Start taking on: March 15, 2021   diclofenac sodium 1 % Gel Commonly known as: VOLTAREN Apply 2 g topically 4 (four) times daily. What changed:   when to take this  reasons to take this   dicyclomine 20 MG tablet Commonly known as: BENTYL Take 1 tablet (20 mg total) by mouth 2 (two) times daily as needed for spasms (abdominal paoin).   fish oil-omega-3 fatty acids 1000 MG capsule Take 1 g by mouth every evening.   levothyroxine 25 MCG tablet Commonly known as: SYNTHROID Take 1 tablet (25 mcg total) by mouth daily.   meloxicam 7.5 MG  tablet Commonly known as: MOBIC Take 7.5-15 mg by mouth daily as needed for pain.   multivitamin tablet Take 1 tablet by mouth daily.   multivitamin-lutein Caps capsule Take 1 capsule by mouth daily.   omeprazole 40 MG capsule Commonly known as: PRILOSEC Take 1 capsule (40 mg total) by mouth daily.   ondansetron 4 MG disintegrating tablet Commonly known as: Zofran ODT Take 1 tablet (4 mg total) by mouth every 8 (eight) hours as needed for nausea or vomiting.   PARoxetine 10 MG tablet Commonly known as: PAXIL Take 1 tablet (10 mg total) by mouth daily.   simvastatin 40 MG tablet Commonly known as: ZOCOR Take 1 tablet (40 mg total) by mouth daily at 6 PM. What changed:   medication strength  how much to take   Vitamin D-3 25 MCG (1000 UT) Caps Take 1,000 Units by mouth in the morning and at bedtime.       Follow-up Information    Regan Lemming, MD Follow up.   Specialty: Cardiology Why: 04/06/2021, discusse heart rhythm monitoring after your stroke Contact information: [address] [place] [address] [phone]        [facility]. Schedule an appointment as soon as possible for a visit in 4 week(s).   Specialty: Neurology Contact information: [address] [place] [address] [phone]       Myrlene Broker, MD Follow up.   Specialty: Internal Medicine Contact information: [address] [place] [address] [phone]              Allergies  Allergen Reactions  . Shellfish Allergy Nausea And Vomiting    Scallops and oysters  . Sulfonamide Derivatives Other (See Comments)    Reaction not recalled    Consultations:  Neurology   Procedures/Studies: CT ANGIO HEAD W OR WO CONTRAST  Result Date: 03/12/2021 CLINICAL DATA:  Acute neurologic deficit.  Aphasia. EXAM: CT ANGIOGRAPHY HEAD AND NECK TECHNIQUE: Multidetector CT imaging of the head and neck was  performed using the standard protocol during bolus administration of intravenous contrast. Multiplanar CT image reconstructions and MIPs were obtained to evaluate the vascular anatomy. Carotid stenosis measurements (when applicable) are obtained utilizing NASCET criteria, using the distal internal carotid diameter as the denominator. CONTRAST:  OMNIPAQUE IOHEXOL 350 MG/ML SOLN COMPARISON:  None. Head CT 03/12/2021 FINDINGS: CTA NECK FINDINGS SKELETON: Bone island at T1. OTHER NECK: Debris in the esophagus UPPER CHEST: No pneumothorax or pleural effusion. No nodules or masses. AORTIC ARCH: There is calcific atherosclerosis of the aortic arch. There is no aneurysm, dissection or hemodynamically significant stenosis of the visualized portion of the aorta. Conventional 3 vessel aortic branching pattern. The visualized proximal subclavian arteries are widely patent. RIGHT CAROTID SYSTEM: Normal without aneurysm, dissection or stenosis.  LEFT CAROTID SYSTEM: Normal without aneurysm, dissection or stenosis. VERTEBRAL ARTERIES: Left dominant configuration. Both origins are clearly patent. There is no dissection, occlusion or flow-limiting stenosis to the skull base (V1-V3 segments). CTA HEAD FINDINGS POSTERIOR CIRCULATION: --Vertebral arteries: Normal V4 segments. --Inferior cerebellar arteries: Normal. --Basilar artery: Normal. --Superior cerebellar arteries: Normal. --Posterior cerebral arteries (PCA): Normal. ANTERIOR CIRCULATION: --Intracranial internal carotid arteries: Normal. --Anterior cerebral arteries (ACA): Normal. Absent left A1 segment, normal variant --Middle cerebral arteries (MCA): Normal. VENOUS SINUSES: As permitted by contrast timing, patent. ANATOMIC VARIANTS: Absent left ACA A1 segment, a common variant. Review of the MIP images confirms the above findings. IMPRESSION: 1. No emergent large vessel occlusion or high-grade stenosis of the intracranial or cervical arteries. 2. Debris in the esophagus,  suggesting gastroesophageal reflux. Aortic Atherosclerosis (ICD10-I70.0). Electronically Signed   By: Deatra Robinson M.D.   On: 03/12/2021 20:17   CT HEAD WO CONTRAST  Result Date: 03/12/2021 CLINICAL DATA:  Acute neuro deficit.  Aphasia EXAM: CT HEAD WITHOUT CONTRAST TECHNIQUE: Contiguous axial images were obtained from the base of the skull through the vertex without intravenous contrast. COMPARISON:  MRI head 02/14/2012 FINDINGS: Brain: No evidence of acute infarction, hemorrhage, hydrocephalus, extra-axial collection or mass lesion/mass effect. Vascular: Negative for hyperdense vessel Skull: Negative Sinuses/Orbits: Negative Other: None IMPRESSION: Negative CT head Electronically Signed   By: Marlan Palau M.D.   On: 03/12/2021 19:33   CT ANGIO NECK W OR WO CONTRAST  Result Date: 03/12/2021 CLINICAL DATA:  Acute neurologic deficit.  Aphasia. EXAM: CT ANGIOGRAPHY HEAD AND NECK TECHNIQUE: Multidetector CT imaging of the head and neck was performed using the standard protocol during bolus administration of intravenous contrast. Multiplanar CT image reconstructions and MIPs were obtained to evaluate the vascular anatomy. Carotid stenosis measurements (when applicable) are obtained utilizing NASCET criteria, using the distal internal carotid diameter as the denominator. CONTRAST:  OMNIPAQUE IOHEXOL 350 MG/ML SOLN COMPARISON:  None. Head CT 03/12/2021 FINDINGS: CTA NECK FINDINGS SKELETON: Bone island at T1. OTHER NECK: Debris in the esophagus UPPER CHEST: No pneumothorax or pleural effusion. No nodules or masses. AORTIC ARCH: There is calcific atherosclerosis of the aortic arch. There is no aneurysm, dissection or hemodynamically significant stenosis of the visualized portion of the aorta. Conventional 3 vessel aortic branching pattern. The visualized proximal subclavian arteries are widely patent. RIGHT CAROTID SYSTEM: Normal without aneurysm, dissection or stenosis. LEFT CAROTID SYSTEM: Normal without  aneurysm, dissection or stenosis. VERTEBRAL ARTERIES: Left dominant configuration. Both origins are clearly patent. There is no dissection, occlusion or flow-limiting stenosis to the skull base (V1-V3 segments). CTA HEAD FINDINGS POSTERIOR CIRCULATION: --Vertebral arteries: Normal V4 segments. --Inferior cerebellar arteries: Normal. --Basilar artery: Normal. --Superior cerebellar arteries: Normal. --Posterior cerebral arteries (PCA): Normal. ANTERIOR CIRCULATION: --Intracranial internal carotid arteries: Normal. --Anterior cerebral arteries (ACA): Normal. Absent left A1 segment, normal variant --Middle cerebral arteries (MCA): Normal. VENOUS SINUSES: As permitted by contrast timing, patent. ANATOMIC VARIANTS: Absent left ACA A1 segment, a common variant. Review of the MIP images confirms the above findings. IMPRESSION: 1. No emergent large vessel occlusion or high-grade stenosis of the intracranial or cervical arteries. 2. Debris in the esophagus, suggesting gastroesophageal reflux. Aortic Atherosclerosis (ICD10-I70.0). Electronically Signed   By: Deatra Robinson M.D.   On: 03/12/2021 20:17   MR BRAIN WO CONTRAST  Result Date: 03/12/2021 CLINICAL DATA:  Acute aphasia EXAM: MRI HEAD WITHOUT CONTRAST TECHNIQUE: Multiplanar, multiecho pulse sequences of the brain and surrounding structures were obtained without intravenous contrast. COMPARISON:  02/14/2012  FINDINGS: Brain: Multiple areas of acute ischemia within the left parietal lobe, predominantly involving the cortex. No acute or chronic hemorrhage. Minimal multifocal hyperintense T2-weight signal within the white matter. The midline structures are normal. Vascular: Major flow voids are preserved. Skull and upper cervical spine: Normal calvarium and skull base. Visualized upper cervical spine and soft tissues are normal. Sinuses/Orbits:No paranasal sinus fluid levels or advanced mucosal thickening. No mastoid or middle ear effusion. Normal orbits. IMPRESSION:  Multiple areas of acute ischemia within the left parietal lobe, predominantly cortical. No hemorrhage or mass effect. Electronically Signed   By: Deatra Robinson M.D.   On: 03/12/2021 23:34   CT ABDOMEN PELVIS W CONTRAST  Result Date: 02/25/2021 CLINICAL DATA:  Right lower quadrant pain EXAM: CT ABDOMEN AND PELVIS WITH CONTRAST TECHNIQUE: Multidetector CT imaging of the abdomen and pelvis was performed using the standard protocol following bolus administration of intravenous contrast. CONTRAST:  OMNIPAQUE IOHEXOL 300 MG/ML  SOLN COMPARISON:  CT 03/05/2016 FINDINGS: Lower chest: Lung bases demonstrate no acute consolidation or effusion. Moderate hiatal hernia. Borderline cardiomegaly. Hepatobiliary: Subcentimeter hypodensities in the liver too small to further characterize. Punctate layering stones in the gallbladder. No biliary dilatation. Pancreas: Unremarkable. No pancreatic ductal dilatation or surrounding inflammatory changes. Spleen: Normal in size without focal abnormality. Adrenals/Urinary Tract: Adrenal glands are normal. Kidneys show no hydronephrosis. Small parapelvic cysts. Subcentimeter hypodensities too small to further characterize. 5 mm stone lower pole right kidney. The urinary bladder is unremarkable Stomach/Bowel: The stomach is nonenlarged. There is no dilated small bowel. Extensive diverticular disease of the colon. Mild inflammatory changes involving the proximal descending colon consistent with acute diverticulitis. Negative appendix Vascular/Lymphatic: Moderate aortic atherosclerosis without aneurysm. No suspicious nodes. Reproductive: Uterus and bilateral adnexa are unremarkable. Other: Negative for free air or free fluid. Musculoskeletal: Degenerative changes of the spine. No acute or suspicious osseous abnormality IMPRESSION: 1. Findings consistent with acute diverticulitis involving the proximal descending colon. No perforation or abscess. 2. Gallstones. 3. Nonobstructing stone in  the right kidney. 4. Moderate hiatal hernia. Aortic Atherosclerosis (ICD10-I70.0). Electronically Signed   By: Jasmine Pang M.D.   On: 02/25/2021 17:00   ECHOCARDIOGRAM COMPLETE BUBBLE STUDY  Result Date: 03/14/2021    ECHOCARDIOGRAM REPORT   Patient Name:   Tiffany House Date of Exam: 03/14/2021 Medical Rec #:  [id number]     Height:       60.0 in Accession #:    [id number]    Weight:       141.1 lb Date of Birth:  November 18, 1946     BSA:          1.609 m Patient Age:    75 years      BP:           119/62 mmHg Patient Gender: F             HR:           64 bpm. Exam Location:  Inpatient Procedure: 2D Echo, Cardiac Doppler and Color Doppler Indications:    Stroke  History:        Patient has no prior history of Echocardiogram examinations.                 Risk Factors:Hypertension. GERD.  Sonographer:    Ross Ludwig RDCS (AE) Referring Phys: [id number] Malachi Carl STACK IMPRESSIONS  1. Left ventricular ejection fraction, by estimation, is 55 to 60%. The left ventricle has normal function. The left ventricle has no regional wall  motion abnormalities. There is mild left ventricular hypertrophy. Left ventricular diastolic parameters are indeterminate.  2. Right ventricular systolic function is normal. The right ventricular size is normal.  3. Mild mitral valve regurgitation.  4. The aortic valve is abnormal. Aortic valve regurgitation is not visualized. Mild to moderate aortic valve sclerosis/calcification is present, without any evidence of aortic stenosis.  5. The inferior vena cava is normal in size with greater than 50% respiratory variability, suggesting right atrial pressure of 3 mmHg. FINDINGS  Left Ventricle: Left ventricular ejection fraction, by estimation, is 55 to 60%. The left ventricle has normal function. The left ventricle has no regional wall motion abnormalities. The left ventricular internal cavity size was normal in size. There is  mild left ventricular hypertrophy. Left ventricular diastolic parameters are  indeterminate. Right Ventricle: The right ventricular size is normal. Right vetricular wall thickness was not assessed. Right ventricular systolic function is normal. Left Atrium: Left atrial size was normal in size. Right Atrium: Right atrial size was normal in size. Pericardium: There is no evidence of pericardial effusion. Mitral Valve: There is mild thickening of the mitral valve leaflet(s). Mild mitral annular calcification. Mild mitral valve regurgitation. MV peak gradient, 4.6 mmHg. The mean mitral valve gradient is 2.0 mmHg. Tricuspid Valve: The tricuspid valve is normal in structure. Tricuspid valve regurgitation is trivial. Aortic Valve: The aortic valve is abnormal. Aortic valve regurgitation is not visualized. Mild to moderate aortic valve sclerosis/calcification is present, without any evidence of aortic stenosis. Aortic valve mean gradient measures 3.0 mmHg. Aortic valve peak gradient measures 4.8 mmHg. Aortic valve area, by VTI measures 2.19 cm. Pulmonic Valve: The pulmonic valve was normal in structure. Pulmonic valve regurgitation is not visualized. Aorta: The aortic root and ascending aorta are structurally normal, with no evidence of dilitation. Venous: The inferior vena cava is normal in size with greater than 50% respiratory variability, suggesting right atrial pressure of 3 mmHg. IAS/Shunts: No atrial level shunt detected by color flow Doppler.  LEFT VENTRICLE PLAX 2D LVIDd:         3.60 cm  Diastology LVIDs:         2.50 cm  LV e' medial:    6.20 cm/s LV PW:         1.20 cm  LV E/e' medial:  15.1 LV IVS:        1.10 cm  LV e' lateral:   7.62 cm/s LVOT diam:     1.80 cm  LV E/e' lateral: 12.3 LV SV:         54 LV SV Index:   34 LVOT Area:     2.54 cm  RIGHT VENTRICLE             IVC RV Basal diam:  2.50 cm     IVC diam: 1.30 cm RV S prime:     10.10 cm/s TAPSE (M-mode): 2.2 cm LEFT ATRIUM             Index       RIGHT ATRIUM          Index LA diam:        3.20 cm 1.99 cm/m  RA Area:     8.73  cm LA Vol (A2C):   31.6 ml 19.63 ml/m RA Volume:   16.80 ml 10.44 ml/m LA Vol (A4C):   22.7 ml 14.10 ml/m LA Biplane Vol: 27.9 ml 17.34 ml/m  AORTIC VALVE AV Area (Vmax):    2.57 cm AV Area (Vmean):  2.35 cm AV Area (VTI):     2.19 cm AV Vmax:           109.00 cm/s AV Vmean:          80.800 cm/s AV VTI:            0.249 m AV Peak Grad:      4.8 mmHg AV Mean Grad:      3.0 mmHg LVOT Vmax:         110.00 cm/s LVOT Vmean:        74.600 cm/s LVOT VTI:          0.214 m LVOT/AV VTI ratio: 0.86  AORTA Ao Root diam: 3.10 cm Ao Asc diam:  3.50 cm MITRAL VALVE MV Area (PHT): 2.56 cm     SHUNTS MV Area VTI:   1.86 cm     Systemic VTI:  0.21 m MV Peak grad:  4.6 mmHg     Systemic Diam: 1.80 cm MV Mean grad:  2.0 mmHg MV Vmax:       1.07 m/s MV Vmean:      60.9 cm/s MV Decel Time: 296 msec MV E velocity: 93.50 cm/s MV A velocity: 108.00 cm/s MV E/A ratio:  0.87 Dietrich Pates MD Electronically signed by Dietrich Pates MD Signature Date/Time: 03/14/2021/3:03:38 PM    Final    VAS [place] LOWER EXTREMITY VENOUS (DVT)  Result Date: 03/14/2021  Lower Venous DVT Study Indications: Embolic stroke.  Comparison Study: No prior studies. Performing Technologist: Jean Rosenthal RDMS,RVT  Examination Guidelines: A complete evaluation includes B-mode imaging, spectral Doppler, color Doppler, and power Doppler as needed of all accessible portions of each vessel. Bilateral testing is considered an integral part of a complete examination. Limited examinations for reoccurring indications may be performed as noted. The reflux portion of the exam is performed with the patient in reverse Trendelenburg.  +---------+---------------+---------+-----------+----------+--------------+ RIGHT    CompressibilityPhasicitySpontaneityPropertiesThrombus Aging +---------+---------------+---------+-----------+----------+--------------+ CFV      Full           Yes      Yes                                  +---------+---------------+---------+-----------+----------+--------------+ SFJ      Full                                                        +---------+---------------+---------+-----------+----------+--------------+ FV Prox  Full                                                        +---------+---------------+---------+-----------+----------+--------------+ FV Mid   Full                                                        +---------+---------------+---------+-----------+----------+--------------+ FV DistalFull                                                        +---------+---------------+---------+-----------+----------+--------------+  PFV      Full                                                        +---------+---------------+---------+-----------+----------+--------------+ POP      Full           Yes      Yes                                 +---------+---------------+---------+-----------+----------+--------------+ PTV      Full                                                        +---------+---------------+---------+-----------+----------+--------------+ PERO     Full                                                        +---------+---------------+---------+-----------+----------+--------------+   +---------+---------------+---------+-----------+----------+--------------+ LEFT     CompressibilityPhasicitySpontaneityPropertiesThrombus Aging +---------+---------------+---------+-----------+----------+--------------+ CFV      Full           Yes      Yes                                 +---------+---------------+---------+-----------+----------+--------------+ SFJ      Full                                                        +---------+---------------+---------+-----------+----------+--------------+ FV Prox  Full                                                         +---------+---------------+---------+-----------+----------+--------------+ FV Mid   Full                                                        +---------+---------------+---------+-----------+----------+--------------+ FV DistalFull                                                        +---------+---------------+---------+-----------+----------+--------------+ PFV      Full                                                        +---------+---------------+---------+-----------+----------+--------------+  POP      Full           Yes      Yes                                 +---------+---------------+---------+-----------+----------+--------------+ PTV      Full                                                        +---------+---------------+---------+-----------+----------+--------------+ PERO     Full                                                        +---------+---------------+---------+-----------+----------+--------------+     Summary: RIGHT: - There is no evidence of deep vein thrombosis in the lower extremity.  - No cystic structure found in the popliteal fossa.  LEFT: - There is no evidence of deep vein thrombosis in the lower extremity.  - No cystic structure found in the popliteal fossa.  *See table(s) above for measurements and observations. Electronically signed by Lemar Livings MD on 03/14/2021 at 9:39:04 AM.    Final        Subjective:   Discharge Exam: Vitals:   03/14/21 1213 03/14/21 1651  BP: (!) 156/78 126/70  Pulse: 85 89  Resp: 19 18  Temp: 97.6 F (36.4 C) 98.4 F (36.9 C)  SpO2: 98% 97%   Vitals:   03/13/21 2348 03/14/21 0425 03/14/21 1213 03/14/21 1651  BP: 123/67 119/62 (!) 156/78 126/70  Pulse: 71 64 85 89  Resp: Temp: (!) 97.5 F (36.4 C) 97.6 F (36.4 C) 97.6 F (36.4 C) 98.4 F (36.9 C)  TempSrc: Oral Oral Oral Oral  SpO2: 97% 98% 98% 97%  Weight:      Height:        General: Pt is alert, awake,  not in acute distress Cardiovascular: RRR, S1/S2 +, no rubs, no gallops Respiratory: CTA bilaterally, no wheezing, no rhonchi Abdominal: Soft, NT, ND, bowel sounds + Extremities: no edema, no cyanosis    The results of significant diagnostics from this hospitalization (including imaging, microbiology, ancillary and laboratory) are listed below for reference.     Microbiology: Recent Results (from the past 240 hour(s))  Resp Panel by RT-PCR (Flu A&B, Covid) Nasopharyngeal Swab     Status: None   Collection Time: 03/12/21  6:40 PM   Specimen: Nasopharyngeal Swab; Nasopharyngeal(NP) swabs in vial transport medium  Result Value Ref Range Status   SARS Coronavirus 2 by RT PCR NEGATIVE NEGATIVE Final    Comment: (NOTE) SARS-CoV-2 target nucleic acids are NOT DETECTED.  The SARS-CoV-2 RNA is generally detectable in upper respiratory specimens during the acute phase of infection. The lowest concentration of SARS-CoV-2 viral copies this assay can detect is 138 copies/mL. A negative result does not preclude SARS-Cov-2 infection and should not be used as the sole basis for treatment or other patient management decisions. A negative result may occur with  improper specimen collection/handling, submission of specimen other than nasopharyngeal swab, presence of viral mutation(s) within the areas targeted by this  assay, and inadequate number of viral copies(<138 copies/mL). A negative result must be combined with clinical observations, patient history, and epidemiological information. The expected result is Negative.  Fact Sheet for Patients:  BloggerCourse.com  Fact Sheet for Healthcare Providers:  SeriousBroker.it  This test is no t yet approved or cleared by the [place] FDA and  has been authorized for detection and/or diagnosis of SARS-CoV-2 by FDA under an Emergency Use Authorization (EUA). This EUA will remain  in effect  (meaning this test can be used) for the duration of the COVID-19 declaration under Section 564(b)(1) of the Act, 21 U.S.C.section 360bbb-3(b)(1), unless the authorization is terminated  or revoked sooner.       Influenza A by PCR NEGATIVE NEGATIVE Final   Influenza B by PCR NEGATIVE NEGATIVE Final    Comment: (NOTE) The Xpert Xpress SARS-CoV-2/FLU/RSV plus assay is intended as an aid in the diagnosis of influenza from Nasopharyngeal swab specimens and should not be used as a sole basis for treatment. Nasal washings and aspirates are unacceptable for Xpert Xpress SARS-CoV-2/FLU/RSV testing.  Fact Sheet for Patients: BloggerCourse.com  Fact Sheet for Healthcare Providers: SeriousBroker.it  This test is not yet approved or cleared by the [place] FDA and has been authorized for detection and/or diagnosis of SARS-CoV-2 by FDA under an Emergency Use Authorization (EUA). This EUA will remain in effect (meaning this test can be used) for the duration of the COVID-19 declaration under Section 564(b)(1) of the Act, 21 U.S.C. section 360bbb-3(b)(1), unless the authorization is terminated or revoked.  Performed at [facility], [address] [place], [place] [address]      Labs: BNP (last 3 results) No results for input(s): BNP in the last 8760 hours. Basic Metabolic Panel: Recent Labs  Lab 03/12/21 1900 03/12/21 1921  NA 139 142  K 3.4* 3.3*  CL 110 109  CO2 19*  --   GLUCOSE 81 82  BUN 12 12  CREATININE 1.13* 1.00  CALCIUM 9.3  --    Liver Function Tests: Recent Labs  Lab 03/12/21 1900  AST 27  ALT 17  ALKPHOS 47  BILITOT 0.4  PROT 6.2*  ALBUMIN 3.6   No results for input(s): LIPASE, AMYLASE in the last 168 hours. No results for input(s): AMMONIA in the last 168 hours. CBC: Recent Labs  Lab 03/12/21 1900 03/12/21 1921  WBC 9.0  --   NEUTROABS 4.3  --   HGB 10.8* 10.9*  HCT 34.6* 32.0*  MCV  88.3  --   PLT 381  --    Cardiac Enzymes: No results for input(s): CKTOTAL, CKMB, CKMBINDEX, TROPONINI in the last 168 hours. BNP: Invalid input(s): POCBNP CBG: No results for input(s): GLUCAP in the last 168 hours. D-Dimer No results for input(s): DDIMER in the last 72 hours. Hgb A1c Recent Labs    03/12/21 2036  HGBA1C 5.7*   Lipid Profile Recent Labs    03/12/21 2036  LDLDIRECT 71.3   Thyroid function studies No results for input(s): TSH, T4TOTAL, T3FREE, THYROIDAB in the last 72 hours.  Invalid input(s): FREET3 Anemia work up No results for input(s): VITAMINB12, FOLATE, FERRITIN, TIBC, IRON, RETICCTPCT in the last 72 hours. Urinalysis    Component Value Date/Time   COLORURINE YELLOW 03/12/2021 1840   APPEARANCEUR CLEAR 03/12/2021 1840   LABSPEC >1.046 (H) 03/12/2021 1840   PHURINE 6.0 03/12/2021 1840   GLUCOSEU NEGATIVE 03/12/2021 1840   HGBUR NEGATIVE 03/12/2021 1840   HGBUR large 01/09/2010 1105  BILIRUBINUR NEGATIVE 03/12/2021 1840   KETONESUR NEGATIVE 03/12/2021 1840   PROTEINUR NEGATIVE 03/12/2021 1840   UROBILINOGEN 0.2 02/20/2012 0208   NITRITE NEGATIVE 03/12/2021 1840   LEUKOCYTESUR MODERATE (A) 03/12/2021 1840   Sepsis Labs Invalid input(s): PROCALCITONIN,  WBC,  LACTICIDVEN Microbiology Recent Results (from the past 240 hour(s))  Resp Panel by RT-PCR (Flu A&B, Covid) Nasopharyngeal Swab     Status: None   Collection Time: 03/12/21  6:40 PM   Specimen: Nasopharyngeal Swab; Nasopharyngeal(NP) swabs in vial transport medium  Result Value Ref Range Status   SARS Coronavirus 2 by RT PCR NEGATIVE NEGATIVE Final    Comment: (NOTE) SARS-CoV-2 target nucleic acids are NOT DETECTED.  The SARS-CoV-2 RNA is generally detectable in upper respiratory specimens during the acute phase of infection. The lowest concentration of SARS-CoV-2 viral copies this assay can detect is 138 copies/mL. A negative result does not preclude SARS-Cov-2 infection and should  not be used as the sole basis for treatment or other patient management decisions. A negative result may occur with  improper specimen collection/handling, submission of specimen other than nasopharyngeal swab, presence of viral mutation(s) within the areas targeted by this assay, and inadequate number of viral copies(<138 copies/mL). A negative result must be combined with clinical observations, patient history, and epidemiological information. The expected result is Negative.  Fact Sheet for Patients:  BloggerCourse.com  Fact Sheet for Healthcare Providers:  SeriousBroker.it  This test is no t yet approved or cleared by the [place] FDA and  has been authorized for detection and/or diagnosis of SARS-CoV-2 by FDA under an Emergency Use Authorization (EUA). This EUA will remain  in effect (meaning this test can be used) for the duration of the COVID-19 declaration under Section 564(b)(1) of the Act, 21 U.S.C.section 360bbb-3(b)(1), unless the authorization is terminated  or revoked sooner.       Influenza A by PCR NEGATIVE NEGATIVE Final   Influenza B by PCR NEGATIVE NEGATIVE Final    Comment: (NOTE) The Xpert Xpress SARS-CoV-2/FLU/RSV plus assay is intended as an aid in the diagnosis of influenza from Nasopharyngeal swab specimens and should not be used as a sole basis for treatment. Nasal washings and aspirates are unacceptable for Xpert Xpress SARS-CoV-2/FLU/RSV testing.  Fact Sheet for Patients: BloggerCourse.com  Fact Sheet for Healthcare Providers: SeriousBroker.it  This test is not yet approved or cleared by the [place] FDA and has been authorized for detection and/or diagnosis of SARS-CoV-2 by FDA under an Emergency Use Authorization (EUA). This EUA will remain in effect (meaning this test can be used) for the duration of the COVID-19 declaration under  Section 564(b)(1) of the Act, 21 U.S.C. section 360bbb-3(b)(1), unless the authorization is terminated or revoked.  Performed at [facility], [address] [place], [place] [address]      Time coordinating discharge: Over 30 minutes  SIGNED:   Reva Bores, MD  [facility] 03/14/2021, 5:44 PM  If 7PM-7AM, please contact night-coverage

## 2021-03-14 NOTE — Evaluation (Signed)
Occupational Therapy Evaluation Patient Details Name: Tiffany House MRN: [id number] DOB: 03-04-1946 Today's Date: 03/14/2021    History of Present Illness Pt presented to ED on 4/14 with aphasia. MRI showed multiple areas of acute ischemia within the left parietal lobe. PMH - arthritis, HTN, bppv   Clinical Impression   PTA patient independent and driving. Admitted for above and presenting with problem list below, including impaired cognition and communication.  She is able to complete basic ADLs with independence, but when presented with simple trail making task patient demonstrated with decreased recall, attention, sequencing, and processing. With writing, patient able to initially write without errors but as progressed (ie writing name/address) patient with decreased attention and processing therefore unable to complete written thought.  Patient voiced understanding with recommendations of no driving at this time, assist with meds and IADLs.  Based on performance today, recommend continued OT services while admitted and after dc at outpatient OT (neuro) to return to independence with higher level cognition and IADLs.     Follow Up Recommendations  Outpatient OT;Supervision - Intermittent    Equipment Recommendations  None recommended by OT    Recommendations for Other Services Speech consult     Precautions / Restrictions Precautions Precautions: None Restrictions Weight Bearing Restrictions: No      Mobility Bed Mobility Overal bed mobility: Independent                  Transfers Overall transfer level: Independent Equipment used: None                  Balance Overall balance assessment: Independent                                         ADL either performed or assessed with clinical judgement   ADL Overall ADL's : Modified independent                                       General ADL Comments: able to complete basic  ADLs with modified independence.     Vision Baseline Vision/History: Wears glasses Wears Glasses: At all times Patient Visual Report: No change from baseline Vision Assessment?: No apparent visual deficits     Perception     Praxis      Pertinent Vitals/Pain Pain Assessment: 0-10 Pain Score: 3  Pain Location: HA Pain Descriptors / Indicators: Headache Pain Intervention(s): Monitored during session     Hand Dominance Right   Extremity/Trunk Assessment Upper Extremity Assessment Upper Extremity Assessment: Overall WFL for tasks assessed   Lower Extremity Assessment Lower Extremity Assessment: Overall WFL for tasks assessed       Communication Communication Communication: Expressive difficulties   Cognition Arousal/Alertness: Awake/alert Behavior During Therapy: WFL for tasks assessed/performed Overall Cognitive Status: Impaired/Different from baseline Area of Impairment: Attention;Memory;Awareness;Problem solving                   Current Attention Level: Sustained Memory: Decreased short-term memory     Awareness: Emergent Problem Solving: Slow processing;Difficulty sequencing;Requires verbal cues General Comments: patient presenting with difficulty attending to task, recalling, and processing through trail making task (1 step finding a room number) requrinig up to mod cueing to utilize signs in hallway and increased time.  She has some word finding difficulites and when writing  looses attention/fatigues after writing a few words.   General Comments  discussed safety with IADLs, pt agreeable to assist for driving, meds and cooking as of now.. reports her brother/friend maybe able to assist    Exercises     Shoulder Instructions      Home Living Family/patient expects to be discharged to:: Private residence Living Arrangements: Alone   Type of Home: House Home Access: Stairs to enter   Entrance Stairs-Rails: Right Home Layout: One level      Bathroom Shower/Tub: Chief Strategy Officer: Handicapped height     Home Equipment: Tub bench;Grab bars - tub/shower;Shower seat          Prior Functioning/Environment Level of Independence: Independent        Comments: driving, independent IADLs and med mgmt        OT Problem List: Decreased cognition;Decreased knowledge of precautions      OT Treatment/Interventions: Self-care/ADL training;Cognitive remediation/compensation;Therapeutic activities    OT Goals(Current goals can be found in the care plan section) Acute Rehab OT Goals Patient Stated Goal: to get my independence back OT Goal Formulation: With patient Time For Goal Achievement: 03/28/21 Potential to Achieve Goals: Good  OT Frequency: Min 2X/week   Barriers to D/C:            Co-evaluation              AM-PAC OT "6 Clicks" Daily Activity     Outcome Measure Help from another person eating meals?: None Help from another person taking care of personal grooming?: None Help from another person toileting, which includes using toliet, bedpan, or urinal?: None Help from another person bathing (including washing, rinsing, drying)?: None Help from another person to put on and taking off regular upper body clothing?: None Help from another person to put on and taking off regular lower body clothing?: None 6 Click Score: 24   End of Session Nurse Communication: Mobility status  Activity Tolerance: Patient tolerated treatment well Patient left: with call bell/phone within reach;with bed alarm set;Other (comment) (seated EOB)  OT Visit Diagnosis: Other symptoms and signs involving cognitive function;Cognitive communication deficit (R41.841) Symptoms and signs involving cognitive functions: Cerebral infarction                Time: 3086-5784 OT Time Calculation (min): 30 min Charges:  OT General Charges $OT Visit: 1 Visit OT Evaluation $OT Eval Moderate Complexity: 1 Mod OT  Treatments $Self Care/Home Management : 8-22 mins  Barry Brunner, OT Acute Rehabilitation Services Pager [phone] Office [phone]   Chancy Milroy 03/14/2021, 9:36 AM

## 2021-03-14 NOTE — Progress Notes (Signed)
SLP Cancellation Note  Patient Details Name: Tiffany House MRN: [id number] DOB: 1946/02/03   Cancelled treatment:        Attempted to see pt for cognitive-linguist evaluation.  Pt unavailable at time of attempt for bubble study with echo.  Will re-attempt as SLP schedule permits.    Kerrie Pleasure, MA, CCC-SLP Acute Rehabilitation Services Office: [phone]; Pager [phone]): [phone] 03/14/2021, 1:42 PM

## 2021-03-14 NOTE — Progress Notes (Signed)
  Echocardiogram 2D Echocardiogram has been performed.  Gerda Diss 03/14/2021, 2:09 PM

## 2021-03-14 NOTE — Discharge Instructions (Signed)
Cognitive Rehabilitation After a Stroke After a stroke, you may have various problems with thinking (cognitive disability). The types of problems you have will depend on how severe the stroke was and where it was located in the brain. Problems may include:  Problems with short-term memory.  Trouble paying attention.  Trouble communicating or understanding language (aphasia).  A drop in mental ability that may interfere with daily life (dementia).  Trouble with problem-solving and information processing.  Problems with reading, writing, or math.  Problems with your ability to plan and to perform activities in sequence (executive function). These problems can feel overwhelming. However, with rehabilitation and time to heal, many people have improvement in their symptoms. What causes cognitive disability? A stroke happens when blood cannot flow to certain areas of the brain. When this happens, brain cells die in the affected areas because they cannot get oxygen and nutrients from the blood. Cognitive disability is caused by the death of cells in the areas of the brain that control thinking. What is cognitive rehabilitation? Cognitive rehabilitation is a program to help you improve your thinking skills after a stroke. Rehabilitation cannot completely reverse the effects of a stroke, but it can help you with memory, problem-solving, and communication skills. Therapy focuses on:  Improving brain function. This may involve activities such as learning to break down tasks into simple steps.  Helping you learn ways to cope with thinking problems. For example, you might learn memory tricks or do activities that stimulate memory, such as naming objects or describing pictures. Cognitive rehabilitation may include:  Speech-language therapy to help you understand and use language to communicate.  Occupational therapy to help you perform daily activities.  Music therapy to help relieve stress,  anxiety, and depression. This may involve listening to music, singing, or playing instruments.  Physical therapy to help improve your ability to move and perform actions that involve the muscles (motor functions).   When will therapy start and where will I have therapy? Your health care provider will decide when it is best for you to start therapy. In some cases, people start rehabilitation as soon as their health is stable, which may be 24-48 hours after the stroke. Rehabilitation can take place in a few different places, based on your needs. It may take place in:  The hospital or an in-patient rehabilitation hospital.  An outpatient rehabilitation facility.  A long-term care facility.  A community rehabilitation clinic.  Your home. What are some tools to help after a stroke? There are a number of tools and apps that you can use on your smartphone, personal computer, or tablet to help improve brain function. Some of these apps include:  Calendar reminders or alarm apps to help with memory.  Note-taking or sketch pad apps to help with memory or communication.  Text-to-speech apps that allow you to listen to what you are reading, which helps your ability to understanding text.  Picture dictionary or picture message apps to help with communication.  E-readers. These can highlight text as it is read aloud, which helps with listening and reading skills. How can my friends or family help during my rehabilitation? During your recovery, it is important that your friends and family members help you work toward more independence. Your caregivers should speak with your health care providers to learn how they can best help you during recovery. This may include working on speech-language or memory exercises at home, or helping with daily tasks and errands. If you have cognitive disability,  you may be at risk for injury or accidents at home, such as forgetting to turn off the stove. Friends and  family members can help ensure home safety by taking steps such as getting appliances with automatic shut-off features or storing dangerous objects in a secure place. What else should I know about cognitive rehabilitation after a stroke? Having trouble with memory and problem-solving can make you feel alone. You may also have mood changes, anxiety, or depression after a stroke. It is important to:  Stay connected with others through social groups, online support groups, or your community.  Talk to your friends, family, and caregivers about any emotional problems you are having.  Go to one-on-one or group therapy as suggested by your health care provider.  Stay physically active and exercise as often as suggested by your health care provider. Summary  After a stroke, some people have problems with thinking that affect attention, memory, language, communication, and problem-solving.  Cognitive rehabilitation is a program to help you regain brain function and learn skills to cope with thinking problems.  Rehabilitation cannot completely reverse the effects of a stroke, but it can help to improve quality of life.  Cognitive rehabilitation may include speech-language therapy, occupational therapy, music therapy, and physical therapy. This information is not intended to replace advice given to you by your health care provider. Make sure you discuss any questions you have with your health care provider. Document Revised: 03/07/2019 Document Reviewed: 02/18/2017 Elsevier Patient Education  2021 Elsevier Inc.  

## 2021-04-01 ENCOUNTER — Encounter: Payer: Self-pay | Admitting: *Deleted

## 2021-04-01 ENCOUNTER — Other Ambulatory Visit: Payer: Self-pay | Admitting: *Deleted

## 2021-04-01 NOTE — Patient Outreach (Signed)
[facility]) Care Management  04/01/2021  Tiffany House 08-13-1946 [id number]   [facility] outreach for EMMI stroke On APL discharge from [place]  RED ON EMMI ALERT Day #   13        Date: Sunday 03/29/21 1000 Red Alert Reason: Had follow-up appointment? No  Insurance: Paramedic  [facility] admissions x 1  ED visits x 1  in the last 6 months  [facility]re[facility] (CVA)- acute ischemia left parietal lobe hypothyroidism, aphasia,  03/12/21-03/14/21   Transition of care services noted to be completed by primary care MD office staff- Corinda Gubler at [facility] - Dr Hillard Danker  Transition of Care will be completed by primary care provider office who will refer to [facility] care management if needed.   Outreach attempt # 1 Patient is able to verify HIPAA identifiers [facility] Care Management RN reviewed and addressed red alert with patient  Consent: [facility] RN CM reviewed [facility] services with patient. Patient gave verbal consent for services [facility] telephonic RN CM.   Advised patient that there will be further automated EMMI- post discharge calls to assess how the patient is doing following the recent hospitalization Advised the patient that another call may be received from a nurse if any of their responses were abnormal. Patient voiced understanding and was appreciative of f/u call.    EMMI:  This EMMI answer documented it partially correct Mrs Tiffany House confirms she has appointments with her cardiologist on 04/06/21 and neurology on 04/21/21 but has not made an appointment with her pcp Dr Okey Dupre as she had just been seen by Dr Okey Dupre on 03/09/21 for diverticulosis prior to her 03/12/21 admission  She voices the importance of wanting to see her specialist providers before returning to the pcp She denies other medical concerns She reports doing very well at home and has returned to completing her norma home activities to include cooking, gardening, Activities of  daily living (ADLs) etc Patient Active Problem List   Diagnosis Date Noted  . CVA (cerebral vascular accident) (HCC) 03/14/2021  . Aphasia 03/12/2021  . Cerebrovascular accident (CVA) (HCC) 03/12/2021  . Diverticulitis 03/10/2021  . Diarrhea 03/10/2021  . Degenerative arthritis of right knee 12/02/2020  . Routine general medical examination at a health care facility 12/08/2018  . Bilateral impacted cerumen 05/03/2018  . Presbycusis of both ears 05/03/2018  . Adjustment disorder 06/05/2015  . Primary localized osteoarthrosis, lower leg 01/09/2014  . Hypothyroidism 11/02/2010  . Osteoporosis   . Hyperlipidemia 12/23/2008  . GERD 05/21/2008   Past Medical History:  Diagnosis Date  . Allergic rhinitis, cause unspecified   . Arthritis   . Benign positional vertigo   . GERD   . HYPERLIPIDEMIA   . Hypertension   . HYPOTHYROIDISM dx 10/2010  . Low back pain    MRI 02/2011  . Migraines   . OSTEOPENIA   . Osteoporosis    Qualifier: Diagnosis of  By: Nena Jordan   . RENAL CALCULUS, RIGHT    L 01/2012      Plan: Patient agrees to the care plan and follow up within the next 30 business days Pt encouraged to return a call to [facility] RN CM prn Sent welcome letter and letter to pcp  Goals Addressed              This Visit's Progress     Patient Stated   .  [facility]) Keep or Improve My Strength-Stroke (pt-stated)  On track     Timeframe:  Short-Term Goal Priority:  Medium Start Date:              04/01/21               Expected End Date:         05/28/21              Follow Up Date 05/07/21   - eat healthy to increase strength - increase activity or exercise time a little every week      Notes:  05/07/21 reports she has returned to  completing her norma home activities to include cooking, gardening, Activities of daily living (ADLs) etc    .  [facility]) Make and Keep All Appointments (pt-stated)   On track     Timeframe:  Short-Term Goal Priority:  Medium Start Date:     04/01/21                         Expected End Date:  05/28/21                     Follow Up Date 05/07/21   - keep a calendar with appointment dates     Notes:  05/07/21 made all appointments and pending pcp appointment until after seen by cardiology and neurology Last seen by pcp on 03/09/21 prior to 03/12/21 admission        Tiffany House L. Noelle Penner, RN, BSN, CCM [facility] Telephonic Care Management Care Coordinator Office number [phone] Mobile number [phone]  Main [facility] number [phone] Fax number [phone]

## 2021-04-06 ENCOUNTER — Encounter: Payer: Self-pay | Admitting: Cardiology

## 2021-04-06 ENCOUNTER — Ambulatory Visit: Payer: Medicare HMO | Admitting: Cardiology

## 2021-04-06 ENCOUNTER — Other Ambulatory Visit: Payer: Self-pay

## 2021-04-06 VITALS — BP 122/64 | HR 98 | Ht 59.0 in | Wt 141.0 lb

## 2021-04-06 DIAGNOSIS — I639 Cerebral infarction, unspecified: Secondary | ICD-10-CM

## 2021-04-06 NOTE — Progress Notes (Signed)
Electrophysiology Office Note   Date:  04/06/2021   ID:  Tiffany House, Tiffany House Apr 04, 1946, MRN [id number]  PCP:  Myrlene Broker, MD  Cardiologist:   Primary Electrophysiologist:  Crystelle Ferrufino Jorja Loa, MD    Chief Complaint: Cryptogenic stroke   History of Present Illness: Tiffany House is a 75 y.o. female who is being seen today for the evaluation of cryptogenic stroke at the request of Marvel Plan. Presenting today for electrophysiology evaluation.  She has a history of migraine headaches, hypertension, hypothyroidism, hyperlipidemia, BPV she presented to the emergency room 03/12/2021 with word finding difficulty.  MRI of the brain showed an acute left parietal lobe stroke.  Today, she denies symptoms of palpitations, chest pain, shortness of breath, orthopnea, PND, lower extremity edema, claudication, dizziness, presyncope, syncope, bleeding. The patient is tolerating medications without difficulties.  She continues to have issues with her speech.  She can read well, but has issues with numbers.  Aside from that, she has no strength or sensory issues from her stroke.  She feels that she has recovered well.  She is excited to start with speech therapy.   Past Medical History:  Diagnosis Date  . Allergic rhinitis, cause unspecified   . Arthritis   . Benign positional vertigo   . GERD   . HYPERLIPIDEMIA   . Hypertension   . HYPOTHYROIDISM dx 10/2010  . Low back pain    MRI 02/2011  . Migraines   . OSTEOPENIA   . Osteoporosis    Qualifier: Diagnosis of  By: Nena Jordan   . RENAL CALCULUS, RIGHT    L 01/2012   Past Surgical History:  Procedure Laterality Date  . HAND SURGERY  2001   Dr Teressa Senter-- surgery of right hand to rebuild joint  . HERNIA REPAIR  03/2016  . Kidney stone removed  01/12/10  . TUBAL LIGATION  1979     Current Outpatient Medications  Medication Sig Dispense Refill  . acetaminophen (TYLENOL) 325 MG tablet Take 650 mg by mouth See admin  instructions. Take 650 mg by mouth at bedtime and an additional 650 mg every 6 hours as needed for pain    . aspirin 81 MG chewable tablet Chew 1 tablet (81 mg total) by mouth daily. 90 tablet 1  . calcium carbonate (OS-CAL - DOSED IN MG OF ELEMENTAL CALCIUM) 1250 (500 Ca) MG tablet Take 1 tablet by mouth 2 (two) times daily with a meal.    . Cholecalciferol (VITAMIN D-3) 1000 UNITS CAPS Take 1,000 Units by mouth in the morning and at bedtime.    . diclofenac sodium (VOLTAREN) 1 % GEL Apply 2 g topically 4 (four) times daily. (Patient taking differently: Apply 2 g topically 4 (four) times daily as needed (for back or knee pain).) 100 g 3  . dicyclomine (BENTYL) 20 MG tablet Take 1 tablet (20 mg total) by mouth 2 (two) times daily as needed for spasms (abdominal paoin). 20 tablet 0  . fish oil-omega-3 fatty acids 1000 MG capsule Take 1 g by mouth every evening.    Marland Kitchen levothyroxine (SYNTHROID) 25 MCG tablet Take 1 tablet (25 mcg total) by mouth daily. 90 tablet 3  . meloxicam (MOBIC) 7.5 MG tablet Take 7.5-15 mg by mouth daily as needed for pain.    . Multiple Vitamin (MULTIVITAMIN) tablet Take 1 tablet by mouth daily.    . multivitamin-lutein (OCUVITE-LUTEIN) CAPS capsule Take 1 capsule by mouth daily.    Marland Kitchen omeprazole (PRILOSEC) 40  MG capsule Take 1 capsule (40 mg total) by mouth daily. 90 capsule 3  . ondansetron (ZOFRAN ODT) 4 MG disintegrating tablet Take 1 tablet (4 mg total) by mouth every 8 (eight) hours as needed for nausea or vomiting. 20 tablet 0  . PARoxetine (PAXIL) 10 MG tablet Take 1 tablet (10 mg total) by mouth daily. 90 tablet 3  . simvastatin (ZOCOR) 40 MG tablet Take 1 tablet (40 mg total) by mouth daily at 6 PM. 90 tablet 1   No current facility-administered medications for this visit.    Allergies:   Shellfish allergy and Sulfonamide derivatives   Social History:  The patient  reports that she has never smoked. She has never used smokeless tobacco. She reports current alcohol  use. She reports that she does not use drugs.   Family History:  The patient's family history includes Cancer in her father; Coronary artery disease in her father; Hyperlipidemia in her mother; Hypertension in her father and mother.    ROS:  Please see the history of present illness.   Otherwise, review of systems is positive for none.   All other systems are reviewed and negative.    PHYSICAL EXAM: VS:  There were no vitals taken for this visit. , BMI There is no height or weight on file to calculate BMI. GEN: Well nourished, well developed, in no acute distress  HEENT: normal  Neck: no JVD, carotid bruits, or masses Cardiac: RRR; no murmurs, rubs, or gallops,no edema  Respiratory:  clear to auscultation bilaterally, normal work of breathing GI: soft, nontender, nondistended, + BS MS: no deformity or atrophy  Skin: warm and dry Neuro:  Strength and sensation are intact Psych: euthymic mood, full affect  EKG:  EKG is not ordered today. Personal review of the ekg ordered 03/12/21 shows sinus rhythm, rate 75  Recent Labs: 05/07/2020: TSH 4.84 03/12/2021: ALT 17; BUN 12; Creatinine, Ser 1.00; Hemoglobin 10.9; Platelets 381; Potassium 3.3; Sodium 142    Lipid Panel     Component Value Date/Time   CHOL 192 05/07/2020 0927   TRIG 178.0 (H) 05/07/2020 0927   HDL 48.20 05/07/2020 0927   CHOLHDL 4 05/07/2020 0927   VLDL 35.6 05/07/2020 0927   LDLCALC 108 (H) 05/07/2020 0927   LDLDIRECT 71.3 03/12/2021 2036     Wt Readings from Last 3 Encounters:  03/12/21 141 lb 1.5 oz (64 kg)  03/09/21 141 lb (64 kg)  01/28/21 145 lb (65.8 kg)      Other studies Reviewed: Additional studies/ records that were reviewed today include: TTE 03/14/21  Review of the above records today demonstrates:  1. Left ventricular ejection fraction, by estimation, is 55 to 60%. The  left ventricle has normal function. The left ventricle has no regional  wall motion abnormalities. There is mild left  ventricular hypertrophy.  Left ventricular diastolic parameters  are indeterminate.  2. Right ventricular systolic function is normal. The right ventricular  size is normal.  3. Mild mitral valve regurgitation.  4. The aortic valve is abnormal. Aortic valve regurgitation is not  visualized. Mild to moderate aortic valve sclerosis/calcification is  present, without any evidence of aortic stenosis.  5. The inferior vena cava is normal in size with greater than 50%  respiratory variability, suggesting right atrial pressure of 3 mmHg.    ASSESSMENT AND PLAN:  1.  Cryptogenic stroke: Acute ischemia in the left parietal lobe.  Work-up thus far has been negative.  She has a normal ejection fraction.  She would thus benefit from Linq monitor implant for monitoring of atrial fibrillation.  Risks and benefits have been discussed.  Risk include bleeding and infection.  The patient understands these risks and is agreed to the procedure.  2.  Hypertension: Currently well controlled  3.  Hyperlipidemia: Continue statin per neurology and primary care.    Current medicines are reviewed at length with the patient today.   The patient does not have concerns regarding her medicines.  The following changes were made today:  none  Labs/ tests ordered today include:  No orders of the defined types were placed in this encounter.    Disposition:   FU with Alyson Ki pending link monitor results months  Signed, Tyrin Herbers Jorja Loa, MD  04/06/2021 8:18 AM     [facility] [address] [place] [address] [phone] (office) [phone] (fax)  SURGEON:  Loman Brooklyn, MD     PREPROCEDURE DIAGNOSIS:  Cryptogenic Stroke    POSTPROCEDURE DIAGNOSIS:  Cryptogenic Stroke     PROCEDURES:   1. Implantable loop recorder implantation    INTRODUCTION:  Tiffany House is a 75 y.o. female with a history of unexplained stroke who presents today for implantable loop  implantation.  The patient has had a cryptogenic stroke.  Despite an extensive workup by neurology, no reversible causes have been identified.  she has worn telemetry during which she did not have arrhythmias.  There is significant concern for possible atrial fibrillation as the cause for the patients stroke.  The patient therefore presents today for implantable loop implantation.     DESCRIPTION OF PROCEDURE:  Informed written consent was obtained, and the patient was brought to the electrophysiology lab in a fasting state.  The patient required no sedation for the procedure today.  Mapping over the patient's chest was performed by the EP lab staff to identify the area where electrograms were most prominent for ILR recording.  This area was found to be the left parasternal region over the 3rd-4th intercostal space. The patients left chest was therefore prepped and draped in the usual sterile fashion by the EP lab staff. The skin overlying the left parasternal region was infiltrated with lidocaine for local analgesia.  A 0.5-cm incision was made over the left parasternal region over the 3rd intercostal space.  A subcutaneous ILR pocket was fashioned using a combination of sharp and blunt dissection.  A Medtronic Reveal Linq model [place] [id number] implantable loop recorder was then placed into the pocket  R waves were very prominent and measured 0.53mV. EBL<1 ml.  Steri- Strips and a sterile dressing were then applied.  There were no early apparent complications.     CONCLUSIONS:   1. Successful implantation of a Medtronic Reveal LINQ implantable loop recorder for cryptogenic stroke  2. No early apparent complications.

## 2021-04-06 NOTE — Patient Instructions (Signed)
Medication Instructions:  Your physician recommends that you continue on your current medications as directed. Please refer to the Current Medication list given to you today.  *If you need a refill on your cardiac medications before your next appointment, please call your pharmacy*   Lab Work: None ordered   Testing/Procedures: None ordered   Follow-Up: At [facility], you and your health needs are our priority.  As part of our continuing mission to provide you with exceptional heart care, we have created designated Provider Care Teams.  These Care Teams include your primary Cardiologist (physician) and Advanced Practice Providers (APPs -  Physician Assistants and Nurse Practitioners) who all work together to provide you with the care you need, when you need it.  Your next appointment:   as needed  The format for your next appointment:   In Person  Provider:   Loman Brooklyn, MD    Thank you for choosing [facility]!!   Dory Horn, RN [phone]   Other Instructions  You had a loop recorder implanted today for stroke.   Implantable Loop Recorder Placement  An implantable loop recorder is a small electronic device that is placed under the skin of your chest. The device records the electrical activity of your heart over a long period of time. Your health care provider can download these recordings to monitor your heart. You may need an implantable loop recorder if you have periods of abnormal heart activity (arrhythmias) or unexplained fainting (syncope). The recorder can be left in place for 1 year or longer. Tell a health care provider about:  Any allergies you have.  All medicines you are taking, including vitamins, herbs, eye drops, creams, and over-the-counter medicines.  Any problems you or family members have had with anesthetic medicines.  Any blood disorders you have.  Any surgeries you have had.  Any medical conditions you have.  Whether you  are pregnant or may be pregnant. What are the risks? Generally, this is a safe procedure. However, problems may occur, including:  Infection.  Bleeding.  Allergic reactions to anesthetic medicines.  Damage to nerves or blood vessels.  Failure of the device to work. This could require another surgery to replace it. What happens before the procedure?  You may have a physical exam, blood tests, and imaging tests of your heart, such as a chest X-ray.  Follow instructions from your health care provider about eating or drinking restrictions.  Ask your health care provider about: ? Changing or stopping your regular medicines. This is especially important if you are taking diabetes medicines or blood thinners. ? Taking medicines such as aspirin and ibuprofen. These medicines can thin your blood. Do not take these medicines unless your health care provider tells you to take them. ? Taking over-the-counter medicines, vitamins, herbs, and supplements.  Ask your health care provider how your surgical site will be marked or identified.  Ask your health care provider what steps will be taken to help prevent infection. These may include: ? Removing hair at the surgery site. ? Washing skin with a germ-killing soap.  Plan to have someone take you home from the hospital or clinic.  Plan to have a responsible adult care for you for at least 24 hours after you leave the hospital or clinic. This is important.  Do not use any products that contain nicotine or tobacco, such as cigarettes and e-cigarettes. If you need help quitting, ask your health care provider.   What happens during the procedure?  An IV will be inserted into one of your veins.  You may be given one or more of the following: ? A medicine to help you relax (sedative). ? A medicine to numb the area (local anesthetic).  A small incision will be made on the left side of your upper chest.  A pocket will be created under your  skin.  The device will be placed in the pocket.  The incision will be closed with stitches (sutures) or adhesive strips.  A bandage (dressing) will be placed over the incision. The procedure may vary among health care providers and hospitals. What happens after the procedure?  Your blood pressure, heart rate, breathing rate, and blood oxygen level will be monitored until you leave the hospital or clinic.  You may be able to go home on the day of your surgery. Before you go home: ? Your health care provider will program your recorder. ? You will learn how to trigger your device with a handheld activator. ? You will learn how to send recordings to your health care provider. ? You will get an ID card for your device, and you will be told when to use it.  Do not drive for 24 hours if you were given a sedative during your procedure. Summary  An implantable loop recorder is a small electronic device that is placed under the skin of your chest to monitor your heart over a long period of time.  The recorder can be left in place for 1 year or longer.  Plan to have someone take you home from the hospital or clinic. This information is not intended to replace advice given to you by your health care provider. Make sure you discuss any questions you have with your health care provider. Document Revised: 08/05/2020 Document Reviewed: 12/31/2017 Elsevier Patient Education  2021 [employer].

## 2021-04-13 ENCOUNTER — Ambulatory Visit: Payer: Medicare HMO | Admitting: Speech Pathology

## 2021-04-15 ENCOUNTER — Ambulatory Visit: Payer: Medicare HMO | Admitting: Speech Pathology

## 2021-04-16 ENCOUNTER — Other Ambulatory Visit: Payer: Self-pay

## 2021-04-16 ENCOUNTER — Encounter: Payer: Self-pay | Admitting: Speech Pathology

## 2021-04-16 ENCOUNTER — Ambulatory Visit: Payer: Medicare HMO | Attending: Family Medicine | Admitting: Speech Pathology

## 2021-04-16 DIAGNOSIS — R4701 Aphasia: Secondary | ICD-10-CM

## 2021-04-16 NOTE — Addendum Note (Signed)
Addended by: Dorena Bodo on: 04/16/2021 02:36 PM   Modules accepted: Orders

## 2021-04-16 NOTE — Therapy (Signed)
[facility] [address] [place], [place], [address] Phone: [phone]   Fax:  [phone]  Speech Language Pathology Evaluation  Patient Details  Name: Tiffany House MRN: [id number] Date of Birth: December 20, 1945 Referring Provider (SLP): Reva Bores   Encounter Date: 04/16/2021   End of Session - 04/16/21 1425    Visit Number 1    Number of Visits 4    Date for SLP Re-Evaluation 05/17/21    SLP Start Time 1345    SLP Stop Time  1430    SLP Time Calculation (min) 45 min    Activity Tolerance Patient tolerated treatment well           Past Medical History:  Diagnosis Date  . Allergic rhinitis, cause unspecified   . Arthritis   . Benign positional vertigo   . GERD   . HYPERLIPIDEMIA   . Hypertension   . HYPOTHYROIDISM dx 10/2010  . Low back pain    MRI 02/2011  . Migraines   . OSTEOPENIA   . Osteoporosis    Qualifier: Diagnosis of  By: Nena Jordan   . RENAL CALCULUS, RIGHT    L 01/2012    Past Surgical History:  Procedure Laterality Date  . HAND SURGERY  2001   Dr Teressa Senter-- surgery of right hand to rebuild joint  . HERNIA REPAIR  03/2016  . Kidney stone removed  01/12/10  . TUBAL LIGATION  1979    There were no vitals filed for this visit.   Subjective Assessment - 04/16/21 1351    Subjective Pt reports she has some trouble word finding. She reported she received her 2nd booster shot yesterday.    Currently in Pain? No/denies              SLP Evaluation [facility] - 04/16/21 1352      SLP Visit Information   SLP Received On 04/16/21    Referring Provider (SLP) Tinnie Gens S    Onset Date 03/13/21    Medical Diagnosis LCVA      Subjective   Patient/Family Stated Goal Writing and word finding      Balance Screen   Has the patient fallen in the past 6 months No    Has the patient had a decrease in activity level because of a fear of falling?  No    Is the patient reluctant to leave their home  because of a fear of falling?  No      Prior Functional Status   Cognitive/Linguistic Baseline Baseline deficits    Type of Home House     Lives With Alone    Available Support Friend(s);Family    Vocation Retired      Copy Status Within Functional Limits for tasks assessed      Auditory Comprehension   Overall Auditory Comprehension Appears within functional limits for tasks assessed      Verbal Expression   Overall Verbal Expression Impaired    Naming No impairment    Verbal Errors Phonemic paraphasias;Inconsistent    Effective Techniques Other (Comment)   Pt does not seem to be fully aware of the phonemic errors.   Other Verbal Expression Comments Higher-level word finding in conversation noted.      Written Expression   Dominant Hand Right    Written Expression Exceptions to [facility]    Overall Writen Expression Occasional errors at paragraph level. Pt is able to self-correct.  Standardized Assessments   Standardized Assessments  Other Assessment    Other Assessment WAB-Bedside                           SLP Education - 04/16/21 1425    Education Details Aphasia    Person(s) Educated Patient    Methods Explanation;Handout    Comprehension Verbalized understanding            SLP Short Term Goals - 04/16/21 1428      SLP SHORT TERM GOAL #1   Title Pt will recall and demonstrate 3 strategies for word finding.    Time 2    Period Weeks    Status New      SLP SHORT TERM GOAL #2   Title Pt will recall and demonstrate two strategies for assisting with writing.    Time 2    Period Weeks    Status New            SLP Long Term Goals - 04/16/21 1427      SLP LONG TERM GOAL #1   Title Pt will report use of word finding strategies at home.    Time 4    Period Weeks    Status New      SLP LONG TERM GOAL #2   Title Pt will report use of strategies for writing at home.    Time 4    Period Weeks    Status New             Plan - 04/16/21 1430    Clinical Impression Statement Pt is a 75 yo female who presents today following a CVA in April. Pt reports that she has trouble spelling and voices she has some trouble finding words or saying the wrong thing. SLP assessed pt using WAB-B. Pt was scored as "Anomic" aphasia. In conversation, pt was noted to have occasional phonemic paraphasias. Pt was able to repair written errors in her paragraph independently. SLP and patient spoke about "slowing down" and taking her time during tasks. SLP rec skilled speech services to address mild expressive language deficit to provide patient with strategies to assist with expressive language.    Speech Therapy Frequency 1x /week    Duration 4 weeks    Treatment/Interventions Patient/family education;Functional tasks;Language facilitation;Compensatory techniques;Internal/external aids;SLP instruction and feedback;Multimodal communcation approach    Potential to Achieve Goals Good    Consulted and Agree with Plan of Care Patient           Patient will benefit from skilled therapeutic intervention in order to improve the following deficits and impairments:   Aphasia    Problem List Patient Active Problem List   Diagnosis Date Noted  . CVA (cerebral vascular accident) (HCC) 03/14/2021  . Aphasia 03/12/2021  . Cerebrovascular accident (CVA) (HCC) 03/12/2021  . Diverticulitis 03/10/2021  . Diarrhea 03/10/2021  . Sore throat 02/24/2021  . Degenerative arthritis of right knee 12/02/2020  . Routine general medical examination at a health care facility 12/08/2018  . Bilateral impacted cerumen 05/03/2018  . Presbycusis of both ears 05/03/2018  . Adjustment disorder 06/05/2015  . Primary localized osteoarthrosis, lower leg 01/09/2014  . Hypothyroidism 11/02/2010  . Osteoporosis   . Hyperlipidemia 12/23/2008  . Gastroesophageal reflux disease 05/21/2008    [name], [place], CBIS  04/16/2021, 2:35 PM  [facility] [address] [place], [place], [address] Phone: [phone]  Fax:  [phone]  Name: Tiffany House MRN: [id number] Date of Birth: November 09, 1946

## 2021-04-17 ENCOUNTER — Telehealth: Payer: Self-pay | Admitting: Internal Medicine

## 2021-04-17 NOTE — Progress Notes (Signed)
  Chronic Care Management   Outreach Note  04/17/2021 Name: Tiffany House MRN: [id number] DOB: [birth year]  Referred by: Myrlene Broker, MD Reason for referral : No chief complaint on file.   An unsuccessful telephone outreach was attempted today. The patient was referred to the pharmacist for assistance with care management and care coordination.   Follow Up Plan:   Prathima Theone [place] Upstream Scheduler

## 2021-04-21 ENCOUNTER — Encounter: Payer: Self-pay | Admitting: Adult Health

## 2021-04-21 ENCOUNTER — Inpatient Hospital Stay: Payer: Medicare Other | Admitting: Adult Health

## 2021-04-21 NOTE — Progress Notes (Deleted)
[facility] [address] [place]. [place] [address] [phone]       HOSPITAL FOLLOW UP NOTE  [name] Date of Birth:  [birth year] Medical Record Number:  [id number]   Reason for Referral:  hospital stroke follow up    SUBJECTIVE:   CHIEF COMPLAINT:  No chief complaint on file.   HPI:   Tiffany House a 75 y.o.femalewith hx HTN, HLD, BPPVwho presented on 03/12/2021 withacute onset aphasia.  Personally reviewed hospitalization pertinent progress notes, lab work and imaging with summary provided.  Evaluated by Dr. Roda Shutters with stroke work-up revealing multiple areas of acute ischemia within the left parietal lobe, predominantly cortical with possible embolic source of unknown etiology.  Recommend placement of loop recorder for evaluation of possible A. fib as etiology.  Recommended DAPT for 3 weeks then aspirin alone.  LDL 71.3 and increase home dose Zocor 20 mg to 40 mg daily.  Other stroke risk factors include advanced age, hypothyroidism, current EtOH use and migraines.  Evaluated by therapies who recommended outpatient SLP and discharged home in stable condition.  Stroke - Multiple areas of acute ischemia within the left parietal lobe, predominantly cortical with possible emoblic source, work up in progress.   CT no acute abnormality.    CT head and neck unremarkable.   MRI showed left MCA infarct.    2D echo EF 55 to 60%  LE venous Doppler no DVT.    Consider LOOP recorder to be scheduled as outpatient on 5/9 with EP  LDL 71.3  A1c 5.7  UDS negative.  Recommend DAPT with plavix 75mg  and ASA 81mg  x 3 weeks then ASA 81mg  alone.   Therapy recommendations: Outpatient speech  Disposition: Home today  Today, 04/21/2021, Tiffany House is being seen for hospital follow-up  Reports residual aphasia and trouble with spelling.  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 04/23/2021     ROS:   14  system review of systems performed and negative with exception of ***  PMH:  Past Medical History:  Diagnosis Date  . Allergic rhinitis, cause unspecified   . Arthritis   . Benign positional vertigo   . GERD   . HYPERLIPIDEMIA   . Hypertension   . HYPOTHYROIDISM dx 10/2010  . Low back pain    MRI 02/2011  . Migraines   . OSTEOPENIA   . Osteoporosis    Qualifier: Diagnosis of  By: Elberta Fortis   . RENAL CALCULUS, RIGHT    L 01/2012    PSH:  Past Surgical History:  Procedure Laterality Date  . HAND SURGERY  2001   Dr Nena Jordan-- surgery of right hand to rebuild joint  . HERNIA REPAIR  03/2016  . Kidney stone removed  01/12/10  . TUBAL LIGATION  1979    Social History:  Social History   Socioeconomic History  . Marital status: Widowed    Spouse name: widow  . Number of children: 0  . Years of education: Not on file  . Highest education level: Not on file  Occupational History  . 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 Smoking status: Never Smoker  . Smokeless tobacco: Never Used  Vaping Use  . Vaping Use: Never used  Substance and Sexual Activity  . Alcohol use: Yes    Comment: occasional  . Drug use: No  . Sexual activity:  Never  Other Topics Concern  . Not on file  Social History Narrative  . Not on file   Social Determinants of Health   Financial Resource Strain: Not on file  Food Insecurity: No Food Insecurity  . Worried About Programme researcher, broadcasting/film/video in the Last Year: Never true  . Ran Out of Food in the Last Year: Never true  Transportation Needs: No Transportation Needs  . Lack of Transportation (Medical): No  . Lack of Transportation (Non-Medical): No  Physical Activity: Not on file  Stress: No Stress Concern Present  . Feeling of Stress : Not at all  Social Connections: Not on file  Intimate Partner Violence: Not At Risk  . Fear of Current or Ex-Partner: No  . Emotionally Abused: No  . Physically Abused: No  .  Sexually Abused: No    Family History:  Family History  Problem Relation Age of Onset  . Hyperlipidemia Mother   . Hypertension Mother   . Cancer Father        prostate. Tiffany House  . Coronary artery disease Father   . Hypertension Father     Medications:   Current Outpatient Medications on File Prior to Visit  Medication Sig Dispense Refill  . acetaminophen (TYLENOL) 325 MG tablet Take 650 mg by mouth See admin instructions. Take 650 mg by mouth at bedtime and an additional 650 mg every 6 hours as needed for pain    . aspirin 81 MG chewable tablet Chew 1 tablet (81 mg total) by mouth daily. 90 tablet 1  . calcium carbonate (OS-CAL - DOSED IN MG OF ELEMENTAL CALCIUM) 1250 (500 Ca) MG tablet Take 1 tablet by mouth 2 (two) times daily with a meal.    . Cholecalciferol (VITAMIN D-3) 1000 UNITS CAPS Take 1,000 Units by mouth in the morning and at bedtime.    . diclofenac sodium (VOLTAREN) 1 % GEL Apply 2 g topically 4 (four) times daily. (Patient taking differently: Apply 2 g topically 4 (four) times daily as needed (for back or knee pain).) 100 g 3  . fish oil-omega-3 fatty acids 1000 MG capsule Take 1 g by mouth every evening.    Marland Kitchen levothyroxine (SYNTHROID) 25 MCG tablet Take 1 tablet (25 mcg total) by mouth daily. 90 tablet 3  . Multiple Vitamin (MULTIVITAMIN) tablet Take 1 tablet by mouth daily.    . multivitamin-lutein (OCUVITE-LUTEIN) CAPS capsule Take 1 capsule by mouth daily.    Marland Kitchen omeprazole (PRILOSEC) 40 MG capsule Take 1 capsule (40 mg total) by mouth daily. 90 capsule 3  . PARoxetine (PAXIL) 10 MG tablet Take 1 tablet (10 mg total) by mouth daily. 90 tablet 3  . simvastatin (ZOCOR) 40 MG tablet Take 1 tablet (40 mg total) by mouth daily at 6 PM. 90 tablet 1   No current facility-administered medications on file prior to visit.    Allergies:   Allergies  Allergen Reactions  . Shellfish Allergy Nausea And Vomiting    Scallops and oysters  . Sulfonamide Derivatives Other  (See Comments)    Reaction not recalled      OBJECTIVE:  Physical Exam  There were no vitals filed for this visit. There is no height or weight on file to calculate BMI. No exam data present  Depression screen [facility] 2/9 04/01/2021  Decreased Interest 0  Down, Depressed, Hopeless 0  PHQ - 2 Score 0  Altered sleeping -  Tired, decreased energy -  Change in appetite -  Feeling bad  or failure about yourself  -  Trouble concentrating -  Moving slowly or fidgety/restless -  Suicidal thoughts -  PHQ-9 Score -  Difficult doing work/chores -     General: well developed, well nourished, seated, in no evident distress Head: head normocephalic and atraumatic.   Neck: supple with no carotid or supraclavicular bruits Cardiovascular: regular rate and rhythm, no murmurs Musculoskeletal: no deformity Skin:  no rash/petichiae Vascular:  Normal pulses all extremities   Neurologic Exam Mental Status: Awake and fully alert. Oriented to place and time. Recent and remote memory intact. Attention span, concentration and fund of knowledge appropriate. Mood and affect appropriate.  Cranial Nerves: Fundoscopic exam reveals sharp disc margins. Pupils equal, briskly reactive to light. Extraocular movements full without nystagmus. Visual fields full to confrontation. Hearing intact. Facial sensation intact. Face, tongue, palate moves normally and symmetrically.  Motor: Normal bulk and tone. Normal strength in all tested extremity muscles Sensory.: intact to touch , pinprick , position and vibratory sensation.  Coordination: Rapid alternating movements normal in all extremities. Finger-to-nose and heel-to-shin performed accurately bilaterally. Gait and Station: Arises from chair without difficulty. Stance is normal. Gait demonstrates normal stride length and balance with ***. Tandem walk and heel toe ***.  Reflexes: 1+ and symmetric. Toes downgoing.     NIHSS  *** Modified Rankin   ***      ASSESSMENT: Tiffany House is a 75 y.o. year old female presented with acute onset aphasia on 03/12/2021 with stroke work-up revealing multiple areas of acute ischemia within the left parietal lobe, embolic secondary to unknown source. Vascular risk factors include HTN, HLD, advanced age, EtOH use, hypothyroidism and migraines as well as history of BPPV.      PLAN:  1. Cryptogenic stroke: Residual deficit: ***. Continue aspirin 81 mg daily  and Zocor 40 mg daily for secondary stroke prevention.  Discussed secondary stroke prevention measures and importance of close PCP follow up for aggressive stroke risk factor management  2. HTN: BP goal <130/90.  Stable on *** per PCP 3. HLD: LDL goal <70. Recent LDL 71.3 -currently on Zocor 40 mg daily.  4.     Follow up in *** or call earlier if needed   CC:  [facility] provider: Dr. Pearlean Brownie PCP: Myrlene Broker, MD    I spent *** minutes of face-to-face and non-face-to-face time with patient.  This included previsit chart review, lab review, study review, order entry, electronic health record documentation, patient education regarding recent stroke, residual deficits, importance of managing stroke risk factors and answered all questions to patient satisfaction     Ihor Austin, [facility]  [facility] [address] [place], [place] [address]  Phone [phone] Fax [phone] Note: This document was prepared with digital dictation and possible smart phrase technology. Any transcriptional errors that result from this process are unintentional.

## 2021-04-23 ENCOUNTER — Encounter: Payer: Self-pay | Admitting: Speech Pathology

## 2021-04-23 ENCOUNTER — Ambulatory Visit (INDEPENDENT_AMBULATORY_CARE_PROVIDER_SITE_OTHER): Payer: Medicare HMO | Admitting: Adult Health

## 2021-04-23 ENCOUNTER — Encounter: Payer: Self-pay | Admitting: Adult Health

## 2021-04-23 ENCOUNTER — Ambulatory Visit: Payer: Medicare HMO | Admitting: Speech Pathology

## 2021-04-23 ENCOUNTER — Other Ambulatory Visit: Payer: Self-pay

## 2021-04-23 VITALS — BP 138/82 | HR 70 | Ht 59.0 in | Wt 138.0 lb

## 2021-04-23 DIAGNOSIS — R4701 Aphasia: Secondary | ICD-10-CM

## 2021-04-23 DIAGNOSIS — E785 Hyperlipidemia, unspecified: Secondary | ICD-10-CM

## 2021-04-23 DIAGNOSIS — I639 Cerebral infarction, unspecified: Secondary | ICD-10-CM | POA: Diagnosis not present

## 2021-04-23 DIAGNOSIS — I10 Essential (primary) hypertension: Secondary | ICD-10-CM | POA: Diagnosis not present

## 2021-04-23 NOTE — Patient Instructions (Signed)
Continue to follow with speech therapy for likely ongoing recovery  Continue aspirin 81 mg daily  and simvastatin for secondary stroke prevention  Your loop recorder will continue to be monitored by cardiology  Continue to follow up with PCP regarding cholesterol and blood pressure management  Maintain strict control of hypertension with blood pressure goal below 130/90 and cholesterol with LDL cholesterol (bad cholesterol) goal below 70 mg/dL.       Followup in the future with me in 3 months or call earlier if needed       Thank you for coming to see [place] at [facility]. I hope we have been able to provide you high quality care today.  You may receive a patient satisfaction survey over the next few weeks. We would appreciate your feedback and comments so that we may continue to improve ourselves and the health of our patients.    Aphasia Aphasia is a language disorder that affects the part of your brain that you use to communicate. Aphasia does not affect your intelligence, but you may have trouble:  Speaking.  Understanding speech.  Reading.  Writing. Some people with aphasia may also have trouble with memory or attention. Aphasia can happen to anyone at any age, but it is most common in older adults. What are the causes? This condition is caused by damage to the language centers of the brain. Damage may be caused by:  Stroke. This causes a disruption of blood flow to certain areas of the brain. Stroke is the most common cause of aphasia.  Traumatic brain injury (TBI).  Brain tumor.  Infection of the brain tissues.  Nervous system disease that gradually gets worse (progressiveneurological disorder), such as dementia or multiple sclerosis (MS).  Brain surgery. What are the signs or symptoms? Symptoms of this condition include:  Trouble finding the right words.  Difficulty expressing thoughts and needs through speech.  Using the wrong words,  nonsense words, or jargon.  Talking in sentences that do not make sense or that are not grammatically correct.  Being unable to repeat back words and phrases.  Difficulty expressing ideas through writing.  Difficulty comprehending when reading.  Being unable to understand other people's speech.  Having trouble understanding numbers. The condition affects people differently. Symptoms may start suddenly or come on gradually, depending on the underlying cause. How is this diagnosed? This condition may be diagnosed based on a screening of your ability to communicate as soon as symptoms start, or are medically stable after a stroke or brain injury. Later, a more comprehensive assessment may be conducted either in the hospital or rehabilitation center. The assessment may test your ability to:  Use speech to communicate your personal needs.  Use muscles in your mouth and throat for speaking and swallowing.  Express ideas with speech or other means of communication, such as hand gestures.  Make conversation with others across a variety of topics.  Hear and understand speech.  Understand and produce written material.  Manage memory and attention associated with communication. How is this treated? Treatment for this condition depends on your needs and abilities. The goal is to help restore your ability to communicate or find ways to manage communication challenges. Common treatments include:  Speech-language therapy. Part of this may include: ? Re-building intonation, sentence structure, and vocabulary. ? Learning other ways to communicate, such as using word books, communication boards, or special software programs. ? Learning to communicate with writing, sign language, or hand gestures. ? Using  a combination of methods to communicate.  Working with family members. This may include: ? Learning ways to communicate. ? Emotional support.  Support groups.  Occupational therapy. This  can help to find devices to assist with daily living activities. Treatment usually begins as soon as possible. It may begin while you are in the hospital and continue in a rehabilitation center or at home. In some cases, aphasia may improve quickly on its own. In other cases, recovery occurs more slowly over time. Follow these instructions at home:  Keep all follow-up visits as told by your health care provider. This is important.  Make sure you have a good support system at home.  Find a support group. This can help you connect with others who are going through the same thing.  Try the following tips while communicating: ? Use short, simple sentences. Ask family members to do the same. Sentences that require one-word or short answers are easiest. ? Avoid distractions like background noise when trying to listen or talk. ? Try communicating with gestures, pointing, writing, or drawing. ? Talk slowly. Ask family members to talk to you slowly. ? Maintain eye contact when communicating. Ask family members to do the same when communicating with you. ? Ask family members to give you time to respond or to engage in conversation with them.   Contact a health care provider if:  Your symptoms change or get worse.  You are struggling with anxiety or depression. Get help right away if you have:  Any symptoms of a stroke. "BE FAST" is an easy way to remember the main warning signs of a stroke: ? B - Balance. Signs are dizziness, sudden trouble walking, or loss of balance. ? E - Eyes. Signs are trouble seeing or a sudden change in vision. ? F - Face. Signs are a sudden weakness or numbness of the face, or the face or eyelid drooping on one side. ? A - Arms. Signs are weakness or numbness in an arm. This happens suddenly and usually on one side of the body. ? S - Speech. Signs are sudden trouble speaking, slurred speech, or trouble understanding what people say. ? T - Time. Time to call emergency  service. Write down what time symptoms started.  Other signs of a stroke, such as: ? A sudden, severe headache with no known cause. ? Nausea or vomiting. ? Seizure. Summary  Aphasia is a language disorder that results from damage to the part of your brain that helps you use language to communicate. Aphasia does not affect your intelligence, but may cause difficulty with speech, writing, reading, or understanding others.  In some cases, aphasia may improve quickly on its own. In other cases, recovery occurs more slowly over time.  Get help right away if you have symptoms of a stroke. This information is not intended to replace advice given to you by your health care provider. Make sure you discuss any questions you have with your health care provider. Document Revised: 11/02/2019 Document Reviewed: 12/13/2017 Elsevier Patient Education  2021 [employer].

## 2021-04-23 NOTE — Progress Notes (Signed)
I agree with the above plan 

## 2021-04-23 NOTE — Progress Notes (Signed)
[facility] [address] [place]. [place] [address] [phone]       HOSPITAL FOLLOW UP NOTE  [name] Date of Birth:  04-02-1946 Medical Record Number:  [id number]   Reason for Referral:  hospital stroke follow up    SUBJECTIVE:   CHIEF COMPLAINT:  Chief Complaint  Patient presents with  . Follow-up    RM 14 alone  Pt is well, having some fatigue, speech difficulty,  writing/spelling is sometimes difficult. No other complications     HPI:   Tiffany House a 75 y.o.femalewith hx HTN, HLD, BPPVwho presented on 03/12/2021 withacute onset aphasia.  Personally reviewed hospitalization pertinent progress notes, lab work and imaging with summary provided.  Evaluated by Dr. Roda Shutters with stroke work-up revealing multiple areas of acute ischemia within the left parietal lobe, predominantly cortical with possible embolic source of unknown etiology.  Recommend placement of loop recorder for evaluation of possible A. fib as etiology.  Recommended DAPT for 3 weeks then aspirin alone.  LDL 71.3 and increase home dose Zocor 20 mg to 40 mg daily.  Other stroke risk factors include advanced age, hypothyroidism, current EtOH use and migraines.  Evaluated by therapies who recommended outpatient SLP and discharged home in stable condition.  Stroke - Multiple areas of acute ischemia within the left parietal lobe, predominantly cortical with possible emoblic source of unknown etiology  CT no acute abnormality.    CT head and neck unremarkable.   MRI showed left MCA infarct.    2D echo EF 55 to 60%  LE venous Doppler no DVT.    Consider LOOP recorder to be scheduled as outpatient on 5/9 with EP  LDL 71.3  A1c 5.7  UDS negative.  Recommend DAPT with plavix 75mg  and ASA 81mg  x 3 weeks then ASA 81mg  alone.   Therapy recommendations: Outpatient speech  Disposition: Home today  Today, 04/21/2021, Tiffany House is being seen for hospital follow-up  unaccompanied  Reports residual: aphasia and trouble with spelling and numbers.  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 She does report some improvement since discharge Fatigue -this is been gradually improving Denies new stroke/TIA symptoms  Completed 3 weeks DAPT and remains on aspirin alone without associated side effects Remains on simvastatin 40 mg daily without associated side effects Blood pressure today initially elevated and on recheck 138/82 S/p placement of ILR 5/9 by Dr. 04/23/2021     ROS:   14 system review of systems performed and negative with exception of speech difficulty and fatigue  PMH:  Past Medical History:  Diagnosis Date  . Allergic rhinitis, cause unspecified   . Arthritis   . Benign positional vertigo   . GERD   . HYPERLIPIDEMIA   . Hypertension   . HYPOTHYROIDISM dx 10/2010  . Low back pain    MRI 02/2011  . Migraines   . OSTEOPENIA   . Osteoporosis    Qualifier: Diagnosis of  By: Elberta Fortis   . RENAL CALCULUS, RIGHT    L 01/2012    PSH:  Past Surgical History:  Procedure Laterality Date  . HAND SURGERY  2001   Dr Nena Jordan-- surgery of right hand to rebuild joint  . HERNIA REPAIR  03/2016  . Kidney stone removed  01/12/10  . TUBAL LIGATION  1979    Social History:  Social History   Socioeconomic History  . Marital status: Widowed    Spouse name: widow  .  Number of children: 0  . Years of education: Not on file  . Highest education level: Not on file  Occupational History  . 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 Smoking status: Never Smoker  . Smokeless tobacco: Never Used  Vaping Use  . Vaping Use: Never used  Substance and Sexual Activity  . Alcohol use: Yes    Comment: occasional  . Drug use: No  . Sexual activity: Never  Other Topics Concern  . Not on file  Social History Narrative  . Not on file   Social Determinants of Health   Financial Resource  Strain: Not on file  Food Insecurity: No Food Insecurity  . Worried About Programme researcher, broadcasting/film/video in the Last Year: Never true  . Ran Out of Food in the Last Year: Never true  Transportation Needs: No Transportation Needs  . Lack of Transportation (Medical): No  . Lack of Transportation (Non-Medical): No  Physical Activity: Not on file  Stress: No Stress Concern Present  . Feeling of Stress : Not at all  Social Connections: Not on file  Intimate Partner Violence: Not At Risk  . Fear of Current or Ex-Partner: No  . Emotionally Abused: No  . Physically Abused: No  . Sexually Abused: No    Family History:  Family History  Problem Relation Age of Onset  . Hyperlipidemia Mother   . Hypertension Mother   . Cancer Father        prostate. [name]  . Coronary artery disease Father   . Hypertension Father     Medications:   Current Outpatient Medications on File Prior to Visit  Medication Sig Dispense Refill  . acetaminophen (TYLENOL) 325 MG tablet Take 650 mg by mouth See admin instructions. Take 650 mg by mouth at bedtime and an additional 650 mg every 6 hours as needed for pain    . aspirin 81 MG chewable tablet Chew 1 tablet (81 mg total) by mouth daily. 90 tablet 1  . calcium carbonate (OS-CAL - DOSED IN MG OF ELEMENTAL CALCIUM) 1250 (500 Ca) MG tablet Take 1 tablet by mouth 2 (two) times daily with a meal.    . Cholecalciferol (VITAMIN D-3) 1000 UNITS CAPS Take 1,000 Units by mouth in the morning and at bedtime.    . diclofenac sodium (VOLTAREN) 1 % GEL Apply 2 g topically 4 (four) times daily. (Patient taking differently: Apply 2 g topically 4 (four) times daily as needed (for back or knee pain).) 100 g 3  . fish oil-omega-3 fatty acids 1000 MG capsule Take 1 g by mouth every evening.    Marland Kitchen levothyroxine (SYNTHROID) 25 MCG tablet Take 1 tablet (25 mcg total) by mouth daily. 90 tablet 3  . Multiple Vitamin (MULTIVITAMIN) tablet Take 1 tablet by mouth daily.    .  multivitamin-lutein (OCUVITE-LUTEIN) CAPS capsule Take 1 capsule by mouth daily.    Marland Kitchen omeprazole (PRILOSEC) 40 MG capsule Take 1 capsule (40 mg total) by mouth daily. 90 capsule 3  . PARoxetine (PAXIL) 10 MG tablet Take 1 tablet (10 mg total) by mouth daily. 90 tablet 3  . simvastatin (ZOCOR) 40 MG tablet Take 1 tablet (40 mg total) by mouth daily at 6 PM. 90 tablet 1   No current facility-administered medications on file prior to visit.    Allergies:   Allergies  Allergen Reactions  . Shellfish Allergy Nausea And Vomiting    Scallops and oysters  . Sulfonamide  Derivatives Other (See Comments)    Reaction not recalled      OBJECTIVE:  Physical Exam  Vitals:   04/23/21 1521  BP: 138/82  Pulse: 70  Weight: 138 lb (62.6 kg)  Height: 4\' 11"  (1.499 m)   Body mass index is 27.87 kg/m. No exam data present  Post stroke PHQ 2/9 Depression screen PHQ 2/9 04/01/2021  Decreased Interest 0  Down, Depressed, Hopeless 0  PHQ - 2 Score 0  Altered sleeping -  Tired, decreased energy -  Change in appetite -  Feeling bad or failure about yourself  -  Trouble concentrating -  Moving slowly or fidgety/restless -  Suicidal thoughts -  PHQ-9 Score -  Difficult doing work/chores -     General: well developed, well nourished,  very pleasant elderly Caucasian female, seated, in no evident distress Head: head normocephalic and atraumatic.   Neck: supple with no carotid or supraclavicular bruits Cardiovascular: regular rate and rhythm, no murmurs Musculoskeletal: no deformity Skin:  no rash/petichiae Vascular:  Normal pulses all extremities   Neurologic Exam Mental Status: Awake and fully alert.   Mild aphasia with occasional speech hesitancy.  Unable to appreciate dysarthria.  Oriented to place and time. Recent and remote memory intact. Attention span, concentration and fund of knowledge appropriate. Mood and affect appropriate.  Cranial Nerves: Fundoscopic exam reveals sharp disc  margins. Pupils equal, briskly reactive to light. Extraocular movements full without nystagmus. Visual fields full to confrontation. Hearing intact. Facial sensation intact. Face, tongue, palate moves normally and symmetrically.  Motor: Normal bulk and tone. Normal strength in all tested extremity muscles Sensory.: intact to touch , pinprick , position and vibratory sensation.  Coordination: Rapid alternating movements normal in all extremities. Finger-to-nose and heel-to-shin performed accurately bilaterally. Gait and Station: Arises from chair without difficulty. Stance is normal. Gait demonstrates normal stride length and balance without use of assistive device. Tandem walk and heel toe without difficulty.  Reflexes: 1+ and symmetric. Toes downgoing.     NIHSS  1 Modified Rankin  2      ASSESSMENT: Tiffany House is a 75 y.o. year old female presented with acute onset aphasia on 03/12/2021 with stroke work-up revealing multiple areas of acute ischemia within the left parietal lobe, embolic secondary to unknown source. Vascular risk factors include HTN, HLD, advanced age, EtOH use, hypothyroidism and migraines as well as history of BPPV.      PLAN:  1. Cryptogenic stroke:  a. Residual deficit: Anomic aphasia -encourage continued participation with SLP for hopeful further recovery.  b. S/p ILR 04/06/2021 c. Continue aspirin 81 mg daily  and Zocor 40 mg daily for secondary stroke prevention.   d. Discussed secondary stroke prevention measures and importance of close PCP follow up for aggressive stroke risk factor management  2. HTN: BP goal <130/90.  Stable on current regimen per PCP 3. HLD: LDL goal <70. Recent LDL 71.3 -currently on Zocor 40 mg daily.  Request follow-up with PCP in the next 1 to 2 months for repeat lipid panel and ongoing prescribing of statin    Follow up in 3 months or call earlier if needed   CC:  [facility] provider: Dr. 06/06/2021 PCP: Pearlean Brownie, MD    I  spent 53 minutes of face-to-face and non-face-to-face time with patient.  This included previsit chart review including recent hospitalization pertinent progress notes, lab work and imaging, electronic health record documentation, and extensive discussion with patient regarding recent stroke of unknown etiology,  placement of loop recorder and indication, residual deficits and typical recovery time, importance of managing stroke risk factors and compliance with all prescribed medications and answered all other questions to patient satisfaction  Ihor AustinJessica McCue, Cavhcs West CampusGNP-BC  [facility] [address] [place], [place] [address]  Phone [phone] Fax [phone] Note: This document was prepared with digital dictation and possible smart phrase technology. Any transcriptional errors that result from this process are unintentional.

## 2021-04-23 NOTE — Therapy (Signed)
[facility] [address] [place], [place], [address] Phone: [phone]   Fax:  [phone]  Speech Language Pathology Treatment  Patient Details  Name: Tiffany House MRN: [id number] Date of Birth: 16-Dec-1945 Referring Provider (SLP): Reva Bores   Encounter Date: 04/23/2021   End of Session - 04/23/21 1556    Visit Number 2    Number of Visits 4    Date for SLP Re-Evaluation 05/17/21    SLP Start Time 1400    SLP Stop Time  1449    SLP Time Calculation (min) 49 min    Activity Tolerance Patient tolerated treatment well           Past Medical History:  Diagnosis Date  . Allergic rhinitis, cause unspecified   . Arthritis   . Benign positional vertigo   . GERD   . HYPERLIPIDEMIA   . Hypertension   . HYPOTHYROIDISM dx 10/2010  . Low back pain    MRI 02/2011  . Migraines   . OSTEOPENIA   . Osteoporosis    Qualifier: Diagnosis of  By: Nena Jordan   . RENAL CALCULUS, RIGHT    L 01/2012    Past Surgical History:  Procedure Laterality Date  . HAND SURGERY  2001   Dr Teressa Senter-- surgery of right hand to rebuild joint  . HERNIA REPAIR  03/2016  . Kidney stone removed  01/12/10  . TUBAL LIGATION  1979    There were no vitals filed for this visit.   Subjective Assessment - 04/23/21 1404    Subjective Pt brought her word puzzle to show.    Currently in Pain? No/denies                 ADULT SLP TREATMENT - 04/23/21 1409      Treatment Provided   Treatment provided Cognitive-Linquistic      Cognitive-Linquistic Treatment   Treatment focused on Aphasia    Skilled Treatment Trained pt in word finding strategies for aphasia. Trained pt in phonological treatment using 12th-grade word list. Pt is able to spell basic words; however, has difficulty with higher-level vocabulary words. Pt prides herself on playing word games and reading, so this is abnormal for her.      Assessment / Recommendations / Plan    Plan Continue with current plan of care      Progression Toward Goals   Progression toward goals Progressing toward goals              SLP Short Term Goals - 04/23/21 1556      SLP SHORT TERM GOAL #1   Title Pt will recall and demonstrate 3 strategies for word finding.    Time 2    Period Weeks    Status On-going      SLP SHORT TERM GOAL #2   Title Pt will recall and demonstrate two strategies for assisting with writing.    Time 2    Period Weeks    Status On-going            SLP Long Term Goals - 04/23/21 1557      SLP LONG TERM GOAL #1   Title Pt will report use of word finding strategies at home.    Time 4    Period Weeks    Status On-going      SLP LONG TERM GOAL #2   Title Pt will report use of strategies for writing at home.  Time 4    Period Weeks    Status On-going            Plan - 04/23/21 1556    Clinical Impression Statement Pt is a 75 yo female who presents today following a CVA in April. Pt reports that she has trouble spelling and voices she has some trouble finding words or saying the wrong thing. SLP assessed pt using WAB-B. Pt was scored as "Anomic" aphasia. In conversation, pt was noted to have occasional phonemic paraphasias. Pt was able to repair written errors in her paragraph independently. SLP and patient spoke about "slowing down" and taking her time during tasks. SLP rec skilled speech services to address mild expressive language deficit to provide patient with strategies to assist with expressive language.    Speech Therapy Frequency 1x /week    Duration 4 weeks    Treatment/Interventions Patient/family education;Functional tasks;Language facilitation;Compensatory techniques;Internal/external aids;SLP instruction and feedback;Multimodal communcation approach    Potential to Achieve Goals Good    Consulted and Agree with Plan of Care Patient           Patient will benefit from skilled therapeutic intervention in order to  improve the following deficits and impairments:   Aphasia    Problem List Patient Active Problem List   Diagnosis Date Noted  . CVA (cerebral vascular accident) (HCC) 03/14/2021  . Aphasia 03/12/2021  . Cerebrovascular accident (CVA) (HCC) 03/12/2021  . Diverticulitis 03/10/2021  . Diarrhea 03/10/2021  . Sore throat 02/24/2021  . Degenerative arthritis of right knee 12/02/2020  . Routine general medical examination at a health care facility 12/08/2018  . Bilateral impacted cerumen 05/03/2018  . Presbycusis of both ears 05/03/2018  . Adjustment disorder 06/05/2015  . Primary localized osteoarthrosis, lower leg 01/09/2014  . Hypothyroidism 11/02/2010  . Osteoporosis   . Hyperlipidemia 12/23/2008  . Gastroesophageal reflux disease 05/21/2008    [name], [place], CBIS  04/23/2021, 3:57 PM  [facility] [address] [place], [place], [address] Phone: [phone]   Fax:  [phone]   Name: Tiffany House MRN: [id number] Date of Birth: January 05, 1946

## 2021-04-30 ENCOUNTER — Telehealth: Payer: Self-pay | Admitting: Internal Medicine

## 2021-04-30 ENCOUNTER — Other Ambulatory Visit: Payer: Self-pay

## 2021-04-30 ENCOUNTER — Encounter: Payer: Self-pay | Admitting: Speech Pathology

## 2021-04-30 ENCOUNTER — Ambulatory Visit: Payer: Medicare HMO | Attending: Family Medicine | Admitting: Speech Pathology

## 2021-04-30 DIAGNOSIS — R4701 Aphasia: Secondary | ICD-10-CM | POA: Insufficient documentation

## 2021-04-30 DIAGNOSIS — R41841 Cognitive communication deficit: Secondary | ICD-10-CM | POA: Diagnosis not present

## 2021-04-30 NOTE — Therapy (Signed)
[facility] [place] [facility] [address] [place], [place], [address] Phone: [phone]   Fax:  [phone]  Speech Language Pathology Treatment  Patient Details  Name: Tiffany House MRN: [id number] Date of Birth: 1946/09/10 Referring Provider (SLP): Reva Bores   Encounter Date: 04/30/2021   End of Session - 04/30/21 1408    Visit Number 3    Number of Visits 4    Date for SLP Re-Evaluation 05/17/21    SLP Start Time 1315    SLP Stop Time  1405    SLP Time Calculation (min) 50 min    Activity Tolerance Patient tolerated treatment well           Past Medical History:  Diagnosis Date  . Allergic rhinitis, cause unspecified   . Arthritis   . Benign positional vertigo   . GERD   . HYPERLIPIDEMIA   . Hypertension   . HYPOTHYROIDISM dx 10/2010  . Low back pain    MRI 02/2011  . Migraines   . OSTEOPENIA   . Osteoporosis    Qualifier: Diagnosis of  By: Nena Jordan   . RENAL CALCULUS, RIGHT    L 01/2012    Past Surgical History:  Procedure Laterality Date  . HAND SURGERY  2001   Dr Teressa Senter-- surgery of right hand to rebuild joint  . HERNIA REPAIR  03/2016  . Kidney stone removed  01/12/10  . TUBAL LIGATION  1979    There were no vitals filed for this visit.   Subjective Assessment - 04/30/21 1358    Subjective Pt reports that she is looking forward to learning sound-letter correspondance.    Currently in Pain? No/denies                 ADULT SLP TREATMENT - 04/30/21 1405      General Information   Behavior/Cognition Alert;Cooperative      Treatment Provided   Treatment provided Cognitive-Linquistic      Cognitive-Linquistic Treatment   Treatment focused on Aphasia    Skilled Treatment Trained pt in phonemes to begin phonomotor treatment which uses multimodal treatment (auditory, motor, orthographic, and tactile-kinesthetic) to strengthen phoneme sequence knowledge.      Assessment / Recommendations /  Plan   Plan Continue with current plan of care      Progression Toward Goals   Progression toward goals Progressing toward goals              SLP Short Term Goals - 04/30/21 1410      SLP SHORT TERM GOAL #1   Title Pt will recall and demonstrate 3 strategies for word finding.    Time 1    Period Weeks    Status Achieved      SLP SHORT TERM GOAL #2   Title Pt will recall and demonstrate two strategies for assisting with writing.    Time 1    Period Weeks    Status On-going            SLP Long Term Goals - 04/30/21 1411      SLP LONG TERM GOAL #1   Title Pt will report use of word finding strategies at home.    Time 2    Period Weeks    Status On-going      SLP LONG TERM GOAL #2   Title Pt will report use of strategies for writing at home.    Time 2    Period  Weeks    Status On-going            Plan - 04/30/21 1408    Clinical Impression Statement Began phonomotor treatment today to begin increasing phoneme knowledge/sound-to-letter correspondance, place/manner/voicing of cognate pairs. Pt required minA with high frequency sounds, though this may be attributed to hearing loss (pt has hearing aids).    Speech Therapy Frequency 1x /week    Duration 4 weeks    Treatment/Interventions Patient/family education;Functional tasks;Language facilitation;Compensatory techniques;Internal/external aids;SLP instruction and feedback;Multimodal communcation approach    Potential to Achieve Goals Good    Consulted and Agree with Plan of Care Patient           Patient will benefit from skilled therapeutic intervention in order to improve the following deficits and impairments:   Cognitive communication deficit    Problem List Patient Active Problem List   Diagnosis Date Noted  . CVA (cerebral vascular accident) (HCC) 03/14/2021  . Aphasia 03/12/2021  . Cerebrovascular accident (CVA) (HCC) 03/12/2021  . Diverticulitis 03/10/2021  . Diarrhea 03/10/2021  . Sore  throat 02/24/2021  . Degenerative arthritis of right knee 12/02/2020  . Routine general medical examination at a health care facility 12/08/2018  . Bilateral impacted cerumen 05/03/2018  . Presbycusis of both ears 05/03/2018  . Adjustment disorder 06/05/2015  . Primary localized osteoarthrosis, lower leg 01/09/2014  . Hypothyroidism 11/02/2010  . Osteoporosis   . Hyperlipidemia 12/23/2008  . Gastroesophageal reflux disease 05/21/2008    [name], [place], CBIS  04/30/2021, 2:11 PM  [facility] [place] [facility] [address] [place], [place], [address] Phone: [phone]   Fax:  [phone]   Name: Tiffany House MRN: [id number] Date of Birth: 1946-09-08

## 2021-04-30 NOTE — Progress Notes (Signed)
  Chronic Care Management   Outreach Note  04/30/2021 Name: Tiffany House MRN: [id number] DOB: 1945-12-21  Referred by: Myrlene Broker, MD Reason for referral : No chief complaint on file.   A second unsuccessful telephone outreach was attempted today. The patient was referred to pharmacist for assistance with care management and care coordination.  Follow Up Plan:   Carmell Austria Upstream Scheduler

## 2021-05-07 ENCOUNTER — Ambulatory Visit: Payer: Medicare HMO | Admitting: Speech Pathology

## 2021-05-07 ENCOUNTER — Other Ambulatory Visit: Payer: Self-pay

## 2021-05-07 ENCOUNTER — Encounter: Payer: Self-pay | Admitting: Speech Pathology

## 2021-05-07 ENCOUNTER — Other Ambulatory Visit: Payer: Self-pay | Admitting: *Deleted

## 2021-05-07 DIAGNOSIS — R41841 Cognitive communication deficit: Secondary | ICD-10-CM | POA: Diagnosis not present

## 2021-05-07 DIAGNOSIS — R4701 Aphasia: Secondary | ICD-10-CM

## 2021-05-07 NOTE — Patient Outreach (Signed)
[facility]) Care Management  05/07/2021  Tiffany House 08/23/1946 [id number]  THN follow up outreach to EMMI/complex referred patient with case closure  Tiffany House was referred to [facility] on 03/31/21 for red on EMMI alert reason: had follow up appointment This alter reason was addressed as it was partially correct on 04/01/21 She was able to provide all her appointment dates with her providers She agreed to further outreach  Insurance: [place] admissions x 1  ED visits x 1  in the last 6 months [facility] ([facility])- acute ischemia left parietal lobe hypothyroidism, aphasia, 03/12/21-03/14/21    Transition of care services noted to be completed by primary care MD office staff- Velora Heckler at [place]  Transition of Care will be completed by primary care provider office who will refer to [facility] care management if needed.  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  She has to re arrange or correct the numbers She denies concerns with nutrition/eating or worsening sleep patterns She denies falls   She updated RN CM that she has been to her appointments and is presently wearing a heart monitor that is being transmitted to her cardiology office. She has had some elevations in her blood pressure (BP) depending on her activity but is not always monitoring it at home She is aware of the need to keep BP <130/90 LDL <70 She reports no depression symptoms and coping well as she enjoys working in her  garden on her deck which she finds therapeutic  She also shares she is planning a 6-7 week trip to visit her brother in [place] in August 2022 Tiffany House is very knowledgeable about her home care needs Other home care includes monitoring her weight a few times a week with values ranging form 135-136 lbs  Progression of THN services  Reviewed  She prefers to discontinue follow up at this time  Advance directives were recently updated after her mother recently passed  Patient Active Problem List   Diagnosis Date Noted   CVA (cerebral vascular accident) ([place]) 03/14/2021   Aphasia 03/12/2021   Cerebrovascular accident (CVA) ([place]) 03/12/2021   Diverticulitis 03/10/2021   Diarrhea 03/10/2021   Sore throat 02/24/2021   Degenerative arthritis of right knee 12/02/2020   Routine general medical examination at a health care facility 12/08/2018   Bilateral impacted cerumen 05/03/2018   Presbycusis of both ears 05/03/2018   Adjustment disorder 06/05/2015   Primary localized osteoarthrosis, lower leg 01/09/2014   Hypothyroidism 11/02/2010   Osteoporosis    Hyperlipidemia 12/23/2008   Gastroesophageal reflux disease 05/21/2008     Plans THN case closure  Goals met pt prefers to discontinue follow up at this time  Goals Addressed               This Visit's Progress     Patient Stated     COMPLETED: [facility]) Keep or Improve My Strength-Stroke (pt-stated)   On track     Timeframe:  Short-Term Goal Priority:  Medium Start Date:  04/01/21               Expected End Date:         05/28/21              Follow Up Date 05/07/21   - eat healthy to increase strength - increase activity or exercise time a little every week      Notes:  05/07/21 reports she has returned to  completing her normal home activities to include cooking, gardening, Activities of daily living (ADLs) etc planning trip to [place] to see brother No barriers to safety, Goal met Completed speech tx  Request no further  outreaches closed        COMPLETED: [facility]) Make and Keep All Appointments (pt-stated)   On track     Timeframe:  Short-Term Goal Priority:  Medium Start Date:     04/01/21                        Expected End Date:  05/28/21                     Follow Up Date 05/07/21   - keep a calendar with appointment dates     Notes:  05/07/21 made all appointments and pending pcp appointment until after seen by cardiology and neurology Last seen by pcp on 03/09/21 prior to 03/12/21 admission          [place]. Lavina Hamman, RN, BSN, [place] Coordinator Office number [phone] [facility] number [phone] Fax number [phone]

## 2021-05-07 NOTE — Therapy (Signed)
[address] [place], [place], [address] Phone: [phone]   Fax:  [phone]  Speech Language Pathology Treatment  Patient Details  Name: Tiffany House MRN: [id number] Date of Birth: [birth year] Referring Provider (SLP): Donnamae Jude   Encounter Date: 05/07/2021   End of Session - 05/07/21 1356     Visit Number 4    Number of Visits 4    Date for SLP Re-Evaluation 05/17/21    SLP Start Time 0932    SLP Stop Time  6712    SLP Time Calculation (min) 43 min    Activity Tolerance Patient tolerated treatment well             Past Medical History:  Diagnosis Date   Allergic rhinitis, cause unspecified    Arthritis    Benign positional vertigo    GERD    HYPERLIPIDEMIA    Hypertension    HYPOTHYROIDISM dx 10/2010   Low back pain    MRI 02/2011   Migraines    OSTEOPENIA    Osteoporosis    Qualifier: Diagnosis of  By: Wynona Luna    RENAL CALCULUS, RIGHT    L 01/2012    Past Surgical History:  Procedure Laterality Date   HAND SURGERY  2001   Dr Daylene Katayama-- surgery of right hand to rebuild joint   HERNIA REPAIR  03/2016   Kidney stone removed  01/12/10   TUBAL LIGATION  1979    There were no vitals filed for this visit.   Subjective Assessment - 05/07/21 1353     Subjective Pt reports she has made improvement and is now able to play her boggle game with less assistance.    Currently in Pain? No/denies                   ADULT SLP TREATMENT - 05/07/21 1354       General Information   Behavior/Cognition Alert;Cooperative      Treatment Provided   Treatment provided Cognitive-Linquistic      Cognitive-Linquistic Treatment   Treatment focused on Aphasia    Skilled Treatment Trained pt in phonemes to begin phonomotor treatment which uses multimodal treatment (auditory, motor, orthographic, and tactile-kinesthetic) to strengthen phoneme sequence knowledge. Pt was able to complete  Stage 1 and 2 of treatment today. SLP progressed patient to writing sentences. x4 errors in conversational speech this session. SLP to d/c pt from therapy due to meeting all goals.      Assessment / Recommendations / Plan   Plan Continue with current plan of care;Discharge SLP treatment due to (comment)   All goals met     Progression Toward Goals   Progression toward goals Goals met, education completed, patient discharged from [place] - 05/07/21 1557       [place] #1   Title Pt will recall and demonstrate 3 strategies for word finding.    Time 1    Period Weeks    Status Achieved      SLP SHORT TERM GOAL #2   Title Pt will recall and demonstrate two strategies for assisting with writing.    Time 1    Period Weeks    Status Achieved              SLP Long Term Goals - 05/07/21 1557  SLP LONG TERM GOAL #1   Title Pt will report use of word finding strategies at home.    Time 2    Period Weeks    Status Achieved      SLP LONG TERM GOAL #2   Title Pt will report use of strategies for writing at home.    Time 2    Period Weeks    Status Achieved              Plan - 05/07/21 1556     Clinical Impression Statement Pt to be d/c'd from therapy after meeting all goals. Pt has limited errors in conversational speech and is able to correct them. Pt believes she has made improvement. To wait a month and reach out if she feels like she requires more therapy.    Speech Therapy Frequency 1x /week    Duration 4 weeks    Treatment/Interventions Patient/family education;Functional tasks;Language facilitation;Compensatory techniques;Internal/external aids;SLP instruction and feedback;Multimodal communcation approach    Potential to Achieve Goals Good    Consulted and Agree with Plan of Care Patient             Patient will benefit from skilled therapeutic intervention in order to improve the following deficits and  impairments:   Aphasia    Problem List Patient Active Problem List   Diagnosis Date Noted   CVA (cerebral vascular accident) ([place]) 03/14/2021   Aphasia 03/12/2021   Cerebrovascular accident (CVA) ([place]) 03/12/2021   Diverticulitis 03/10/2021   Diarrhea 03/10/2021   Sore throat 02/24/2021   Degenerative arthritis of right knee 12/02/2020   Routine general medical examination at a health care facility 12/08/2018   Bilateral impacted cerumen 05/03/2018   Presbycusis of both ears 05/03/2018   Adjustment disorder 06/05/2015   Primary localized osteoarthrosis, lower leg 01/09/2014   Hypothyroidism 11/02/2010   Osteoporosis    Hyperlipidemia 12/23/2008   Gastroesophageal reflux disease 05/21/2008   SPEECH THERAPY DISCHARGE SUMMARY  Visits from Start of Care: 4  Current functional level related to goals / functional outcomes: WFL   Remaining deficits: 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 Pt is able to self-correct.    Education / Equipment: Edu and d/c'd.   Plan: Patient agrees to discharge. Patient is being discharged due to meeting the stated rehab goals.       [place], [place], CBIS  05/07/2021, 3:58 PM  [address] [place], [place], [address] Phone: [phone]   Fax:  [phone]   Name: Tiffany House MRN: [id number] Date of Birth: 05-29-1946

## 2021-05-11 ENCOUNTER — Ambulatory Visit (INDEPENDENT_AMBULATORY_CARE_PROVIDER_SITE_OTHER): Payer: Medicare HMO

## 2021-05-11 DIAGNOSIS — I639 Cerebral infarction, unspecified: Secondary | ICD-10-CM | POA: Diagnosis not present

## 2021-05-14 LAB — CUP PACEART REMOTE DEVICE CHECK
Date Time Interrogation Session: 20220613221158
Implantable Pulse Generator Implant Date: 20220509

## 2021-05-15 ENCOUNTER — Other Ambulatory Visit: Payer: Self-pay

## 2021-05-15 ENCOUNTER — Encounter: Payer: Self-pay | Admitting: Internal Medicine

## 2021-05-15 ENCOUNTER — Ambulatory Visit (INDEPENDENT_AMBULATORY_CARE_PROVIDER_SITE_OTHER): Payer: Medicare HMO | Admitting: Internal Medicine

## 2021-05-15 DIAGNOSIS — Z8673 Personal history of transient ischemic attack (TIA), and cerebral infarction without residual deficits: Secondary | ICD-10-CM | POA: Diagnosis not present

## 2021-05-15 DIAGNOSIS — E782 Mixed hyperlipidemia: Secondary | ICD-10-CM | POA: Diagnosis not present

## 2021-05-15 NOTE — Assessment & Plan Note (Signed)
Taking aspirin 81 mg daily and simvastatin. BP at goal and does not have diabetes.

## 2021-05-15 NOTE — Assessment & Plan Note (Signed)
Statin was increased to 40 mg daily after stroke. Can refill when needed.

## 2021-05-15 NOTE — Progress Notes (Signed)
   Subjective:   Patient ID: Tiffany House, female    DOB: 07-Nov-1946, 75 y.o.   MRN: [id number]  HPI The patient is a 75 YO female coming in for follow up hospital for stroke (back in April, has been following up with cardiology and neurology, some mild speech problems still and having some difficulty with writing and numbers especially). She is doing well overall. Denies weakness or numbness in the arms or legs. Has implanted monitor to check for A fib. Taking aspirin and statin. BP at goal. Denies new symptoms of stroke and feels lucky.   Review of Systems  Constitutional: Negative.   HENT: Negative.    Eyes: Negative.   Respiratory:  Negative for cough, chest tightness and shortness of breath.   Cardiovascular:  Negative for chest pain, palpitations and leg swelling.  Gastrointestinal:  Negative for abdominal distention, abdominal pain, constipation, diarrhea, nausea and vomiting.  Musculoskeletal: Negative.   Skin: Negative.   Neurological:  Positive for speech difficulty.  Psychiatric/Behavioral: Negative.     Objective:  Physical Exam Constitutional:      Appearance: She is well-developed.  HENT:     Head: Normocephalic and atraumatic.  Cardiovascular:     Rate and Rhythm: Normal rate and regular rhythm.  Pulmonary:     Effort: Pulmonary effort is normal. No respiratory distress.     Breath sounds: Normal breath sounds. No wheezing or rales.  Abdominal:     General: Bowel sounds are normal. There is no distension.     Palpations: Abdomen is soft.     Tenderness: There is no abdominal tenderness. There is no rebound.  Musculoskeletal:     Cervical back: Normal range of motion.  Skin:    General: Skin is warm and dry.  Neurological:     Mental Status: She is alert and oriented to person, place, and time.     Coordination: Coordination normal.     Comments: Speech is fluid with occasional word finding pause    Vitals:   05/15/21 1526  BP: 138/86  Pulse: 98  Temp:  98.3 F (36.8 C)  TempSrc: Oral  SpO2: 97%  Weight: 137 lb 3.2 oz (62.2 kg)  Height: 4\' 11"  (1.499 m)    This visit occurred during the SARS-CoV-2 public health emergency.  Safety protocols were in place, including screening questions prior to the visit, additional usage of staff PPE, and extensive cleaning of exam room while observing appropriate contact time as indicated for disinfecting solutions.   Assessment & Plan:

## 2021-05-15 NOTE — Patient Instructions (Signed)
We do not need any labs today.   Think about getting the shingles vaccine at the pharmacy.

## 2021-05-27 ENCOUNTER — Telehealth: Payer: Self-pay | Admitting: Internal Medicine

## 2021-05-27 NOTE — Chronic Care Management (AMB) (Signed)
  Chronic Care Management   Outreach Note  05/27/2021 Name: DEJAI SCHUBACH MRN: [id number] DOB: [birth year]  Referred by: Myrlene Broker, MD Reason for referral : No chief complaint on file.   Third unsuccessful telephone outreach was attempted today. The patient was referred to the pharmacist for assistance with care management and care coordination.   Follow Up Plan:   Carmell [place] Upstream Scheduler

## 2021-06-02 NOTE — Progress Notes (Signed)
Carelink Summary Report / Loop Recorder 

## 2021-06-03 ENCOUNTER — Telehealth: Payer: Self-pay | Admitting: Internal Medicine

## 2021-06-03 NOTE — Chronic Care Management (AMB) (Signed)
  Chronic Care Management   Outreach Note  06/03/2021 Name: HARLI ENGELKEN MRN: [id number] DOB: [birth year]  Referred by: Myrlene Broker, MD Reason for referral : No chief complaint on file.   Third unsuccessful telephone outreach was attempted today. The patient was referred to the pharmacist for assistance with care management and care coordination.   Follow Up Plan:   Carmell [place] Upstream Scheduler

## 2021-06-15 ENCOUNTER — Ambulatory Visit (INDEPENDENT_AMBULATORY_CARE_PROVIDER_SITE_OTHER): Payer: Medicare HMO

## 2021-06-15 DIAGNOSIS — I639 Cerebral infarction, unspecified: Secondary | ICD-10-CM | POA: Diagnosis not present

## 2021-06-16 LAB — CUP PACEART REMOTE DEVICE CHECK
Date Time Interrogation Session: 20220716221556
Implantable Pulse Generator Implant Date: 20220509

## 2021-06-19 ENCOUNTER — Other Ambulatory Visit: Payer: Self-pay | Admitting: Internal Medicine

## 2021-07-02 ENCOUNTER — Other Ambulatory Visit: Payer: Self-pay

## 2021-07-02 ENCOUNTER — Encounter: Payer: Self-pay | Admitting: Adult Health

## 2021-07-02 ENCOUNTER — Ambulatory Visit: Payer: Medicare HMO | Admitting: Adult Health

## 2021-07-02 VITALS — BP 142/86 | HR 97 | Ht 59.5 in | Wt 141.9 lb

## 2021-07-02 DIAGNOSIS — I10 Essential (primary) hypertension: Secondary | ICD-10-CM | POA: Diagnosis not present

## 2021-07-02 DIAGNOSIS — I639 Cerebral infarction, unspecified: Secondary | ICD-10-CM | POA: Diagnosis not present

## 2021-07-02 DIAGNOSIS — R4701 Aphasia: Secondary | ICD-10-CM

## 2021-07-02 DIAGNOSIS — E785 Hyperlipidemia, unspecified: Secondary | ICD-10-CM | POA: Diagnosis not present

## 2021-07-02 NOTE — Progress Notes (Signed)
I agree with the above plan 

## 2021-07-02 NOTE — Patient Instructions (Signed)
Continue aspirin 81 mg daily  and simvastatin for secondary stroke prevention  Loop recorder has not shown atrial fibrillation thus far - will continue to be monitored monthly by cardiology  Continue to follow up with PCP regarding cholesterol and blood pressure management  Maintain strict control of hypertension with blood pressure goal below 130/90 and cholesterol with LDL cholesterol (bad cholesterol) goal below 70 mg/dL.       Followup in the future with me in 6 months or call earlier if needed       Thank you for coming to see [place] at [facility]. I hope we have been able to provide you high quality care today.  You may receive a patient satisfaction survey over the next few weeks. We would appreciate your feedback and comments so that we may continue to improve ourselves and the health of our patients.

## 2021-07-02 NOTE — Progress Notes (Signed)
[facility] [address] [place]. [place] [id number] [phone]       STROKE FOLLOW UP NOTE  [name] Date of Birth:  [birth year] Medical Record Number:  [id number]   Reason for Referral: stroke follow up    SUBJECTIVE:   CHIEF COMPLAINT:  Chief Complaint  Patient presents with   Stroke    Rm 2, 3 month FU "doing well, speech is improving, no new concerns"     HPI:   Today, 07/02/2021, Tiffany House returns for 3352-month stroke follow-up unaccompanied.  Completed SLP in June meeting all goals with mild residual aphasia - reports improvement since prior visit.  Denies new stroke/TIA symptoms.  Compliant on aspirin and simvastatin -denies side effects.  Blood pressure today 142/86.  Loop recorder has not shown atrial fibrillation thus far.  She has been staying with her brother over the past 2.5 weeks helping care for her sister in law with cancer currently on hospice which obviously has been stressful.  She does plan on going to [place] at the end of this month for 2 months to stay with her other brother which she is looking forward to.  No further concerns at this time.    History provided for reference purposes only Initial visit 04/21/2021 JM: [name] is being seen for hospital follow-up unaccompanied  Reports residual: aphasia and trouble with spelling and numbers.  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 She does report some improvement since discharge Fatigue -this is been gradually improving Denies new stroke/TIA symptoms  Completed 3 weeks DAPT and remains on aspirin alone without associated side effects Remains on simvastatin 40 mg daily without associated side effects Blood pressure today initially elevated and on recheck 138/82 S/p placement of ILR 5/9 by Dr. Elberta Fortisamnitz  Stroke admission 03/12/2021 Tiffany House is a 75 y.o. female with hx HTN, HLD, BPPV who presented on 03/12/2021 with acute onset aphasia.   Personally reviewed hospitalization pertinent progress notes, lab work and imaging with summary provided.  Evaluated by Dr. Roda ShuttersXu with stroke work-up revealing multiple areas of acute ischemia within the left parietal lobe, predominantly cortical with possible embolic source of unknown etiology.  Recommend placement of loop recorder for evaluation of possible A. fib as etiology.  Recommended DAPT for 3 weeks then aspirin alone.  LDL 71.3 and increase home dose Zocor 20 mg to 40 mg daily.  Other stroke risk factors include advanced age, hypothyroidism, current EtOH use and migraines.  Evaluated by therapies who recommended outpatient SLP and discharged home in stable condition.  Stroke - Multiple areas of acute ischemia within the left parietal lobe, predominantly cortical with possible emoblic source of unknown etiology CT no acute abnormality.   CT head and neck unremarkable.  MRI showed left MCA infarct.   2D echo EF 55 to 60% LE venous Doppler no DVT.   Consider LOOP recorder to be scheduled as outpatient on 5/9 with EP LDL 71.3 A1c 5.7 UDS negative. Recommend DAPT with plavix 75mg  and ASA 81mg  x 3 weeks then ASA 81mg  alone.  Therapy recommendations: Outpatient speech Disposition: Home today     ROS:   14 system review of systems performed and negative with exception of those listed in HPI  PMH:  Past Medical History:  Diagnosis Date   Allergic rhinitis, cause unspecified    Arthritis    Benign positional vertigo    GERD    HYPERLIPIDEMIA    Hypertension  HYPOTHYROIDISM dx 10/2010   Low back pain    MRI 02/2011   Migraines    OSTEOPENIA    Osteoporosis    Qualifier: Diagnosis of  By: Nena Jordan    RENAL CALCULUS, RIGHT    L 01/2012    PSH:  Past Surgical History:  Procedure Laterality Date   HAND SURGERY  2001   Dr Teressa Senter-- surgery of right hand to rebuild joint   HERNIA REPAIR  03/2016   Kidney stone removed  01/12/10   TUBAL LIGATION  1979    Social  History:  Social History   Socioeconomic History   Marital status: Widowed    Spouse name: widow   Number of children: 0   Years of education: Not on file   Highest education level: Not on file  Occupational History   Occupation: Copywriter, advertising: SELF EMPLOYED  Tobacco Use   Smoking status: Never   Smokeless tobacco: Never  Vaping Use   Vaping Use: Never used  Substance and Sexual Activity   Alcohol use: Yes    Comment: occasional   Drug use: No   Sexual activity: Never  Other Topics Concern   Not on file  Social History Narrative   Not on file   Social Determinants of Health   Financial Resource Strain: Not on file  Food Insecurity: No Food Insecurity   Worried About Running Out of Food in the Last Year: Never true   Ran Out of Food in the Last Year: Never true  Transportation Needs: No Transportation Needs   Lack of Transportation (Medical): No   Lack of Transportation (Non-Medical): No  Physical Activity: Not on file  Stress: No Stress Concern Present   Feeling of Stress : Not at all  Social Connections: Not on file  Intimate Partner Violence: Not At Risk   Fear of Current or Ex-Partner: No   Emotionally Abused: No   Physically Abused: No   Sexually Abused: No    Family History:  Family History  Problem Relation Age of Onset   Hyperlipidemia Mother    Hypertension Mother    Cancer Father        prostate. [name]   Coronary artery disease Father    Hypertension Father     Medications:   Current Outpatient Medications on File Prior to Visit  Medication Sig Dispense Refill   acetaminophen (TYLENOL) 325 MG tablet Take 650 mg by mouth See admin instructions. Take 650 mg by mouth at bedtime and an additional 650 mg every 6 hours as needed for pain     aspirin 81 MG chewable tablet Chew 1 tablet (81 mg total) by mouth daily. 90 tablet 1   calcium carbonate (OS-CAL - DOSED IN MG OF ELEMENTAL CALCIUM) 1250 (500 Ca) MG tablet Take 1 tablet by mouth 2  (two) times daily with a meal.     Cholecalciferol (VITAMIN D-3) 1000 UNITS CAPS Take 1,000 Units by mouth in the morning and at bedtime.     diclofenac sodium (VOLTAREN) 1 % GEL Apply 2 g topically 4 (four) times daily. (Patient taking differently: Apply 2 g topically 4 (four) times daily as needed (for back or knee pain).) 100 g 3   fish oil-omega-3 fatty acids 1000 MG capsule Take 1 g by mouth every evening.     levothyroxine (SYNTHROID) 25 MCG tablet Take 1 tablet (25 mcg total) by mouth daily. 90 tablet 3   Multiple Vitamin (MULTIVITAMIN) tablet Take  1 tablet by mouth daily.     multivitamin-lutein (OCUVITE-LUTEIN) CAPS capsule Take 1 capsule by mouth daily.     omeprazole (PRILOSEC) 40 MG capsule TAKE ONE CAPSULE BY MOUTH DAILY 90 capsule 3   PARoxetine (PAXIL) 10 MG tablet Take 1 tablet (10 mg total) by mouth daily. 90 tablet 3   simvastatin (ZOCOR) 40 MG tablet Take 1 tablet (40 mg total) by mouth daily at 6 PM. 90 tablet 1   No current facility-administered medications on file prior to visit.    Allergies:   Allergies  Allergen Reactions   Shellfish Allergy Nausea And Vomiting    Scallops and oysters   Sulfonamide Derivatives Other (See Comments)    Reaction not recalled      OBJECTIVE:  Physical Exam  Vitals:   07/02/21 1440  BP: (!) 142/86  Pulse: 97  Weight: 141 lb 14.4 oz (64.4 kg)  Height: 4' 11.5" (1.511 m)    Body mass index is 28.18 kg/m. No results found.  General: well developed, well nourished, very pleasant elderly Caucasian female, seated, in no evident distress Head: head normocephalic and atraumatic.   Neck: supple with no carotid or supraclavicular bruits Cardiovascular: regular rate and rhythm, no murmurs Musculoskeletal: no deformity Skin:  no rash/petichiae Vascular:  Normal pulses all extremities   Neurologic Exam Mental Status: Awake and fully alert. Occasional word finding difficulty and speech hesitancy but overall greatly improved  since prior visit.  Unable to appreciate dysarthria.  Oriented to place and time. Recent and remote memory intact. Attention span, concentration and fund of knowledge appropriate. Mood and affect appropriate.  Cranial Nerves: Pupils equal, briskly reactive to light. Extraocular movements full without nystagmus. Visual fields full to confrontation. Hearing intact. Facial sensation intact. Face, tongue, palate moves normally and symmetrically.  Motor: Normal bulk and tone. Normal strength in all tested extremity muscles Sensory.: intact to touch , pinprick , position and vibratory sensation.  Coordination: Rapid alternating movements normal in all extremities. Finger-to-nose and heel-to-shin performed accurately bilaterally. Gait and Station: Arises from chair without difficulty. Stance is normal. Gait demonstrates normal stride length and balance without use of assistive device. Tandem walk and heel toe without difficulty.  Reflexes: 1+ and symmetric. Toes downgoing.         ASSESSMENT: Tiffany House is a 75 y.o. year old female presented with acute onset aphasia on 03/12/2021 with stroke work-up revealing multiple areas of acute ischemia within the left parietal lobe, embolic secondary to unknown source. Vascular risk factors include HTN, HLD, advanced age, EtOH use, hypothyroidism and migraines as well as history of BPPV.      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   Discussed secondary stroke prevention measures and importance of close PCP follow up for aggressive stroke risk factor management  HTN: BP goal <130/90.  Stable on current regimen per PCP HLD: LDL goal <70. Prior LDL 71.3 (02/2021) on simvastatin 40 mg daily per PCP    Follow up in 6 months or call earlier if needed   CC:   [facility] provider: Dr. Pearlean Brownie PCP: Myrlene Broker, MD     I spent 32 minutes of face-to-face and non-face-to-face time with patient.  This included previsit chart review, lab review, study review, electronic health record documentation, patient education and discussion regarding  prior stroke, secondary stroke prevention measures and aggressive stroke risk factor management, review of ILR, residual deficits and answered all other questions to patient satisfaction  Ihor Austin, AGNP-BC  [facility] [address] [place], [place] [address]  Phone [phone] Fax [phone] Note: This document was prepared with digital dictation and possible smart phrase technology. Any transcriptional errors that result from this process are unintentional.

## 2021-07-07 NOTE — Progress Notes (Signed)
Carelink Summary Report / Loop Recorder 

## 2021-07-20 ENCOUNTER — Ambulatory Visit (INDEPENDENT_AMBULATORY_CARE_PROVIDER_SITE_OTHER): Payer: Medicare HMO

## 2021-07-20 DIAGNOSIS — I639 Cerebral infarction, unspecified: Secondary | ICD-10-CM | POA: Diagnosis not present

## 2021-07-20 LAB — CUP PACEART REMOTE DEVICE CHECK
Date Time Interrogation Session: 20220818221551
Implantable Pulse Generator Implant Date: 20220509

## 2021-07-21 ENCOUNTER — Other Ambulatory Visit: Payer: Self-pay | Admitting: Family Medicine

## 2021-07-28 ENCOUNTER — Telehealth: Payer: Self-pay | Admitting: Internal Medicine

## 2021-07-28 NOTE — Telephone Encounter (Signed)
Left message for patient to call me back at [phone] to schedule Medicare Annual Wellness Visit   Last AWV  05/16/19  Please schedule at anytime with [facility] Health Advisor if patient calls the office back.    40 Minutes appointment   Any questions, please call me at [phone]  

## 2021-08-05 NOTE — Progress Notes (Signed)
Carelink Summary Report / Loop Recorder 

## 2021-08-19 LAB — CUP PACEART REMOTE DEVICE CHECK
Date Time Interrogation Session: 20220920221302
Implantable Pulse Generator Implant Date: 20220509

## 2021-08-24 ENCOUNTER — Ambulatory Visit (INDEPENDENT_AMBULATORY_CARE_PROVIDER_SITE_OTHER): Payer: Medicare HMO

## 2021-08-24 DIAGNOSIS — I639 Cerebral infarction, unspecified: Secondary | ICD-10-CM

## 2021-08-31 NOTE — Progress Notes (Signed)
Carelink Summary Report / Loop Recorder 

## 2021-09-28 DIAGNOSIS — F419 Anxiety disorder, unspecified: Secondary | ICD-10-CM | POA: Diagnosis not present

## 2021-09-28 DIAGNOSIS — Z8249 Family history of ischemic heart disease and other diseases of the circulatory system: Secondary | ICD-10-CM | POA: Diagnosis not present

## 2021-09-28 DIAGNOSIS — M1990 Unspecified osteoarthritis, unspecified site: Secondary | ICD-10-CM | POA: Diagnosis not present

## 2021-09-28 DIAGNOSIS — Z8673 Personal history of transient ischemic attack (TIA), and cerebral infarction without residual deficits: Secondary | ICD-10-CM | POA: Diagnosis not present

## 2021-09-28 DIAGNOSIS — R030 Elevated blood-pressure reading, without diagnosis of hypertension: Secondary | ICD-10-CM | POA: Diagnosis not present

## 2021-09-28 DIAGNOSIS — Z7982 Long term (current) use of aspirin: Secondary | ICD-10-CM | POA: Diagnosis not present

## 2021-09-28 DIAGNOSIS — E039 Hypothyroidism, unspecified: Secondary | ICD-10-CM | POA: Diagnosis not present

## 2021-09-28 DIAGNOSIS — Z809 Family history of malignant neoplasm, unspecified: Secondary | ICD-10-CM | POA: Diagnosis not present

## 2021-09-28 DIAGNOSIS — K219 Gastro-esophageal reflux disease without esophagitis: Secondary | ICD-10-CM | POA: Diagnosis not present

## 2021-09-28 DIAGNOSIS — F3341 Major depressive disorder, recurrent, in partial remission: Secondary | ICD-10-CM | POA: Diagnosis not present

## 2021-09-28 DIAGNOSIS — G8929 Other chronic pain: Secondary | ICD-10-CM | POA: Diagnosis not present

## 2021-09-28 DIAGNOSIS — E785 Hyperlipidemia, unspecified: Secondary | ICD-10-CM | POA: Diagnosis not present

## 2021-09-28 DIAGNOSIS — Z008 Encounter for other general examination: Secondary | ICD-10-CM | POA: Diagnosis not present

## 2021-09-28 DIAGNOSIS — R69 Illness, unspecified: Secondary | ICD-10-CM | POA: Diagnosis not present

## 2021-10-05 ENCOUNTER — Other Ambulatory Visit: Payer: Self-pay | Admitting: Family Medicine

## 2022-01-06 ENCOUNTER — Encounter: Payer: Self-pay | Admitting: Adult Health

## 2022-01-06 ENCOUNTER — Telehealth: Payer: Self-pay

## 2022-01-06 ENCOUNTER — Ambulatory Visit: Payer: Medicare HMO | Admitting: Adult Health

## 2022-01-06 VITALS — BP 144/86 | HR 93 | Ht 59.0 in | Wt 146.0 lb

## 2022-01-06 DIAGNOSIS — I639 Cerebral infarction, unspecified: Secondary | ICD-10-CM | POA: Diagnosis not present

## 2022-01-06 DIAGNOSIS — R4701 Aphasia: Secondary | ICD-10-CM

## 2022-01-06 NOTE — Telephone Encounter (Signed)
I have spoke with patient and we were unable to get the phone to work in order to send a transmission. Patient will come in office 01/07/2022 at 10 am for [place] to help her

## 2022-01-06 NOTE — Telephone Encounter (Signed)
LMOVM for pt to give the device clinic a call. I need her to get into her monitor for it to transmit.

## 2022-01-06 NOTE — Telephone Encounter (Signed)
Patient is returning call. Call transferred to [place].

## 2022-01-06 NOTE — Telephone Encounter (Signed)
LMOVM for patient to return my call

## 2022-01-06 NOTE — Progress Notes (Signed)
[facility] [address] [place]. [place] [address] [phone]       STROKE FOLLOW UP NOTE  Tiffany House Date of Birth:  03-May-1946 Medical Record Number:  [id number]   Reason for Referral: stroke follow up    SUBJECTIVE:   CHIEF COMPLAINT:  Chief Complaint  Patient presents with   Follow-up    Rm 3 alone Pt is well and stable, making improvement. No new concerns      HPI:   Update 01/06/2022 JM: Returns for 89-month stroke follow-up.  Overall stable without new stroke/TIA symptoms.  Reports aphasia improved since prior visit -occasional difficulty. Continued difficulties with remembering number sequences.  Continues routine memory exercises with word puzzles.  Compliant on aspirin and simvastatin without side effects.  Blood pressure today initially 179/103 and on recheck 140/86. Prior loop recorder download 07/2021 without evidence of A-fib up to that point. No additional reports since then as monitor disconnected - has f/u visit with cardiology tomorrow to reconnect. She is planning another trip to [place] for 2 months in the spring and winter to visit her brother which she greatly enjoys.  No new concerns at this time.     History provided for reference purposes only Update 07/02/2021 JM: Tiffany House returns for 20-month stroke follow-up unaccompanied.  Completed SLP in June meeting all goals with mild residual aphasia - reports improvement since prior visit.  Denies new stroke/TIA symptoms.  Compliant on aspirin and simvastatin -denies side effects.  Blood pressure today 142/86.  Loop recorder has not shown atrial fibrillation thus far.  She has been staying with her brother over the past 2.5 weeks helping care for her sister in law with cancer currently on hospice which obviously has been stressful.  She does plan on going to [place] at the end of this month for 2 months to stay with her other brother which she is looking forward to.  No further concerns at  this time.  Initial visit 04/21/2021 JM: Tiffany House is being seen for hospital follow-up unaccompanied  Reports residual: aphasia and trouble with spelling and numbers.  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 She does report some improvement since discharge Fatigue -this is been gradually improving Denies new stroke/TIA symptoms  Completed 3 weeks DAPT and remains on aspirin alone without associated side effects Remains on simvastatin 40 mg daily without associated side effects Blood pressure today initially elevated and on recheck 138/82 S/p placement of ILR 5/9 by Dr. Curt Bears  Stroke admission 03/12/2021 Tiffany House is a 76 y.o. female with hx HTN, HLD, BPPV who presented on 03/12/2021 with acute onset aphasia.  Personally reviewed hospitalization pertinent progress notes, lab work and imaging with summary provided.  Evaluated by Dr. Erlinda Hong with stroke work-up revealing multiple areas of acute ischemia within the left parietal lobe, predominantly cortical with possible embolic source of unknown etiology.  Recommend placement of loop recorder for evaluation of possible A. fib as etiology.  Recommended DAPT for 3 weeks then aspirin alone.  LDL 71.3 and increase home dose Zocor 20 mg to 40 mg daily.  Other stroke risk factors include advanced age, hypothyroidism, current EtOH use and migraines.  Evaluated by therapies who recommended outpatient SLP and discharged home in stable condition.  Stroke - Multiple areas of acute ischemia within the left parietal lobe, predominantly cortical with possible emoblic source of unknown etiology CT no acute abnormality.   CT head and neck unremarkable.  MRI showed left MCA infarct.   2D echo EF 55 to 60% LE venous Doppler no DVT.   Consider LOOP recorder to be scheduled as outpatient on 5/9 with EP LDL 71.3 A1c 5.7 UDS negative. Recommend DAPT with plavix 75mg  and ASA 81mg  x 3 weeks then ASA 81mg  alone.  Therapy recommendations:  Outpatient speech Disposition: Home today     ROS:   14 system review of systems performed and negative with exception of those listed in HPI  PMH:  Past Medical History:  Diagnosis Date   Allergic rhinitis, cause unspecified    Arthritis    Benign positional vertigo    GERD    HYPERLIPIDEMIA    Hypertension    HYPOTHYROIDISM dx 10/2010   Low back pain    MRI 02/2011   Migraines    OSTEOPENIA    Osteoporosis    Qualifier: Diagnosis of  By: Wynona Luna    RENAL CALCULUS, RIGHT    L 01/2012    PSH:  Past Surgical History:  Procedure Laterality Date   HAND SURGERY  2001   Dr Daylene Katayama-- surgery of right hand to rebuild joint   HERNIA REPAIR  03/2016   Kidney stone removed  01/12/10   TUBAL LIGATION  1979    Social History:  Social History   Socioeconomic History   Marital status: Widowed    Spouse name: widow   Number of children: 0   Years of education: Not on file   Highest education level: Not on file  Occupational History   Occupation: Games developer: SELF EMPLOYED  Tobacco Use   Smoking status: Never   Smokeless tobacco: Never  Vaping Use   Vaping Use: Never used  Substance and Sexual Activity   Alcohol use: Yes    Comment: occasional   Drug use: No   Sexual activity: Never  Other Topics Concern   Not on file  Social History Narrative   Not on file   Social Determinants of Health   Financial Resource Strain: Not on file  Food Insecurity: No Food Insecurity   Worried About Running Out of Food in the Last Year: Never true   [place] in the Last Year: Never true  Transportation Needs: No Transportation Needs   Lack of Transportation (Medical): No   Lack of Transportation (Non-Medical): No  Physical Activity: Not on file  Stress: No Stress Concern Present   Feeling of Stress : Not at all  Social Connections: Not on file  Intimate Partner Violence: Not At Risk   Fear of Current or Ex-Partner: No   Emotionally Abused: No    Physically Abused: No   Sexually Abused: No    Family History:  Family History  Problem Relation Age of Onset   Hyperlipidemia Mother    Hypertension Mother    Cancer Father        prostate. Suzie Portela   Coronary artery disease Father    Hypertension Father     Medications:   Current Outpatient Medications on File Prior to Visit  Medication Sig Dispense Refill   acetaminophen (TYLENOL) 325 MG tablet Take 650 mg by mouth See admin instructions. Take 650 mg by mouth at bedtime and an additional 650 mg every 6 hours as needed for pain     aspirin 81 MG chewable tablet Chew 1 tablet (81 mg total) by mouth daily. 90 tablet 1   calcium carbonate (OS-CAL - DOSED IN MG OF  ELEMENTAL CALCIUM) 1250 (500 Ca) MG tablet Take 1 tablet by mouth 2 (two) times daily with a meal.     Cholecalciferol (VITAMIN D-3) 1000 UNITS CAPS Take 1,000 Units by mouth in the morning and at bedtime.     diclofenac sodium (VOLTAREN) 1 % GEL Apply 2 g topically 4 (four) times daily. (Patient taking differently: Apply 2 g topically 4 (four) times daily as needed (for back or knee pain).) 100 g 3   fish oil-omega-3 fatty acids 1000 MG capsule Take 1 g by mouth every evening.     levothyroxine (SYNTHROID) 25 MCG tablet Take 1 tablet (25 mcg total) by mouth daily. 90 tablet 3   Magnesium Hydroxide (MAGNESIA PO) Take by mouth daily.     Multiple Vitamin (MULTIVITAMIN) tablet Take 1 tablet by mouth daily.     multivitamin-lutein (OCUVITE-LUTEIN) CAPS capsule Take 1 capsule by mouth daily.     omeprazole (PRILOSEC) 40 MG capsule TAKE ONE CAPSULE BY MOUTH DAILY 90 capsule 3   PARoxetine (PAXIL) 10 MG tablet Take 1 tablet (10 mg total) by mouth daily. 90 tablet 3   simvastatin (ZOCOR) 40 MG tablet Take 1 tablet (40 mg total) by mouth daily at 6 PM. 90 tablet 1   No current facility-administered medications on file prior to visit.    Allergies:   Allergies  Allergen Reactions   Shellfish Allergy Nausea And Vomiting     Scallops and oysters   Sulfonamide Derivatives Other (See Comments)    Reaction not recalled      OBJECTIVE:  Physical Exam  Vitals:   01/06/22 1517 01/06/22 1558  BP: (!) 179/103 (!) 144/86  Pulse: 93   Weight: 146 lb (66.2 kg)   Height: 4\' 11"  (1.499 m)      Body mass index is 29.49 kg/m. No results found.  General: well developed, well nourished, very pleasant elderly Caucasian female, seated, in no evident distress Head: head normocephalic and atraumatic.   Neck: supple with no carotid or supraclavicular bruits Cardiovascular: regular rate and rhythm, no murmurs Musculoskeletal: no deformity Skin:  no rash/petichiae Vascular:  Normal pulses all extremities   Neurologic Exam Mental Status: Awake and fully alert.  Fluent speech and language throughout visit.  Oriented to place and time. Recent and remote memory intact. Attention span, concentration and fund of knowledge appropriate. Mood and affect appropriate.  Cranial Nerves: Pupils equal, briskly reactive to light. Extraocular movements full without nystagmus. Visual fields full to confrontation. Hearing intact. Facial sensation intact. Face, tongue, palate moves normally and symmetrically.  Motor: Normal bulk and tone. Normal strength in all tested extremity muscles Sensory.: intact to touch , pinprick , position and vibratory sensation.  Coordination: Rapid alternating movements normal in all extremities. Finger-to-nose and heel-to-shin performed accurately bilaterally. Gait and Station: Arises from chair without difficulty. Stance is normal. Gait demonstrates normal stride length and balance without use of assistive device. Tandem walk and heel toe without difficulty.  Reflexes: 1+ and symmetric. Toes downgoing.         ASSESSMENT: Tiffany House is a 76 y.o. year old female presented with acute onset aphasia on 03/12/2021 with stroke work-up revealing multiple areas of acute ischemia within the left parietal  lobe, embolic secondary to unknown source. Vascular risk factors include HTN, HLD, advanced age, EtOH use, hypothyroidism and migraines as well as history of BPPV.      PLAN:  Cryptogenic stroke:  Residual deficit: Occasional word finding difficulty but overall greatly improved -unable to  appreciate aphasia during today's visit S/p ILR 04/06/2021 - last download 07/2021 without evidence of A-fib - has appt tomorrow with cardiology to reconnect Continue aspirin 81 mg daily  and Zocor 40 mg daily for secondary stroke prevention.   Discussed secondary stroke prevention measures and importance of close PCP follow up for aggressive stroke risk factor management including HTN with BP goal<130/90 and HLD with LDL goal<70   Overall stable without further recommendations.  Follow-up as needed.   CC:  PCP: Hoyt Koch, MD    I spent 26 minutes of face-to-face and non-face-to-face time with patient.  This included previsit chart review, lab review, study review, electronic health record documentation, patient education and discussion regarding prior stroke, secondary stroke prevention measures and aggressive stroke risk factor management, review of ILR, residual deficits and answered all other questions to patient satisfaction  Frann Rider, AGNP-BC  [facility] [address] [place], [place] [address]  Phone [phone] Fax [phone] Note: This document was prepared with digital dictation and possible smart phrase technology. Any transcriptional errors that result from this process are unintentional.

## 2022-01-06 NOTE — Patient Instructions (Signed)
Continue aspirin 81 mg daily  and atorvastatin  for secondary stroke prevention  Cardiology will continue to monitor loop recorder - has not shown atrial fibrillation thus far  Continue to follow up with PCP regarding cholesterol and blood pressure management  Maintain strict control of hypertension with blood pressure goal below 130/90 and cholesterol with LDL cholesterol (bad cholesterol) goal below 70 mg/dL.   Signs of a Stroke? Follow the BEFAST method:  Balance Watch for a sudden loss of balance, trouble with coordination or vertigo Eyes Is there a sudden loss of vision in one or both eyes? Or double vision?  Face: Ask the person to smile. Does one side of the face droop or is it numb?  Arms: Ask the person to raise both arms. Does one arm drift downward? Is there weakness or numbness of a leg? Speech: Ask the person to repeat a simple phrase. Does the speech sound slurred/strange? Is the person confused ? Time: If you observe any of these signs, call [phone].        Thank you for coming to see [place] at [facility]. I hope we have been able to provide you high quality care today.  You may receive a patient satisfaction survey over the next few weeks. We would appreciate your feedback and comments so that we may continue to improve ourselves and the health of our patients.

## 2022-01-08 NOTE — Telephone Encounter (Signed)
LMOVM for patient to call and get monitor connected.

## 2022-01-09 LAB — CUP PACEART REMOTE DEVICE CHECK
Date Time Interrogation Session: 20230210230809
Implantable Pulse Generator Implant Date: 20220509

## 2022-01-11 ENCOUNTER — Ambulatory Visit (INDEPENDENT_AMBULATORY_CARE_PROVIDER_SITE_OTHER): Payer: Medicare HMO

## 2022-01-11 DIAGNOSIS — I639 Cerebral infarction, unspecified: Secondary | ICD-10-CM | POA: Diagnosis not present

## 2022-01-13 NOTE — Progress Notes (Signed)
Carelink Summary Report / Loop Recorder 

## 2022-01-18 NOTE — Telephone Encounter (Signed)
Letter sent 01/18/2022

## 2022-02-05 ENCOUNTER — Emergency Department (HOSPITAL_COMMUNITY)
Admission: EM | Admit: 2022-02-05 | Discharge: 2022-02-05 | Disposition: A | Discharge status: Home / Self Care | Payer: Medicare HMO | Attending: Emergency Medicine | Admitting: Emergency Medicine

## 2022-02-05 ENCOUNTER — Encounter (HOSPITAL_COMMUNITY): Payer: Self-pay

## 2022-02-05 ENCOUNTER — Emergency Department (HOSPITAL_COMMUNITY): Payer: Medicare HMO

## 2022-02-05 DIAGNOSIS — N201 Calculus of ureter: Secondary | ICD-10-CM | POA: Diagnosis not present

## 2022-02-05 DIAGNOSIS — K439 Ventral hernia without obstruction or gangrene: Secondary | ICD-10-CM | POA: Diagnosis not present

## 2022-02-05 DIAGNOSIS — N132 Hydronephrosis with renal and ureteral calculous obstruction: Secondary | ICD-10-CM | POA: Diagnosis not present

## 2022-02-05 DIAGNOSIS — N202 Calculus of kidney with calculus of ureter: Secondary | ICD-10-CM | POA: Insufficient documentation

## 2022-02-05 DIAGNOSIS — R109 Unspecified abdominal pain: Secondary | ICD-10-CM | POA: Diagnosis not present

## 2022-02-05 DIAGNOSIS — E039 Hypothyroidism, unspecified: Secondary | ICD-10-CM | POA: Diagnosis not present

## 2022-02-05 DIAGNOSIS — K5730 Diverticulosis of large intestine without perforation or abscess without bleeding: Secondary | ICD-10-CM | POA: Diagnosis not present

## 2022-02-05 DIAGNOSIS — Z7982 Long term (current) use of aspirin: Secondary | ICD-10-CM | POA: Diagnosis not present

## 2022-02-05 DIAGNOSIS — K8020 Calculus of gallbladder without cholecystitis without obstruction: Secondary | ICD-10-CM | POA: Diagnosis not present

## 2022-02-05 DIAGNOSIS — I10 Essential (primary) hypertension: Secondary | ICD-10-CM | POA: Diagnosis not present

## 2022-02-05 LAB — CBC WITH DIFFERENTIAL/PLATELET
Abs Immature Granulocytes: 0.03 10*3/uL (ref 0.00–0.07)
Basophils Absolute: 0.2 10*3/uL — ABNORMAL HIGH (ref 0.0–0.1)
Basophils Relative: 1 %
Eosinophils Absolute: 0.8 10*3/uL — ABNORMAL HIGH (ref 0.0–0.5)
Eosinophils Relative: 5 %
HCT: 35.5 % — ABNORMAL LOW (ref 36.0–46.0)
Hemoglobin: 10.4 g/dL — ABNORMAL LOW (ref 12.0–15.0)
Immature Granulocytes: 0 %
Lymphocytes Relative: 35 %
Lymphs Abs: 5.2 10*3/uL — ABNORMAL HIGH (ref 0.7–4.0)
MCH: 24.9 pg — ABNORMAL LOW (ref 26.0–34.0)
MCHC: 29.3 g/dL — ABNORMAL LOW (ref 30.0–36.0)
MCV: 85.1 fL (ref 80.0–100.0)
Monocytes Absolute: 1.4 10*3/uL — ABNORMAL HIGH (ref 0.1–1.0)
Monocytes Relative: 9 %
Neutro Abs: 7.6 10*3/uL (ref 1.7–7.7)
Neutrophils Relative %: 50 %
Platelets: 406 10*3/uL — ABNORMAL HIGH (ref 150–400)
RBC: 4.17 MIL/uL (ref 3.87–5.11)
RDW: 15.6 % — ABNORMAL HIGH (ref 11.5–15.5)
WBC: 15 10*3/uL — ABNORMAL HIGH (ref 4.0–10.5)
nRBC: 0 % (ref 0.0–0.2)

## 2022-02-05 LAB — URINALYSIS, ROUTINE W REFLEX MICROSCOPIC
Bacteria, UA: NONE SEEN
Bilirubin Urine: NEGATIVE
Glucose, UA: NEGATIVE mg/dL
Hgb urine dipstick: NEGATIVE
Ketones, ur: NEGATIVE mg/dL
Nitrite: NEGATIVE
Protein, ur: NEGATIVE mg/dL
Specific Gravity, Urine: 1.021 (ref 1.005–1.030)
pH: 5 (ref 5.0–8.0)

## 2022-02-05 LAB — BASIC METABOLIC PANEL
Anion gap: 13 (ref 5–15)
BUN: 28 mg/dL — ABNORMAL HIGH (ref 8–23)
CO2: 22 mmol/L (ref 22–32)
Calcium: 10 mg/dL (ref 8.9–10.3)
Chloride: 107 mmol/L (ref 98–111)
Creatinine, Ser: 1.41 mg/dL — ABNORMAL HIGH (ref 0.44–1.00)
GFR, Estimated: 39 mL/min — ABNORMAL LOW (ref >60)
Glucose, Bld: 113 mg/dL — ABNORMAL HIGH (ref 70–99)
Potassium: 4 mmol/L (ref 3.5–5.1)
Sodium: 142 mmol/L (ref 135–145)

## 2022-02-05 MED ORDER — TAMSULOSIN HCL 0.4 MG PO CAPS: 0.4000 mg | ORAL_CAPSULE | Freq: Every day | ORAL | 0 refills | Status: DC

## 2022-02-05 MED ORDER — OXYCODONE-ACETAMINOPHEN 5-325 MG PO TABS: 1.0000 | ORAL_TABLET | ORAL | 0 refills | Status: DC | PRN

## 2022-02-05 MED ORDER — ONDANSETRON 4 MG PO TBDP: 4.0000 mg | ORAL_TABLET | Freq: Three times a day (TID) | ORAL | 0 refills | Status: DC | PRN

## 2022-02-05 NOTE — Discharge Instructions (Signed)
Take the prescribed medication as directed.  Do not drive or make important decisions while taking pain medication. ?Follow-up with urology-- can call in the morning to get appt scheduled. ?Return to the ED for new or worsening symptoms-- fever, trouble urinating, uncontrolled pain, vomiting etc. ?

## 2022-02-05 NOTE — ED Triage Notes (Signed)
Pt came in with R side pain. Pt has hx of kidney stones. Ongoing for 3 days. No groin or flank pain ?

## 2022-02-05 NOTE — ED Provider Triage Note (Signed)
Emergency Medicine Provider Triage Evaluation Note ? ?[name] , a 76 y.o. female  was evaluated in triage.  Pt complains of right side/flank pain.  States she feels like she has a kidney stone-- hx of same.  Mild pain over the past 2 days but worse today. ? ?Review of Systems  ?Positive: Right side/flank pain ?Negative: fever ? ?Physical Exam  ?BP (!) 212/99 (BP Location: Left Arm)   Pulse 97   Temp 97.8 ?F (36.6 ?C) (Oral)   Resp 16   SpO2 98%  ?Gen:   Awake, no distress   ?Resp:  Normal effort  ?MSK:   Moves extremities without difficulty  ?Other:  Non-tender RUQ/epigastrium, endorses pain along right lateral abdomen/flank ? ?Medical Decision Making  ?Medically screening exam initiated at 1:10 AM.  Appropriate orders placed.  Evyn Kooyman Lange was informed that the remainder of the evaluation will be completed by another provider, this initial triage assessment does not replace that evaluation, and the importance of remaining in the ED until their evaluation is complete. ? ?Right flank pain.  Hx of stones with similar.  Labs, UA, renal stone study. ?  ?Garlon Hatchet, PA-C ?02/05/22 0112 ? ?

## 2022-02-05 NOTE — ED Provider Notes (Signed)
[facility] EMERGENCY DEPARTMENT Provider Note   CSN: [id number] Arrival date & time: 02/05/22  0055     History  Chief Complaint  Patient presents with   Abdominal Pain    Tiffany House is a 76 y.o. female.  The history is provided by the patient and medical records.  Abdominal Pain  76 year old female with history of arthritis, GERD, hyperlipidemia, hypothyroidism, kidney stones, presenting to the ED with right flank pain.  States some mild pain for 3 days but worse tonight.  Pain localized to right lateral abdomen.  She denies any difficulty urinating or hematuria.  No nausea or vomiting.  No fever or chills.  History of kidney stones in the past with similar symptoms and is concerned for same.  Has been taking Tylenol prior to arrival without much relief.  Home Medications Prior to Admission medications   Medication Sig Start Date End Date Taking? Authorizing Provider  ondansetron (ZOFRAN-ODT) 4 MG disintegrating tablet Take 1 tablet (4 mg total) by mouth every 8 (eight) hours as needed for nausea. 02/05/22  Yes Garlon Hatchet, PA-C  oxyCODONE-acetaminophen (PERCOCET) 5-325 MG tablet Take 1 tablet by mouth every 4 (four) hours as needed. 02/05/22  Yes Garlon Hatchet, PA-C  tamsulosin (FLOMAX) 0.4 MG CAPS capsule Take 1 capsule (0.4 mg total) by mouth daily after supper. 02/05/22  Yes Garlon Hatchet, PA-C  acetaminophen (TYLENOL) 325 MG tablet Take 650 mg by mouth See admin instructions. Take 650 mg by mouth at bedtime and an additional 650 mg every 6 hours as needed for pain    [provider]  aspirin 81 MG chewable tablet Chew 1 tablet (81 mg total) by mouth daily. 03/15/21   Reva Bores, MD  calcium carbonate (OS-CAL - DOSED IN MG OF ELEMENTAL CALCIUM) 1250 (500 Ca) MG tablet Take 1 tablet by mouth 2 (two) times daily with a meal.    [provider]  Cholecalciferol (VITAMIN D-3) 1000 UNITS CAPS Take 1,000 Units by mouth in the morning and  at bedtime.    [provider]  diclofenac sodium (VOLTAREN) 1 % GEL Apply 2 g topically 4 (four) times daily. Patient taking differently: Apply 2 g topically 4 (four) times daily as needed (for back or knee pain). 12/07/18   Myrlene Broker, MD  fish oil-omega-3 fatty acids 1000 MG capsule Take 1 g by mouth every evening.    [provider]  levothyroxine (SYNTHROID) 25 MCG tablet Take 1 tablet (25 mcg total) by mouth daily. 03/09/21   Myrlene Broker, MD  Magnesium Hydroxide (MAGNESIA PO) Take by mouth daily.    [provider]  Multiple Vitamin (MULTIVITAMIN) tablet Take 1 tablet by mouth daily.    [provider]  multivitamin-lutein (OCUVITE-LUTEIN) CAPS capsule Take 1 capsule by mouth daily.    [provider]  omeprazole (PRILOSEC) 40 MG capsule TAKE ONE CAPSULE BY MOUTH DAILY 06/22/21   Myrlene Broker, MD  PARoxetine (PAXIL) 10 MG tablet Take 1 tablet (10 mg total) by mouth daily. 03/09/21   Myrlene Broker, MD  simvastatin (ZOCOR) 40 MG tablet Take 1 tablet (40 mg total) by mouth daily at 6 PM. 03/14/21   Reva Bores, MD      Allergies    Shellfish allergy and Sulfonamide derivatives    Review of Systems   Review of Systems  Gastrointestinal:  Positive for abdominal pain.  Genitourinary:  Positive for flank pain.  All other  systems reviewed and are negative.  Physical Exam Updated Vital Signs BP (!) 182/89 (BP Location: Left Arm)    Pulse 84    Temp 97.8 F (36.6 C) (Oral)    Resp 17    SpO2 94%   Physical Exam Vitals and nursing note reviewed.  Constitutional:      Appearance: She is well-developed.  HENT:     Head: Normocephalic and atraumatic.  Eyes:     Conjunctiva/sclera: Conjunctivae normal.     Pupils: Pupils are equal, round, and reactive to light.  Cardiovascular:     Rate and Rhythm: Normal rate and regular rhythm.     Heart sounds: Normal heart sounds.  Pulmonary:     Effort: Pulmonary  effort is normal.     Breath sounds: Normal breath sounds.  Abdominal:     Palpations: Abdomen is soft.     Tenderness: There is abdominal tenderness.     Comments: Pain along right lateral abdomen, there is no deformity or swelling, normal bowel sounds  Musculoskeletal:        General: Normal range of motion.     Cervical back: Normal range of motion.  Skin:    General: Skin is warm and dry.  Neurological:     Mental Status: She is alert and oriented to person, place, and time.    ED Results / Procedures / Treatments   Labs (all labs ordered are listed, but only abnormal results are displayed) Labs Reviewed  CBC WITH DIFFERENTIAL/PLATELET - Abnormal; Notable for the following components:      Result Value   WBC 15.0 (*)    Hemoglobin 10.4 (*)    HCT 35.5 (*)    MCH 24.9 (*)    MCHC 29.3 (*)    RDW 15.6 (*)    Platelets 406 (*)    Lymphs Abs 5.2 (*)    Monocytes Absolute 1.4 (*)    Eosinophils Absolute 0.8 (*)    Basophils Absolute 0.2 (*)    All other components within normal limits  BASIC METABOLIC PANEL - Abnormal; Notable for the following components:   Glucose, Bld 113 (*)    BUN 28 (*)    Creatinine, Ser 1.41 (*)    GFR, Estimated 39 (*)    All other components within normal limits  URINALYSIS, ROUTINE W REFLEX MICROSCOPIC - Abnormal; Notable for the following components:   APPearance HAZY (*)    Leukocytes,Ua LARGE (*)    All other components within normal limits  URINE CULTURE    EKG None  Radiology CT Renal Stone Study  Result Date: 02/05/2022 CLINICAL DATA:  Flank pain. EXAM: CT ABDOMEN AND PELVIS WITHOUT CONTRAST TECHNIQUE: Multidetector CT imaging of the abdomen and pelvis was performed following the standard protocol without IV contrast. RADIATION DOSE REDUCTION: This exam was performed according to the departmental dose-optimization program which includes automated exposure control, adjustment of the mA and/or kV according to patient size and/or use  of iterative reconstruction technique. COMPARISON:  None. FINDINGS: Lower chest: No acute abnormality. Hepatobiliary: No focal liver abnormality is seen. Tiny gallstones are seen within the gallbladder lumen without evidence of gallbladder wall thickening, pericholecystic inflammation or biliary dilatation. Pancreas: Unremarkable. No pancreatic ductal dilatation or surrounding inflammatory changes. Spleen: Normal in size without focal abnormality. Adrenals/Urinary Tract: Adrenal glands are unremarkable. Kidneys are normal in size. Parapelvic renal cysts are seen within the left kidney. A 5 mm obstructing renal calculus is seen within the distal right ureter with marked severity  right-sided hydronephrosis and hydroureter. Right-sided perinephric inflammatory fat stranding is also seen. Bladder is unremarkable. Stomach/Bowel: There is a large gastric hernia. Appendix appears normal. No evidence of bowel wall thickening, distention, or inflammatory changes. Noninflamed diverticula are seen throughout the large bowel. Vascular/Lymphatic: Aortic atherosclerosis. No enlarged abdominal or pelvic lymph nodes. Reproductive: Uterus and bilateral adnexa are unremarkable. Other: No abdominal wall hernia or abnormality. No abdominopelvic ascites. Musculoskeletal: No acute or significant osseous findings. IMPRESSION: 1. 5 mm obstructing renal calculus within the distal right ureter. 2. Large gastric hernia. 3. Cholelithiasis. 4. Colonic diverticulosis. 5. Aortic atherosclerosis. Aortic Atherosclerosis (ICD10-I70.0). Electronically Signed   By: Aram Candela M.D.   On: 02/05/2022 02:53    Procedures Procedures    Medications Ordered in ED Medications - No data to display  ED Course/ Medical Decision Making/ A&P                           Medical Decision Making Amount and/or Complexity of Data Reviewed Labs: ordered. Radiology: ordered and independent interpretation performed. ECG/medicine tests: ordered and  independent interpretation performed.  Risk Prescription drug management.   76 year old female presenting to the ED with right flank pain.  Pain over the past 3 days but worse tonight.  She is afebrile and nontoxic.  Does have some mild tenderness to the right lateral abdomen but no peritoneal signs.  History of stones with similar so suspicion for same.  Labs overall reassuring.  No leukocytosis.  UA with leukocytes but no bacteria seen.  Will send for culture. CT with 64mm calculus right distal ureter.  Likely source of pain.  Feel she is stable for discharge home with symptomatic care and close urology follow-up.  Return here for any new/acute changes.  Final Clinical Impression(s) / ED Diagnoses Final diagnoses:  Right ureteral stone    Rx / DC Orders ED Discharge Orders          Ordered    oxyCODONE-acetaminophen (PERCOCET) 5-325 MG tablet  Every 4 hours PRN        02/05/22 0328    tamsulosin (FLOMAX) 0.4 MG CAPS capsule  Daily after supper        02/05/22 0328    ondansetron (ZOFRAN-ODT) 4 MG disintegrating tablet  Every 8 hours PRN        02/05/22 0328              Garlon Hatchet, PA-C 02/05/22 0334    Mesner, Barbara Cower, MD 02/05/22 [phone]

## 2022-02-08 LAB — URINE CULTURE: Culture: 10000 — AB

## 2022-02-09 ENCOUNTER — Telehealth: Payer: Self-pay | Admitting: *Deleted

## 2022-02-09 NOTE — Telephone Encounter (Signed)
Post ED Visit - Positive Culture Follow-up ? ?Culture report reviewed by antimicrobial stewardship pharmacist: ?[facility] ?[]  , Pharm.D. ?[]  Enzo Bi, Pharm.D., BCPS AQ-ID ?[]  , Pharm.D., BCPS ?[]  Celedonio Miyamoto, Pharm.D., BCPS ?[]  [place], Garvin Fila.D., BCPS, AAHIVP ?[]  , Pharm.D., BCPS, AAHIVP ?[]  Georgina Pillion, PharmD, BCPS ?[]  , PharmD, BCPS ?[]  [place], PharmD, BCPS ?[]  [address], PharmD ?[]  , PharmD, BCPS ?[]  Estella Husk, PharmD ? ? [facility] ?[]  Lysle Pearl, PharmD ?[]  , PharmD ?[]  Phillips Climes, PharmD ?[]  , Rph ?[]  Agapito Games) , PharmD ?[]  Verlan Friends, PharmD ?[]  , PharmD ?[]  Mervyn Gay, PharmD ?[]  , PharmD ?[]  Vinnie Level, PharmD ?[]  Gerri Spore, PharmD ?[]  , PharmD ?[]  Len Childs, PharmD ? ? ?Positive urine culture ?Attempted symptom check with no answer and no voicemail.  No further patient follow-up is required at this time. ? ? ?02/09/2022, 10:40 AM ?  ?

## 2022-02-15 ENCOUNTER — Ambulatory Visit (INDEPENDENT_AMBULATORY_CARE_PROVIDER_SITE_OTHER): Payer: Medicare HMO

## 2022-02-15 DIAGNOSIS — I639 Cerebral infarction, unspecified: Secondary | ICD-10-CM | POA: Diagnosis not present

## 2022-02-15 LAB — CUP PACEART REMOTE DEVICE CHECK
Date Time Interrogation Session: 20230319231206
Implantable Pulse Generator Implant Date: 20220509

## 2022-02-24 NOTE — Progress Notes (Signed)
Carelink Summary Report / Loop Recorder 

## 2022-02-25 DIAGNOSIS — R8279 Other abnormal findings on microbiological examination of urine: Secondary | ICD-10-CM | POA: Diagnosis not present

## 2022-02-25 DIAGNOSIS — N201 Calculus of ureter: Secondary | ICD-10-CM | POA: Diagnosis not present

## 2022-03-22 ENCOUNTER — Ambulatory Visit (INDEPENDENT_AMBULATORY_CARE_PROVIDER_SITE_OTHER): Payer: Medicare HMO

## 2022-03-22 DIAGNOSIS — I639 Cerebral infarction, unspecified: Secondary | ICD-10-CM

## 2022-03-22 LAB — CUP PACEART REMOTE DEVICE CHECK
Date Time Interrogation Session: 20230421230416
Implantable Pulse Generator Implant Date: 20220509

## 2022-03-28 ENCOUNTER — Other Ambulatory Visit: Payer: Self-pay | Admitting: Internal Medicine

## 2022-03-30 ENCOUNTER — Encounter: Payer: Self-pay | Admitting: Internal Medicine

## 2022-03-30 ENCOUNTER — Ambulatory Visit (INDEPENDENT_AMBULATORY_CARE_PROVIDER_SITE_OTHER): Payer: Medicare HMO | Admitting: Internal Medicine

## 2022-03-30 VITALS — BP 126/80 | HR 85 | Temp 97.8°F | Resp 18 | Ht 59.0 in | Wt 144.2 lb

## 2022-03-30 DIAGNOSIS — R4701 Aphasia: Secondary | ICD-10-CM | POA: Diagnosis not present

## 2022-03-30 DIAGNOSIS — K219 Gastro-esophageal reflux disease without esophagitis: Secondary | ICD-10-CM

## 2022-03-30 DIAGNOSIS — R350 Frequency of micturition: Secondary | ICD-10-CM

## 2022-03-30 DIAGNOSIS — R69 Illness, unspecified: Secondary | ICD-10-CM | POA: Diagnosis not present

## 2022-03-30 DIAGNOSIS — E039 Hypothyroidism, unspecified: Secondary | ICD-10-CM | POA: Diagnosis not present

## 2022-03-30 DIAGNOSIS — E782 Mixed hyperlipidemia: Secondary | ICD-10-CM

## 2022-03-30 DIAGNOSIS — M816 Localized osteoporosis [Lequesne]: Secondary | ICD-10-CM

## 2022-03-30 DIAGNOSIS — Z8673 Personal history of transient ischemic attack (TIA), and cerebral infarction without residual deficits: Secondary | ICD-10-CM | POA: Diagnosis not present

## 2022-03-30 DIAGNOSIS — F4321 Adjustment disorder with depressed mood: Secondary | ICD-10-CM

## 2022-03-30 DIAGNOSIS — L659 Nonscarring hair loss, unspecified: Secondary | ICD-10-CM | POA: Diagnosis not present

## 2022-03-30 DIAGNOSIS — Z0000 Encounter for general adult medical examination without abnormal findings: Secondary | ICD-10-CM | POA: Diagnosis not present

## 2022-03-30 LAB — LIPID PANEL
Cholesterol: 184 mg/dL (ref 0–200)
HDL: 49.1 mg/dL (ref >39.00)
LDL Cholesterol: 108 mg/dL — ABNORMAL HIGH (ref 0–99)
NonHDL: 134.44
Total CHOL/HDL Ratio: 4
Triglycerides: 132 mg/dL (ref 0.0–149.0)
VLDL: 26.4 mg/dL (ref 0.0–40.0)

## 2022-03-30 LAB — COMPREHENSIVE METABOLIC PANEL
ALT: 14 U/L (ref 0–35)
AST: 22 U/L (ref 0–37)
Albumin: 4.4 g/dL (ref 3.5–5.2)
Alkaline Phosphatase: 71 U/L (ref 39–117)
BUN: 24 mg/dL — ABNORMAL HIGH (ref 6–23)
CO2: 26 mEq/L (ref 19–32)
Calcium: 9.6 mg/dL (ref 8.4–10.5)
Chloride: 105 mEq/L (ref 96–112)
Creatinine, Ser: 1.52 mg/dL — ABNORMAL HIGH (ref 0.40–1.20)
GFR: 32.97 mL/min — ABNORMAL LOW (ref >60.00)
Glucose, Bld: 97 mg/dL (ref 70–99)
Potassium: 3.7 mEq/L (ref 3.5–5.1)
Sodium: 141 mEq/L (ref 135–145)
Total Bilirubin: 0.4 mg/dL (ref 0.2–1.2)
Total Protein: 7.2 g/dL (ref 6.0–8.3)

## 2022-03-30 LAB — VITAMIN B12: Vitamin B-12: 606 pg/mL (ref 211–911)

## 2022-03-30 LAB — POCT URINALYSIS DIPSTICK
Bilirubin, UA: NEGATIVE
Blood, UA: NEGATIVE
Glucose, UA: NEGATIVE
Ketones, UA: NEGATIVE
Leukocytes, UA: NEGATIVE
Nitrite, UA: NEGATIVE
Protein, UA: POSITIVE — AB
Spec Grav, UA: 1.02 (ref 1.010–1.025)
Urobilinogen, UA: 0.2 E.U./dL
pH, UA: 6 (ref 5.0–8.0)

## 2022-03-30 LAB — CBC
HCT: 31.4 % — ABNORMAL LOW (ref 36.0–46.0)
Hemoglobin: 10 g/dL — ABNORMAL LOW (ref 12.0–15.0)
MCHC: 32 g/dL (ref 30.0–36.0)
MCV: 79.5 fl (ref 78.0–100.0)
Platelets: 333 10*3/uL (ref 150.0–400.0)
RBC: 3.95 Mil/uL (ref 3.87–5.11)
RDW: 15.9 % — ABNORMAL HIGH (ref 11.5–15.5)
WBC: 10.7 10*3/uL — ABNORMAL HIGH (ref 4.0–10.5)

## 2022-03-30 LAB — VITAMIN D 25 HYDROXY (VIT D DEFICIENCY, FRACTURES): VITD: 72.8 ng/mL (ref 30.00–100.00)

## 2022-03-30 LAB — TSH: TSH: 5.94 u[IU]/mL — ABNORMAL HIGH (ref 0.35–5.50)

## 2022-03-30 LAB — HEMOGLOBIN A1C: Hgb A1c MFr Bld: 6 % (ref 4.6–6.5)

## 2022-03-30 MED ORDER — CEPHALEXIN 500 MG PO CAPS: 500.0000 mg | ORAL_CAPSULE | Freq: Two times a day (BID) | ORAL | 0 refills | Status: AC

## 2022-03-30 MED ORDER — PAROXETINE HCL 10 MG PO TABS: 10.0000 mg | ORAL_TABLET | Freq: Every day | ORAL | 3 refills | Status: DC

## 2022-03-30 MED ORDER — SIMVASTATIN 40 MG PO TABS
40.0000 mg | ORAL_TABLET | Freq: Every day | ORAL | 3 refills | Status: DC
Start: 2022-03-30 — End: 2023-04-05

## 2022-03-30 MED ORDER — LEVOTHYROXINE SODIUM 25 MCG PO TABS: 25.0000 ug | ORAL_TABLET | Freq: Every day | ORAL | 3 refills | Status: DC

## 2022-03-30 NOTE — Assessment & Plan Note (Signed)
With recent kidney stone passed likely UTI. POC U/A done in office without signs of infection. She did have positive culture she was not treated for back in March. Rx keflex 5 day course.  ?

## 2022-03-30 NOTE — Assessment & Plan Note (Signed)
Checking vitamin D and calcium level (CMP) and adjust as needed. DEXA due 2023 end of year. ?

## 2022-03-30 NOTE — Patient Instructions (Addendum)
We have sent in keflex to take 1 pill twice a day for 5 days.  We will check the labs today. 

## 2022-03-30 NOTE — Assessment & Plan Note (Signed)
Taking omeprazole 40 mg daily and has good control. Will refill as needed and continue. ?

## 2022-03-30 NOTE — Assessment & Plan Note (Signed)
Checking thyroid levels as well as vitamin D and B12 to help rule out deficiency causing the hair thinning/loss. Treat as appropriate.  ?

## 2022-03-30 NOTE — Assessment & Plan Note (Signed)
Improving but still present at times.  

## 2022-03-30 NOTE — Assessment & Plan Note (Signed)
Needs lipid panel and HgA1c for screening. Taking simvastatin 40 mg daily and daily aspirin 81 mg otc which she is advised to continue. No new stroke symptoms and is still having mild improvements. Some word finding difficulty and trouble with mental math which she used to be able to do easily. Balance and strength is normal.  ?

## 2022-03-30 NOTE — Assessment & Plan Note (Signed)
Flu shot yearly. Covid-19 counseled. Pneumonia complete. Shingrix counseled to get at pharmacy. Tetanus due 2032. Cologuard last done 2020 age out of further. Mammogram aged out still does, pap smear aged out and dexa due 2023. Counseled about sun safety and mole surveillance. Counseled about the dangers of distracted driving. Given 10 year screening recommendations.  ? ?

## 2022-03-30 NOTE — Assessment & Plan Note (Signed)
Is doing better, still taking paxil 10 mg daily and doing well. Wishes to continue. Will remain on current dosing.  ?

## 2022-03-30 NOTE — Progress Notes (Signed)
? ?Subjective:  ? ?Patient ID: Tiffany House, female    DOB: Oct 28, 1946, 76 y.o.   MRN: [id number] ? ?HPI ?The patient is a 76 YO female coming in for possible UTI. ? ?Here for medicare wellness and physical, with new complaints. Please see A/P for status and treatment of chronic medical problems.  ? ?Diet: heart healthy ?Physical activity: sedentary ?Depression/mood screen: negative ?Hearing: intact to whispered voice with bilateral aids ?Visual acuity: grossly normal with lens, performs annual eye exam  ?ADLs: capable ?Fall risk: none ?Home safety: good ?Cognitive evaluation: intact to orientation, naming, recall and repetition ?EOL planning: adv directives discussed, in place ? ?Flowsheet Row Office Visit from 03/30/2022 in [facility] [place]  ?PHQ-2 Total Score 0  ? ?  ?  ?Flowsheet Row Office Visit from 03/30/2022 in [facility] [place]  ?PHQ-9 Total Score 1  ? ?  ? ? ?  03/14/2021  ?  8:00 AM 05/15/2021  ?  3:36 PM 07/02/2021  ?  2:42 PM 02/05/2022  ?  1:11 AM 03/30/2022  ?  9:26 AM  ?Fall Risk  ?Falls in the past year?  1 0  0  ?Was there an injury with Fall?  0   0  ?Fall Risk Category Calculator  1   0  ?Fall Risk Category  Low   Low  ?Patient Fall Risk Level Low fall risk Low fall risk  Low fall risk   ?Patient at Risk for Falls Due to  No Fall Risks     ?Fall risk Follow up  Falls evaluation completed     ? ? ?I have personally reviewed and have noted ?1. The patient's medical and social history - reviewed today no changes ?2. Their use of alcohol, tobacco or illicit drugs ?3. Their current medications and supplements ?4. The patient's functional ability including ADL's, fall risks, home safety risks and hearing or visual impairment. ?5. Diet and physical activities ?6. Evidence for depression or mood disorders ?7. Care team reviewed and updated ?8.  The patient is not on an opioid pain medication. ? ?Patient Care Team: ?Myrlene Broker, MD as PCP - General (Internal  Medicine) ?Valeria Batman, MD as Consulting Physician (Orthopedic Surgery) ?Serena Colonel, MD (Otolaryngology) ?Bjorn Pippin, MD (Urology) ?Enid Baas, MD (Family Medicine) ?Santiago Glad, MD (Specialist) ?Antoine Primas, MD (Inactive) as Resident ?Regan Lemming, MD as Consulting Physician (Cardiology) ?Ihor Austin, NP as Nurse Practitioner (Neurology) ?Past Medical History:  ?Diagnosis Date  ? Allergic rhinitis, cause unspecified   ? Arthritis   ? Benign positional vertigo   ? GERD   ? HYPERLIPIDEMIA   ? Hypertension   ? HYPOTHYROIDISM dx 10/2010  ? Low back pain   ? MRI 02/2011  ? Migraines   ? OSTEOPENIA   ? Osteoporosis   ? Qualifier: Diagnosis of  By: Nena Jordan   ? RENAL CALCULUS, RIGHT   ? L 01/2012  ? ?Past Surgical History:  ?Procedure Laterality Date  ? HAND SURGERY  2001  ? Dr Teressa Senter-- surgery of right hand to rebuild joint  ? HERNIA REPAIR  03/2016  ? Kidney stone removed  01/12/10  ? TUBAL LIGATION  1979  ? ?Family History  ?Problem Relation Age of Onset  ? Hyperlipidemia Mother   ? Hypertension Mother   ? Cancer Father   ?     prostate. [name]  ? Coronary artery disease Father   ? Hypertension Father   ? ? ? ?  Review of Systems  ?Constitutional: Negative.   ?HENT: Negative.    ?Eyes: Negative.   ?Respiratory:  Negative for cough, chest tightness and shortness of breath.   ?Cardiovascular:  Negative for chest pain, palpitations and leg swelling.  ?Gastrointestinal:  Negative for abdominal distention, abdominal pain, constipation, diarrhea, nausea and vomiting.  ?Genitourinary:  Positive for dysuria, frequency and urgency.  ?Musculoskeletal: Negative.   ?Skin: Negative.   ?Neurological: Negative.   ?Psychiatric/Behavioral: Negative.    ? ?Objective:  ?Physical Exam ?Constitutional:   ?   Appearance: She is well-developed.  ?HENT:  ?   Head: Normocephalic and atraumatic.  ?Cardiovascular:  ?   Rate and Rhythm: Normal rate and regular rhythm.  ?Pulmonary:  ?   Effort: Pulmonary  effort is normal. No respiratory distress.  ?   Breath sounds: Normal breath sounds. No wheezing or rales.  ?Abdominal:  ?   General: Bowel sounds are normal. There is no distension.  ?   Palpations: Abdomen is soft.  ?   Tenderness: There is no abdominal tenderness. There is no rebound.  ?Musculoskeletal:  ?   Cervical back: Normal range of motion.  ?Skin: ?   General: Skin is warm and dry.  ?Neurological:  ?   Mental Status: She is alert and oriented to person, place, and time.  ?   Coordination: Coordination normal.  ? ? ?Vitals:  ? 03/30/22 0917  ?BP: 126/80  ?Pulse: 85  ?Resp: 18  ?Temp: 97.8 ?F (36.6 ?C)  ?TempSrc: Oral  ?SpO2: 98%  ?Weight: 144 lb 3.2 oz (65.4 kg)  ?Height: 4\' 11"  (1.499 m)  ? ? ?This visit occurred during the SARS-CoV-2 public health emergency.  Safety protocols were in place, including screening questions prior to the visit, additional usage of staff PPE, and extensive cleaning of exam room while observing appropriate contact time as indicated for disinfecting solutions.  ? ?Assessment & Plan:  ? ?

## 2022-04-02 ENCOUNTER — Other Ambulatory Visit: Payer: Self-pay | Admitting: Internal Medicine

## 2022-04-02 DIAGNOSIS — Z8673 Personal history of transient ischemic attack (TIA), and cerebral infarction without residual deficits: Secondary | ICD-10-CM

## 2022-04-06 NOTE — Progress Notes (Signed)
Carelink Summary Report / Loop Recorder 

## 2022-04-12 ENCOUNTER — Other Ambulatory Visit: Payer: Self-pay | Admitting: Internal Medicine

## 2022-04-12 DIAGNOSIS — Z8673 Personal history of transient ischemic attack (TIA), and cerebral infarction without residual deficits: Secondary | ICD-10-CM

## 2022-04-13 ENCOUNTER — Encounter: Payer: Self-pay | Admitting: Internal Medicine

## 2022-04-13 ENCOUNTER — Ambulatory Visit (INDEPENDENT_AMBULATORY_CARE_PROVIDER_SITE_OTHER): Payer: Medicare HMO | Admitting: Internal Medicine

## 2022-04-13 DIAGNOSIS — Z8673 Personal history of transient ischemic attack (TIA), and cerebral infarction without residual deficits: Secondary | ICD-10-CM | POA: Diagnosis not present

## 2022-04-13 DIAGNOSIS — R350 Frequency of micturition: Secondary | ICD-10-CM

## 2022-04-13 LAB — COMPREHENSIVE METABOLIC PANEL
ALT: 13 U/L (ref 0–35)
AST: 17 U/L (ref 0–37)
Albumin: 4.5 g/dL (ref 3.5–5.2)
Alkaline Phosphatase: 69 U/L (ref 39–117)
BUN: 22 mg/dL (ref 6–23)
CO2: 24 mEq/L (ref 19–32)
Calcium: 9.8 mg/dL (ref 8.4–10.5)
Chloride: 106 mEq/L (ref 96–112)
Creatinine, Ser: 1.29 mg/dL — ABNORMAL HIGH (ref 0.40–1.20)
GFR: 40.13 mL/min — ABNORMAL LOW (ref >60.00)
Glucose, Bld: 92 mg/dL (ref 70–99)
Potassium: 3.7 mEq/L (ref 3.5–5.1)
Sodium: 141 mEq/L (ref 135–145)
Total Bilirubin: 0.5 mg/dL (ref 0.2–1.2)
Total Protein: 7.5 g/dL (ref 6.0–8.3)

## 2022-04-13 MED ORDER — CIPROFLOXACIN HCL 500 MG PO TABS: 500.0000 mg | ORAL_TABLET | Freq: Two times a day (BID) | ORAL | 0 refills | Status: AC

## 2022-04-13 MED ORDER — LEVOTHYROXINE SODIUM 25 MCG PO TABS: 25.0000 ug | ORAL_TABLET | Freq: Every day | ORAL | 3 refills | Status: DC

## 2022-04-13 NOTE — Progress Notes (Signed)
   Subjective:   Patient ID: Tiffany House, female    DOB: Jun 13, 1946, 76 y.o.   MRN: [id number]  HPI The patient is a 76 YO female coming in for UTI symptoms. Recent kidney stone. Finished antibiotics and symptoms recurred within 1 week. Traveling soon and wishes to have no symptoms on trip.  Review of Systems  Constitutional: Negative.   Respiratory: Negative.    Cardiovascular: Negative.   Gastrointestinal:  Positive for abdominal pain. Negative for abdominal distention, constipation, diarrhea, nausea and vomiting.  Genitourinary:  Positive for dysuria, frequency and urgency.  Musculoskeletal: Negative.   Skin: Negative.    Objective:  Physical Exam Constitutional:      Appearance: She is well-developed.  HENT:     Head: Normocephalic and atraumatic.  Cardiovascular:     Rate and Rhythm: Normal rate and regular rhythm.  Pulmonary:     Effort: Pulmonary effort is normal. No respiratory distress.     Breath sounds: Normal breath sounds. No wheezing or rales.  Abdominal:     General: Bowel sounds are normal. There is no distension.     Palpations: Abdomen is soft.     Tenderness: There is abdominal tenderness. There is no rebound.  Musculoskeletal:     Cervical back: Normal range of motion.  Skin:    General: Skin is warm and dry.  Neurological:     Mental Status: She is alert and oriented to person, place, and time.     Coordination: Coordination normal.    Vitals:   04/13/22 1447  BP: 122/80  Pulse: 98  Resp: 18  SpO2: 98%  Weight: 140 lb (63.5 kg)  Height: 4\' 11"  (1.499 m)    Assessment & Plan:

## 2022-04-13 NOTE — Patient Instructions (Signed)
We have sent in the cipro to take 1 pill twice a day for 1 week. We have sent in a 2 week prescription so you have the other week if needed.  ?

## 2022-04-15 ENCOUNTER — Encounter: Payer: Self-pay | Admitting: Internal Medicine

## 2022-04-15 NOTE — Assessment & Plan Note (Signed)
Treating for UTI with cipro 1 week, 2 weeks sent in just in case of recurrence on trip she will be remote and no close health care access.

## 2022-04-15 NOTE — Assessment & Plan Note (Signed)
Checking CMP today as she had some change in renal function with hydronephrosis recently with stone. We talked about how this could be change or could resolve in the next few months and will monitor. BP under control.

## 2022-06-15 ENCOUNTER — Other Ambulatory Visit: Payer: Self-pay | Admitting: Internal Medicine

## 2022-07-29 ENCOUNTER — Ambulatory Visit (INDEPENDENT_AMBULATORY_CARE_PROVIDER_SITE_OTHER): Payer: Medicare HMO | Admitting: Emergency Medicine

## 2022-07-29 ENCOUNTER — Encounter: Payer: Self-pay | Admitting: Emergency Medicine

## 2022-07-29 VITALS — BP 136/88 | HR 72 | Temp 98.1°F | Ht 59.0 in | Wt 134.1 lb

## 2022-07-29 DIAGNOSIS — T7840XA Allergy, unspecified, initial encounter: Secondary | ICD-10-CM | POA: Diagnosis not present

## 2022-07-29 DIAGNOSIS — L239 Allergic contact dermatitis, unspecified cause: Secondary | ICD-10-CM | POA: Diagnosis not present

## 2022-07-29 NOTE — Assessment & Plan Note (Signed)
Stable without complications Possible trigger recent shingles vaccine. Recommended Zyrtec 10 mg daily for 3 days. ED precautions given Advised to contact the office if no better or worse during the next several days.

## 2022-07-29 NOTE — Assessment & Plan Note (Signed)
May be related to recent shingles vaccine. Clinically stable.  No complications. No signs or symptoms of anaphylaxis. We will treat with daily oral antihistamine Advised to take Zyrtec 10 mg daily. Advised to contact the office if no better or worse during the next several days.

## 2022-07-29 NOTE — Patient Instructions (Signed)
Start over-the-counter Zyrtec 10 mg daily for 3 days.  Allergies, Adult An allergy means that your body reacts to something that bothers it (allergen). This can happen from something that you eat, breathe in, or touch. Allergies often affect the nose, eyes, skin, and stomach. They can be mild, moderate, or very bad (severe). An allergy cannot spread from person to person. They can happen at any age. Sometimes, people outgrow them. What are the causes? Outdoor things, such as pollen, car fumes, and mold. Indoor things, such as dust, smoke, mold, and pets. Foods. Medicines. Things that bother your skin, such as perfume and bug bites. What increases the risk? Having family members with allergies or asthma. What are the signs or symptoms? Symptoms depend on how bad your allergy is. Mild to moderate symptoms Runny nose, stuffy nose, or sneezing. Itchy mouth, ears, or throat. A feeling of mucus dripping down the back of your throat. Sore throat. Eyes that are itchy, red, watery, or puffy. A skin rash, or red, swollen areas of skin (hives). Stomach cramps or bloating. Severe symptoms Very bad allergies to food, medicine, or bug bites may cause a very bad allergy reaction (anaphylaxis). This can be life-threatening. Symptoms include: A red face. Wheezing or coughing. Swollen lips, tongue, or mouth. Tight or swollen throat. Chest pain or tightness, or a fast heartbeat. Trouble breathing or shortness of breath. Pain in your belly (abdomen), vomiting, or watery poop (diarrhea). Feeling dizzy or fainting. How is this treated?     Treatment for this condition depends on your symptoms. Treatment may include: Cold, wet cloths for itching and swelling. Eye drops, nose sprays, or skin creams. Washing out your nose each day. A humidifier. Medicines. A change to the foods you eat. Being exposed again and again to tiny amounts of allergens. This helps your body get used to them. You might  have: Allergy shots. Very small amounts of allergen put under your tongue. An emergency shot (auto-injector pen) if you have a very bad allergy reaction. This is a medicine with a needle. You can put it into your skin by yourself. Your doctor will teach you how to use it. Follow these instructions at home: Medicines  Take or apply over-the-counter and prescription medicines only as told by your doctor. If you are at risk for a very bad allergy reaction, keep an auto-injector pen with you all the time. Eating and drinking Follow instructions from your doctor about what to eat and drink. Drink enough fluid to keep your pee (urine) pale yellow. General instructions If you have ever had a very bad allergy reaction, wear a medical alert bracelet or necklace. Stay away from things that you are allergic to. Keep all follow-up visits as told by your doctor. This is important. Contact a doctor if: Your symptoms do not get better with treatment. Get help right away if: You have symptoms of a very bad allergy reaction. These include: A swollen mouth, tongue, or throat. Pain or tightness in your chest. Trouble breathing. Being short of breath. Dizziness. Fainting. Very bad pain in your belly. Vomiting. Watery poop. These symptoms may be an emergency. Do not wait to see if the symptoms will go away. Get medical help right away. Call your local emergency services (911 in the [place] Do not drive yourself to the hospital. Summary Take or apply over-the-counter and prescription medicines only as told by your doctor. Stay away from things you are allergic to. If you are at risk for a very  bad allergy reaction, carry an auto-injector pen all the time. Wear a medical alert bracelet or necklace. Very bad allergy reactions can be life-threatening. Get help right away. This information is not intended to replace advice given to you by your health care provider. Make sure you discuss any questions you  have with your health care provider. Document Revised: 09/26/2019 Document Reviewed: 09/26/2019 Elsevier Patient Education  2023 [employer].

## 2022-07-29 NOTE — Progress Notes (Signed)
Tiffany BattlesLeslie S House 76 y.o.   Chief Complaint  Patient presents with   Headache   bumps    Bumps/rash on legs, arms and back pt thinks it is from getting her second shingles vaccine     HISTORY OF PRESENT ILLNESS: This is House 76 y.o. female complaining of small itchy red bumps to arms and legs and back since getting second shingles vaccine last Monday. Also complaining of occasional headaches responsive to Tylenol.  No headache at present time. Denies occultly breathing or any other associated symptoms.  Headache  Pertinent negatives include no abdominal pain, coughing, fever, nausea, sore throat or vomiting.     Prior to Admission medications   Medication Sig Start Date End Date Taking? Authorizing Provider  acetaminophen (TYLENOL) 325 MG tablet Take 650 mg by mouth See admin instructions. Take 650 mg by mouth at bedtime and an additional 650 mg every 6 hours as needed for pain   Yes [provider]  aspirin 81 MG chewable tablet Chew 1 tablet (81 mg total) by mouth daily. 03/15/21  Yes Tiffany House, Tanya S, MD  calcium carbonate (OS-CAL - DOSED IN MG OF ELEMENTAL CALCIUM) 1250 (500 Ca) MG tablet Take 1 tablet by mouth 2 (two) times daily with House meal.   Yes [provider]  Cholecalciferol (VITAMIN D-3) 1000 UNITS CAPS Take 1,000 Units by mouth in the morning and at bedtime.   Yes [provider]  diclofenac sodium (VOLTAREN) 1 % GEL Apply 2 g topically 4 (four) times daily. Patient taking differently: Apply 2 g topically 4 (four) times daily as needed (for back or knee pain). 12/07/18  Yes Myrlene Brokerrawford, Tiffany A, MD  fish oil-omega-3 fatty acids 1000 MG capsule Take 1 g by mouth every evening.   Yes [provider]  levothyroxine (SYNTHROID) 25 MCG tablet Take 1 tablet (25 mcg total) by mouth daily. 04/13/22  Yes Myrlene Brokerrawford, Tiffany A, MD  Magnesium Hydroxide (MAGNESIA PO) Take by mouth daily.   Yes [provider]  Multiple Vitamin (MULTIVITAMIN) tablet  Take 1 tablet by mouth daily.   Yes [provider]  multivitamin-lutein (OCUVITE-LUTEIN) CAPS capsule Take 1 capsule by mouth daily.   Yes [provider]  omeprazole (PRILOSEC) 40 MG capsule TAKE ONE CAPSULE BY MOUTH DAILY 06/16/22  Yes Myrlene Brokerrawford, Tiffany A, MD  PARoxetine (PAXIL) 10 MG tablet Take 1 tablet (10 mg total) by mouth daily. 03/30/22  Yes Myrlene Brokerrawford, Tiffany A, MD  simvastatin (ZOCOR) 40 MG tablet Take 1 tablet (40 mg total) by mouth daily at 6 PM. 03/30/22  Yes Myrlene Brokerrawford, Tiffany A, MD    Allergies  Allergen Reactions   Shellfish Allergy Nausea And Vomiting    Scallops and oysters   Sulfonamide Derivatives Other (See Comments)    Reaction not recalled    Patient Active Problem List   Diagnosis Date Noted   Hair loss 03/30/2022   Frequent urination 03/30/2022   Aphasia 03/12/2021   Hx of completed stroke 03/12/2021   Degenerative arthritis of right knee 12/02/2020   Routine general medical examination at House health care facility 12/08/2018   Presbycusis of both ears 05/03/2018   Adjustment disorder 06/05/2015   Hypothyroidism 11/02/2010   Osteoporosis    Hyperlipidemia 12/23/2008   Gastroesophageal reflux disease 05/21/2008    Past Medical History:  Diagnosis Date   Allergic rhinitis, cause unspecified    Arthritis    Benign positional vertigo    GERD    HYPERLIPIDEMIA    Hypertension  HYPOTHYROIDISM dx 10/2010   Low back pain    MRI 02/2011   Migraines    OSTEOPENIA    Osteoporosis    Qualifier: Diagnosis of  By: Tiffany House    RENAL CALCULUS, RIGHT    L 01/2012    Past Surgical History:  Procedure Laterality Date   HAND SURGERY  2001   Dr Teressa Senter-- surgery of right hand to rebuild joint   HERNIA REPAIR  03/2016   Kidney stone removed  01/12/10   TUBAL LIGATION  1979    Social History   Socioeconomic History   Marital status: Widowed    Spouse name: widow   Number of children: 0   Years of education: Not on file    Highest education level: Not on file  Occupational History   Occupation: Copywriter, advertising: SELF EMPLOYED  Tobacco Use   Smoking status: Never   Smokeless tobacco: Never  Vaping Use   Vaping Use: Never used  Substance and Sexual Activity   Alcohol use: Yes    Comment: occasional   Drug use: No   Sexual activity: Never  Other Topics Concern   Not on file  Social History Narrative   Not on file   Social Determinants of Health   Financial Resource Strain: Low Risk  (02/28/2018)   Overall Financial Resource Strain (CARDIA)    Difficulty of Paying Living Expenses: Not hard at all  Food Insecurity: No Food Insecurity (04/01/2021)   Hunger Vital Sign    Worried About Running Out of Food in the Last Year: Never true    Ran Out of Food in the Last Year: Never true  Transportation Needs: No Transportation Needs (04/01/2021)   PRAPARE - Administrator, Civil Service (Medical): No    Lack of Transportation (Non-Medical): No  Physical Activity: Inactive (02/28/2018)   Exercise Vital Sign    Days of Exercise per Week: 0 days    Minutes of Exercise per Session: 0 min  Stress: No Stress Concern Present (04/01/2021)   [employer] of Occupational Health - Occupational Stress Questionnaire    Feeling of Stress : Not at all  Social Connections: Moderately Integrated (02/28/2018)   Social Connection and Isolation Panel [NHANES]    Frequency of Communication with Friends and Family: More than three times House week    Frequency of Social Gatherings with Friends and Family: More than three times House week    Attends Religious Services: More than 4 times per year    Active Member of [employer] or Organizations: Yes    Attends Banker Meetings: More than 4 times per year    Marital Status: Widowed  Intimate Partner Violence: Not At Risk (04/01/2021)   Humiliation, Afraid, Rape, and Kick questionnaire    Fear of Current or Ex-Partner: No    Emotionally Abused: No    Physically Abused:  No    Sexually Abused: No    Family History  Problem Relation Age of Onset   Hyperlipidemia Mother    Hypertension Mother    Cancer Father        prostate. Tiffany House   Coronary artery disease Father    Hypertension Father      Review of Systems  Constitutional: Negative.  Negative for chills and fever.  HENT: Negative.  Negative for congestion and sore throat.   Respiratory: Negative.  Negative for cough and shortness of breath.   Cardiovascular: Negative.  Negative for chest  pain and palpitations.  Gastrointestinal:  Negative for abdominal pain, diarrhea, nausea and vomiting.  Genitourinary: Negative.   Skin:  Positive for itching and rash.  Neurological:  Positive for headaches.  All other systems reviewed and are negative.  Today's Vitals   07/29/22 1319  BP: 136/88  Pulse: 72  Temp: 98.1 F (36.7 C)  TempSrc: Oral  SpO2: 96%  Weight: 134 lb 2 oz (60.8 kg)  Height: 4\' 11"  (1.499 m)   Body mass index is 27.09 kg/m.   Physical Exam Vitals reviewed.  Constitutional:      Appearance: She is well-developed.  HENT:     Head: Normocephalic.     Mouth/Throat:     Mouth: Mucous membranes are moist.     Pharynx: Oropharynx is clear.  Eyes:     Extraocular Movements: Extraocular movements intact.     Conjunctiva/sclera: Conjunctivae normal.     Pupils: Pupils are equal, round, and reactive to light.  Cardiovascular:     Rate and Rhythm: Normal rate and regular rhythm.     Pulses: Normal pulses.     Heart sounds: Normal heart sounds.  Pulmonary:     Effort: Pulmonary effort is normal.     Breath sounds: Normal breath sounds.  Musculoskeletal:     Cervical back: No tenderness.  Lymphadenopathy:     Cervical: No cervical adenopathy.  Skin:    General: Skin is warm and dry.     Capillary Refill: Capillary refill takes less than 2 seconds.     Findings: Rash present.     Comments: Mild widespread maculopapular rash.  No vasculitis.  Neurological:      General: No focal deficit present.     Mental Status: She is alert and oriented to person, place, and time.  Psychiatric:        Mood and Affect: Mood normal.        Behavior: Behavior normal.      ASSESSMENT & PLAN: House total of 34 minutes was spent with the patient and counseling/coordination of care regarding preparing for this visit, review of most recent office visit notes, review of multiple chronic medical problems and their management, review of all medications, diagnosis of allergic reaction and management, need for oral antihistamine treatment for the next several days, ED precautions, prognosis, documentation, need for follow-up if no better or worse in the next several days.  Problem List Items Addressed This Visit       Musculoskeletal and Integument   Allergic dermatitis - Primary    May be related to recent shingles vaccine. Clinically stable.  No complications. No signs or symptoms of anaphylaxis. We will treat with daily oral antihistamine Advised to take Zyrtec 10 mg daily. Advised to contact the office if no better or worse during the next several days.        Other   Acute allergic reaction    Stable without complications Possible trigger recent shingles vaccine. Recommended Zyrtec 10 mg daily for 3 days. ED precautions given Advised to contact the office if no better or worse during the next several days.      Patient Instructions  Start over-the-counter Zyrtec 10 mg daily for 3 days.  Allergies, Adult An allergy means that your body reacts to something that bothers it (allergen). This can happen from something that you eat, breathe in, or touch. Allergies often affect the nose, eyes, skin, and stomach. They can be mild, moderate, or very bad (severe). An allergy cannot spread from  person to person. They can happen at any age. Sometimes, people outgrow them. What are the causes? Outdoor things, such as pollen, car fumes, and mold. Indoor things, such as  dust, smoke, mold, and pets. Foods. Medicines. Things that bother your skin, such as perfume and bug bites. What increases the risk? Having family members with allergies or asthma. What are the signs or symptoms? Symptoms depend on how bad your allergy is. Mild to moderate symptoms Runny nose, stuffy nose, or sneezing. Itchy mouth, ears, or throat. House feeling of mucus dripping down the back of your throat. Sore throat. Eyes that are itchy, red, watery, or puffy. House skin rash, or red, swollen areas of skin (hives). Stomach cramps or bloating. Severe symptoms Very bad allergies to food, medicine, or bug bites may cause House very bad allergy reaction (anaphylaxis). This can be life-threatening. Symptoms include: House red face. Wheezing or coughing. Swollen lips, tongue, or mouth. Tight or swollen throat. Chest pain or tightness, or House fast heartbeat. Trouble breathing or shortness of breath. Pain in your belly (abdomen), vomiting, or watery poop (diarrhea). Feeling dizzy or fainting. How is this treated?     Treatment for this condition depends on your symptoms. Treatment may include: Cold, wet cloths for itching and swelling. Eye drops, nose sprays, or skin creams. Washing out your nose each day. House humidifier. Medicines. House change to the foods you eat. Being exposed again and again to tiny amounts of allergens. This helps your body get used to them. You might have: Allergy shots. Very small amounts of allergen put under your tongue. An emergency shot (auto-injector pen) if you have House very bad allergy reaction. This is House medicine with House needle. You can put it into your skin by yourself. Your doctor will teach you how to use it. Follow these instructions at home: Medicines  Take or apply over-the-counter and prescription medicines only as told by your doctor. If you are at risk for House very bad allergy reaction, keep an auto-injector pen with you all the time. Eating and  drinking Follow instructions from your doctor about what to eat and drink. Drink enough fluid to keep your pee (urine) pale yellow. General instructions If you have ever had House very bad allergy reaction, wear House medical alert bracelet or necklace. Stay away from things that you are allergic to. Keep all follow-up visits as told by your doctor. This is important. Contact House doctor if: Your symptoms do not get better with treatment. Get help right away if: You have symptoms of House very bad allergy reaction. These include: House swollen mouth, tongue, or throat. Pain or tightness in your chest. Trouble breathing. Being short of breath. Dizziness. Fainting. Very bad pain in your belly. Vomiting. Watery poop. These symptoms may be an emergency. Do not wait to see if the symptoms will go away. Get medical help right away. Call your local emergency services (911 in the [place] Do not drive yourself to the hospital. Summary Take or apply over-the-counter and prescription medicines only as told by your doctor. Stay away from things you are allergic to. If you are at risk for House very bad allergy reaction, carry an auto-injector pen all the time. Wear House medical alert bracelet or necklace. Very bad allergy reactions can be life-threatening. Get help right away. This information is not intended to replace advice given to you by your health care provider. Make sure you discuss any questions you have with your health care provider. Document Revised:  09/26/2019 Document Reviewed: 09/26/2019 Elsevier Patient Education  2023 Elsevier Inc.    Edwina Barth, MD [facility]

## 2022-08-05 NOTE — Telephone Encounter (Signed)
NOTE NOT NEEDED ?

## 2022-08-19 DIAGNOSIS — I951 Orthostatic hypotension: Secondary | ICD-10-CM | POA: Diagnosis not present

## 2022-08-19 DIAGNOSIS — E785 Hyperlipidemia, unspecified: Secondary | ICD-10-CM | POA: Diagnosis not present

## 2022-08-19 DIAGNOSIS — E039 Hypothyroidism, unspecified: Secondary | ICD-10-CM | POA: Diagnosis not present

## 2022-08-19 DIAGNOSIS — K219 Gastro-esophageal reflux disease without esophagitis: Secondary | ICD-10-CM | POA: Diagnosis not present

## 2022-08-19 DIAGNOSIS — Z809 Family history of malignant neoplasm, unspecified: Secondary | ICD-10-CM | POA: Diagnosis not present

## 2022-08-19 DIAGNOSIS — I10 Essential (primary) hypertension: Secondary | ICD-10-CM | POA: Diagnosis not present

## 2022-08-19 DIAGNOSIS — Z8249 Family history of ischemic heart disease and other diseases of the circulatory system: Secondary | ICD-10-CM | POA: Diagnosis not present

## 2022-08-19 DIAGNOSIS — M1990 Unspecified osteoarthritis, unspecified site: Secondary | ICD-10-CM | POA: Diagnosis not present

## 2022-08-19 DIAGNOSIS — Z8673 Personal history of transient ischemic attack (TIA), and cerebral infarction without residual deficits: Secondary | ICD-10-CM | POA: Diagnosis not present

## 2022-08-19 DIAGNOSIS — R69 Illness, unspecified: Secondary | ICD-10-CM | POA: Diagnosis not present

## 2022-08-19 DIAGNOSIS — Z7982 Long term (current) use of aspirin: Secondary | ICD-10-CM | POA: Diagnosis not present

## 2022-08-19 DIAGNOSIS — Z823 Family history of stroke: Secondary | ICD-10-CM | POA: Diagnosis not present

## 2022-10-18 ENCOUNTER — Ambulatory Visit (INDEPENDENT_AMBULATORY_CARE_PROVIDER_SITE_OTHER): Payer: Medicare HMO

## 2022-10-18 DIAGNOSIS — I639 Cerebral infarction, unspecified: Secondary | ICD-10-CM | POA: Diagnosis not present

## 2022-10-19 LAB — CUP PACEART REMOTE DEVICE CHECK
Date Time Interrogation Session: 20231119230754
Implantable Pulse Generator Implant Date: 20220509

## 2022-11-23 ENCOUNTER — Ambulatory Visit (INDEPENDENT_AMBULATORY_CARE_PROVIDER_SITE_OTHER): Payer: Medicare HMO

## 2022-11-23 DIAGNOSIS — I639 Cerebral infarction, unspecified: Secondary | ICD-10-CM | POA: Diagnosis not present

## 2022-11-23 LAB — CUP PACEART REMOTE DEVICE CHECK
Date Time Interrogation Session: 20231222230635
Implantable Pulse Generator Implant Date: 20220509

## 2022-11-30 NOTE — Progress Notes (Signed)
Carelink Summary Report / Loop Recorder 

## 2022-12-16 NOTE — Progress Notes (Signed)
[facility] Summary Report / Loop Recorder

## 2022-12-23 LAB — CUP PACEART REMOTE DEVICE CHECK
Date Time Interrogation Session: 20240124230821
Implantable Pulse Generator Implant Date: 20220509

## 2022-12-27 ENCOUNTER — Ambulatory Visit: Payer: Medicare HMO | Attending: Cardiology

## 2022-12-27 DIAGNOSIS — I639 Cerebral infarction, unspecified: Secondary | ICD-10-CM

## 2023-01-03 ENCOUNTER — Encounter: Payer: Self-pay | Admitting: Internal Medicine

## 2023-01-03 ENCOUNTER — Ambulatory Visit (INDEPENDENT_AMBULATORY_CARE_PROVIDER_SITE_OTHER): Payer: Medicare HMO | Admitting: Internal Medicine

## 2023-01-03 VITALS — BP 120/80 | HR 92 | Temp 97.8°F | Ht 59.0 in | Wt 130.0 lb

## 2023-01-03 DIAGNOSIS — M2041 Other hammer toe(s) (acquired), right foot: Secondary | ICD-10-CM | POA: Diagnosis not present

## 2023-01-03 DIAGNOSIS — M1711 Unilateral primary osteoarthritis, right knee: Secondary | ICD-10-CM

## 2023-01-03 DIAGNOSIS — E782 Mixed hyperlipidemia: Secondary | ICD-10-CM | POA: Diagnosis not present

## 2023-01-03 DIAGNOSIS — E039 Hypothyroidism, unspecified: Secondary | ICD-10-CM

## 2023-01-03 DIAGNOSIS — Z8673 Personal history of transient ischemic attack (TIA), and cerebral infarction without residual deficits: Secondary | ICD-10-CM

## 2023-01-03 DIAGNOSIS — M21619 Bunion of unspecified foot: Secondary | ICD-10-CM | POA: Diagnosis not present

## 2023-01-03 LAB — LIPID PANEL
Cholesterol: 200 mg/dL (ref 0–200)
HDL: 48.5 mg/dL (ref >39.00)
NonHDL: 151.84
Total CHOL/HDL Ratio: 4
Triglycerides: 228 mg/dL — ABNORMAL HIGH (ref 0.0–149.0)
VLDL: 45.6 mg/dL — ABNORMAL HIGH (ref 0.0–40.0)

## 2023-01-03 LAB — CBC
HCT: 34.5 % — ABNORMAL LOW (ref 36.0–46.0)
Hemoglobin: 11.1 g/dL — ABNORMAL LOW (ref 12.0–15.0)
MCHC: 32.2 g/dL (ref 30.0–36.0)
MCV: 76 fl — ABNORMAL LOW (ref 78.0–100.0)
Platelets: 409 10*3/uL — ABNORMAL HIGH (ref 150.0–400.0)
RBC: 4.54 Mil/uL (ref 3.87–5.11)
RDW: 16.5 % — ABNORMAL HIGH (ref 11.5–15.5)
WBC: 12.6 10*3/uL — ABNORMAL HIGH (ref 4.0–10.5)

## 2023-01-03 LAB — COMPREHENSIVE METABOLIC PANEL
ALT: 14 U/L (ref 0–35)
AST: 18 U/L (ref 0–37)
Albumin: 4.7 g/dL (ref 3.5–5.2)
Alkaline Phosphatase: 71 U/L (ref 39–117)
BUN: 16 mg/dL (ref 6–23)
CO2: 25 mEq/L (ref 19–32)
Calcium: 9.8 mg/dL (ref 8.4–10.5)
Chloride: 105 mEq/L (ref 96–112)
Creatinine, Ser: 1.16 mg/dL (ref 0.40–1.20)
GFR: 45.36 mL/min — ABNORMAL LOW (ref >60.00)
Glucose, Bld: 100 mg/dL — ABNORMAL HIGH (ref 70–99)
Potassium: 3.9 mEq/L (ref 3.5–5.1)
Sodium: 140 mEq/L (ref 135–145)
Total Bilirubin: 0.5 mg/dL (ref 0.2–1.2)
Total Protein: 7.9 g/dL (ref 6.0–8.3)

## 2023-01-03 LAB — HEMOGLOBIN A1C: Hgb A1c MFr Bld: 6.1 % (ref 4.6–6.5)

## 2023-01-03 LAB — TSH: TSH: 4.7 u[IU]/mL (ref 0.35–5.50)

## 2023-01-03 LAB — T4, FREE: Free T4: 0.84 ng/dL (ref 0.60–1.60)

## 2023-01-03 NOTE — Patient Instructions (Signed)
We can get you in with the podiatrist to fix the feet.

## 2023-01-03 NOTE — Progress Notes (Unsigned)
   Subjective:   Patient ID: Tiffany House, female    DOB: [birth year], 77 y.o.   MRN: [id number]  HPI The patient is a 77 YO female coming in for follow up and foot/knee pain.  Review of Systems  Constitutional: Negative.   HENT: Negative.    Eyes: Negative.   Respiratory:  Negative for cough, chest tightness and shortness of breath.   Cardiovascular:  Negative for chest pain, palpitations and leg swelling.  Gastrointestinal:  Negative for abdominal distention, abdominal pain, constipation, diarrhea, nausea and vomiting.  Musculoskeletal:  Positive for arthralgias and myalgias.  Skin: Negative.   Neurological: Negative.   Psychiatric/Behavioral: Negative.      Objective:  Physical Exam Constitutional:      Appearance: She is well-developed.  HENT:     Head: Normocephalic and atraumatic.  Cardiovascular:     Rate and Rhythm: Normal rate and regular rhythm.  Pulmonary:     Effort: Pulmonary effort is normal. No respiratory distress.     Breath sounds: Normal breath sounds. No wheezing or rales.  Abdominal:     General: Bowel sounds are normal. There is no distension.     Palpations: Abdomen is soft.     Tenderness: There is no abdominal tenderness. There is no rebound.  Musculoskeletal:        General: Tenderness present.     Cervical back: Normal range of motion.  Skin:    General: Skin is warm and dry.  Neurological:     Mental Status: She is alert and oriented to person, place, and time.     Coordination: Coordination normal.     Vitals:   01/03/23 1543  BP: 120/80  Pulse: 92  Temp: 97.8 F (36.6 C)  TempSrc: Oral  SpO2: 98%  Weight: 130 lb (59 kg)  Height: 4\' 11"  (1.499 m)    Assessment & Plan:

## 2023-01-04 LAB — LDL CHOLESTEROL, DIRECT: Direct LDL: 113 mg/dL

## 2023-01-06 ENCOUNTER — Encounter: Payer: Self-pay | Admitting: Internal Medicine

## 2023-01-06 DIAGNOSIS — M2041 Other hammer toe(s) (acquired), right foot: Secondary | ICD-10-CM | POA: Insufficient documentation

## 2023-01-06 DIAGNOSIS — M21619 Bunion of unspecified foot: Secondary | ICD-10-CM | POA: Insufficient documentation

## 2023-01-06 NOTE — Assessment & Plan Note (Signed)
Checking lipid panel and adjust as needed simvastatin 40 mg daily. LDL goal <70.

## 2023-01-06 NOTE — Assessment & Plan Note (Signed)
Will see orthopedics back as she may be ready to pursue surgery as this is impacting her walking and pain.

## 2023-01-06 NOTE — Assessment & Plan Note (Signed)
Referral to podiatry for consideration of surgery.

## 2023-01-06 NOTE — Assessment & Plan Note (Signed)
Checking TSH and free T4 with new hair thinning. Adjust synthroid 25 mcg daily as needed.

## 2023-01-06 NOTE — Assessment & Plan Note (Signed)
Checking lipid panel and HgA1c. BP at goal. No new symptoms. Persistent speech hesitation and slight decrease in fluency at times. Taking aspirin and statin. Will continue adjust dose as needed based on labs.

## 2023-01-06 NOTE — Assessment & Plan Note (Signed)
Referral to podiatry for consideration of surgery. Getting pain with walking. No ulcers on exam.

## 2023-01-11 ENCOUNTER — Ambulatory Visit (INDEPENDENT_AMBULATORY_CARE_PROVIDER_SITE_OTHER): Payer: Medicare HMO

## 2023-01-11 ENCOUNTER — Ambulatory Visit: Payer: Medicare HMO

## 2023-01-11 ENCOUNTER — Other Ambulatory Visit: Payer: Self-pay | Admitting: Podiatry

## 2023-01-11 ENCOUNTER — Ambulatory Visit: Payer: Medicare HMO | Admitting: Podiatry

## 2023-01-11 ENCOUNTER — Encounter: Payer: Self-pay | Admitting: Podiatry

## 2023-01-11 ENCOUNTER — Other Ambulatory Visit: Payer: Self-pay | Admitting: Internal Medicine

## 2023-01-11 DIAGNOSIS — M2011 Hallux valgus (acquired), right foot: Secondary | ICD-10-CM

## 2023-01-11 DIAGNOSIS — M2041 Other hammer toe(s) (acquired), right foot: Secondary | ICD-10-CM

## 2023-01-11 NOTE — Progress Notes (Signed)
Subjective:  Patient ID: Tiffany House, female    DOB: May 12, 1946,  MRN: [id number] HPI Chief Complaint  Patient presents with   Toe Pain    2nd toe right - toe crossing over big toe x  several months, noticing it more and more lately, can't wear certain shoes (dress) with comfort   New Patient (Initial Visit)    77 y.o. female presents with the above complaint.   ROS: Denies fever chills nausea vomiting muscle aches pains calf pain back pain chest pain shortness of breath.  Past Medical History:  Diagnosis Date   Allergic rhinitis, cause unspecified    Arthritis    Benign positional vertigo    GERD    HYPERLIPIDEMIA    Hypertension    HYPOTHYROIDISM dx 10/2010   Low back pain    MRI 02/2011   Migraines    OSTEOPENIA    Osteoporosis    Qualifier: Diagnosis of  By: Wynona Luna    RENAL CALCULUS, RIGHT    L 01/2012   Past Surgical History:  Procedure Laterality Date   HAND SURGERY  2001   Dr Daylene Katayama-- surgery of right hand to rebuild joint   HERNIA REPAIR  03/2016   Kidney stone removed  01/12/10   TUBAL LIGATION  1979    Current Outpatient Medications:    acetaminophen (TYLENOL) 325 MG tablet, Take 650 mg by mouth See admin instructions. Take 650 mg by mouth at bedtime and an additional 650 mg every 6 hours as needed for pain, Disp: , Rfl:    aspirin 81 MG chewable tablet, Chew 1 tablet (81 mg total) by mouth daily., Disp: 90 tablet, Rfl: 1   calcium carbonate (OS-CAL - DOSED IN MG OF ELEMENTAL CALCIUM) 1250 (500 Ca) MG tablet, Take 1 tablet by mouth 2 (two) times daily with a meal., Disp: , Rfl:    Cholecalciferol (VITAMIN D-3) 1000 UNITS CAPS, Take 1,000 Units by mouth in the morning and at bedtime., Disp: , Rfl:    diclofenac sodium (VOLTAREN) 1 % GEL, Apply 2 g topically 4 (four) times daily. (Patient taking differently: Apply 2 g topically 4 (four) times daily as needed (for back or knee pain).), Disp: 100 g, Rfl: 3   fish oil-omega-3 fatty acids 1000 MG capsule,  Take 1 g by mouth every evening., Disp: , Rfl:    levothyroxine (SYNTHROID) 25 MCG tablet, Take 1 tablet (25 mcg total) by mouth daily., Disp: 90 tablet, Rfl: 3   Magnesium Hydroxide (MAGNESIA PO), Take by mouth daily., Disp: , Rfl:    Multiple Vitamin (MULTIVITAMIN) tablet, Take 1 tablet by mouth daily., Disp: , Rfl:    multivitamin-lutein (OCUVITE-LUTEIN) CAPS capsule, Take 1 capsule by mouth daily., Disp: , Rfl:    omeprazole (PRILOSEC) 40 MG capsule, TAKE ONE CAPSULE BY MOUTH DAILY, Disp: 90 capsule, Rfl: 2   PARoxetine (PAXIL) 10 MG tablet, Take 1 tablet (10 mg total) by mouth daily., Disp: 90 tablet, Rfl: 3   simvastatin (ZOCOR) 40 MG tablet, Take 1 tablet (40 mg total) by mouth daily at 6 PM., Disp: 90 tablet, Rfl: 3  Allergies  Allergen Reactions   Shellfish Allergy Nausea And Vomiting    Scallops and oysters   Sulfonamide Derivatives Other (See Comments)    Reaction not recalled   Review of Systems Objective:  There were no vitals filed for this visit.  General: Well developed, nourished, in no acute distress, alert and oriented x3   Dermatological: Skin is warm,  dry and supple bilateral. Nails x 10 are well maintained; remaining integument appears unremarkable at this time. There are no open sores, no preulcerative lesions, no rash or signs of infection present.  Vascular: Dorsalis Pedis artery and Posterior Tibial artery pedal pulses are 2/4 bilateral with immedate capillary fill time. Pedal hair growth present. No varicosities and no lower extremity edema present bilateral.   Neruologic: Grossly intact via light touch bilateral. Vibratory intact via tuning fork bilateral. Protective threshold with Semmes Wienstein monofilament intact to all pedal sites bilateral. Patellar and Achilles deep tendon reflexes 2+ bilateral. No Babinski or clonus noted bilateral.   Musculoskeletal: No gross boney pedal deformities bilateral. No pain, crepitus, or limitation noted with foot and ankle  range of motion bilateral. Muscular strength 5/5 in all groups tested bilateral.  Hammertoe deformity 2 through 5 bilaterally right greater than left with medial deviation of all of the toes cocked up hammertoe deformity second right somewhat overlapping the hallux with hallux interphalangeal of the right foot.  Mild hallux valgus deformity of the left foot.  Mild hammertoe deformities left foot.  Gait: Unassisted, Nonantalgic.    Radiographs:  Radiographs taken today demonstrate osseously mature individual with mild demineralization of the bones.  Hallux interphalangeal missed substantial right foot.  Medial deviation of the lesser digits and hammertoe deformity with cock-up deformity second metatarsophalangeal joint right foot.  Assessment & Plan:   Assessment: Hammertoe deformities #2 bilaterally.  Hallux valgus deformity left.  Medial deviation of the lesser digits 2 through 5 left foot.  Plan: Discussed etiology pathology conservative surgical therapies discussed second metatarsal osteotomy hammertoe repair.  She understands this is amenable to fluid however she does not have a time currently to undergo this.  We also discussed an Aiken osteotomy procedure to be taking pain of her sister.  Alzheimer's disease.  Follow-up with her on an as-needed basis.  I did dispense him with his mobic appointments for him to use these and will follow-up as needed with any questions or concerns.     Arrick Dutton T. [place], [place]

## 2023-01-25 ENCOUNTER — Ambulatory Visit: Payer: Medicare HMO

## 2023-01-25 DIAGNOSIS — I639 Cerebral infarction, unspecified: Secondary | ICD-10-CM | POA: Diagnosis not present

## 2023-01-26 LAB — CUP PACEART REMOTE DEVICE CHECK
Date Time Interrogation Session: 20240226230909
Implantable Pulse Generator Implant Date: 20220509

## 2023-01-31 ENCOUNTER — Ambulatory Visit: Payer: Medicare HMO

## 2023-02-08 DIAGNOSIS — M1712 Unilateral primary osteoarthritis, left knee: Secondary | ICD-10-CM | POA: Diagnosis not present

## 2023-02-08 DIAGNOSIS — M1711 Unilateral primary osteoarthritis, right knee: Secondary | ICD-10-CM | POA: Diagnosis not present

## 2023-02-08 DIAGNOSIS — M170 Bilateral primary osteoarthritis of knee: Secondary | ICD-10-CM | POA: Diagnosis not present

## 2023-02-08 NOTE — Progress Notes (Signed)
Carelink Summary Report / Loop Recorder 

## 2023-02-22 DIAGNOSIS — M1712 Unilateral primary osteoarthritis, left knee: Secondary | ICD-10-CM | POA: Diagnosis not present

## 2023-02-22 DIAGNOSIS — M1711 Unilateral primary osteoarthritis, right knee: Secondary | ICD-10-CM | POA: Diagnosis not present

## 2023-02-24 DIAGNOSIS — M1712 Unilateral primary osteoarthritis, left knee: Secondary | ICD-10-CM | POA: Diagnosis not present

## 2023-02-24 DIAGNOSIS — M1711 Unilateral primary osteoarthritis, right knee: Secondary | ICD-10-CM | POA: Diagnosis not present

## 2023-02-28 ENCOUNTER — Ambulatory Visit (INDEPENDENT_AMBULATORY_CARE_PROVIDER_SITE_OTHER): Payer: Medicare HMO

## 2023-02-28 DIAGNOSIS — I639 Cerebral infarction, unspecified: Secondary | ICD-10-CM | POA: Diagnosis not present

## 2023-02-28 LAB — CUP PACEART REMOTE DEVICE CHECK
Date Time Interrogation Session: 20240330230452
Implantable Pulse Generator Implant Date: 20220509

## 2023-03-01 DIAGNOSIS — M1712 Unilateral primary osteoarthritis, left knee: Secondary | ICD-10-CM | POA: Diagnosis not present

## 2023-03-01 DIAGNOSIS — M1711 Unilateral primary osteoarthritis, right knee: Secondary | ICD-10-CM | POA: Diagnosis not present

## 2023-03-02 NOTE — Progress Notes (Signed)
Carelink Summary Report / Loop Recorder 

## 2023-03-03 DIAGNOSIS — M1712 Unilateral primary osteoarthritis, left knee: Secondary | ICD-10-CM | POA: Diagnosis not present

## 2023-03-03 DIAGNOSIS — M1711 Unilateral primary osteoarthritis, right knee: Secondary | ICD-10-CM | POA: Diagnosis not present

## 2023-03-07 ENCOUNTER — Ambulatory Visit: Payer: Medicare HMO

## 2023-03-08 DIAGNOSIS — M1711 Unilateral primary osteoarthritis, right knee: Secondary | ICD-10-CM | POA: Diagnosis not present

## 2023-03-08 DIAGNOSIS — M1712 Unilateral primary osteoarthritis, left knee: Secondary | ICD-10-CM | POA: Diagnosis not present

## 2023-03-10 DIAGNOSIS — M1712 Unilateral primary osteoarthritis, left knee: Secondary | ICD-10-CM | POA: Diagnosis not present

## 2023-03-10 DIAGNOSIS — M1711 Unilateral primary osteoarthritis, right knee: Secondary | ICD-10-CM | POA: Diagnosis not present

## 2023-03-15 DIAGNOSIS — M1712 Unilateral primary osteoarthritis, left knee: Secondary | ICD-10-CM | POA: Diagnosis not present

## 2023-03-15 DIAGNOSIS — M1711 Unilateral primary osteoarthritis, right knee: Secondary | ICD-10-CM | POA: Diagnosis not present

## 2023-03-17 DIAGNOSIS — M1711 Unilateral primary osteoarthritis, right knee: Secondary | ICD-10-CM | POA: Diagnosis not present

## 2023-03-17 DIAGNOSIS — M1712 Unilateral primary osteoarthritis, left knee: Secondary | ICD-10-CM | POA: Diagnosis not present

## 2023-03-21 ENCOUNTER — Telehealth: Payer: Self-pay

## 2023-03-21 NOTE — Telephone Encounter (Signed)
Contacted Leanna Battles Steinruck to schedule their annual wellness visit. Appointment made for 03/23/23.  Agnes Lawrence, CMA (AAMA)  [facility] [phone]

## 2023-03-22 DIAGNOSIS — M1711 Unilateral primary osteoarthritis, right knee: Secondary | ICD-10-CM | POA: Diagnosis not present

## 2023-03-22 DIAGNOSIS — M1712 Unilateral primary osteoarthritis, left knee: Secondary | ICD-10-CM | POA: Diagnosis not present

## 2023-03-23 ENCOUNTER — Ambulatory Visit (INDEPENDENT_AMBULATORY_CARE_PROVIDER_SITE_OTHER): Payer: Medicare HMO

## 2023-03-23 VITALS — Wt 130.0 lb

## 2023-03-23 DIAGNOSIS — Z0000 Encounter for general adult medical examination without abnormal findings: Secondary | ICD-10-CM

## 2023-03-23 NOTE — Progress Notes (Deleted)
Subjective:   Tiffany House is a 77 y.o. female who presents for Medicare Annual (Subsequent) preventive examination.  Review of Systems    ***       Objective:    There were no vitals filed for this visit. There is no height or weight on file to calculate BMI.     04/01/2021    6:11 PM 03/13/2021    9:22 PM 05/16/2019    2:47 PM 02/28/2018    1:41 PM 11/25/2016    4:04 PM  Advanced Directives  Does Patient Have a Medical Advance Directive? Yes;No Yes Yes Yes Yes  Type of Best boy of [place];Living will Healthcare Power of [place];Living will Healthcare Power of [place];Living will  Does patient want to make changes to medical advance directive? No - Patient declined      Copy of Healthcare Power of Attorney in Chart?   No - copy requested No - copy requested No - copy requested    Current Medications (verified) Outpatient Encounter Medications as of 03/23/2023  Medication Sig   acetaminophen (TYLENOL) 325 MG tablet Take 650 mg by mouth See admin instructions. Take 650 mg by mouth at bedtime and an additional 650 mg every 6 hours as needed for pain   aspirin 81 MG chewable tablet Chew 1 tablet (81 mg total) by mouth daily.   calcium carbonate (OS-CAL - DOSED IN MG OF ELEMENTAL CALCIUM) 1250 (500 Ca) MG tablet Take 1 tablet by mouth 2 (two) times daily with a meal.   Cholecalciferol (VITAMIN D-3) 1000 UNITS CAPS Take 1,000 Units by mouth in the morning and at bedtime.   diclofenac sodium (VOLTAREN) 1 % GEL Apply 2 g topically 4 (four) times daily. (Patient taking differently: Apply 2 g topically 4 (four) times daily as needed (for back or knee pain).)   fish oil-omega-3 fatty acids 1000 MG capsule Take 1 g by mouth every evening.   levothyroxine (SYNTHROID) 25 MCG tablet Take 1 tablet (25 mcg total) by mouth daily.   Magnesium Hydroxide (MAGNESIA PO) Take by mouth daily.   Multiple Vitamin (MULTIVITAMIN) tablet Take 1 tablet by mouth daily.    multivitamin-lutein (OCUVITE-LUTEIN) CAPS capsule Take 1 capsule by mouth daily.   omeprazole (PRILOSEC) 40 MG capsule Take 1 capsule (40 mg total) by mouth daily. Annual appt due in May must see provider for future refills   PARoxetine (PAXIL) 10 MG tablet Take 1 tablet (10 mg total) by mouth daily.   simvastatin (ZOCOR) 40 MG tablet Take 1 tablet (40 mg total) by mouth daily at 6 PM.   No facility-administered encounter medications on file as of 03/23/2023.    Allergies (verified) Shellfish allergy and Sulfonamide derivatives   History: Past Medical History:  Diagnosis Date   Allergic rhinitis, cause unspecified    Arthritis    Benign positional vertigo    GERD    HYPERLIPIDEMIA    Hypertension    HYPOTHYROIDISM dx 10/2010   Low back pain    MRI 02/2011   Migraines    OSTEOPENIA    Osteoporosis    Qualifier: Diagnosis of  By: Nena Jordan    RENAL CALCULUS, RIGHT    L 01/2012   Past Surgical History:  Procedure Laterality Date   HAND SURGERY  2001   Dr Teressa Senter-- surgery of right hand to rebuild joint   HERNIA REPAIR  03/2016   Kidney stone removed  01/12/10   TUBAL LIGATION  1979  Family History  Problem Relation Age of Onset   Hyperlipidemia Mother    Hypertension Mother    Cancer Father        prostate. [name]   Coronary artery disease Father    Hypertension Father    Social History   Socioeconomic History   Marital status: Widowed    Spouse name: widow   Number of children: 0   Years of education: Not on file   Highest education level: Not on file  Occupational History   Occupation: Copywriter, advertising: SELF EMPLOYED  Tobacco Use   Smoking status: Never   Smokeless tobacco: Never  Vaping Use   Vaping Use: Never used  Substance and Sexual Activity   Alcohol use: Yes    Comment: occasional   Drug use: No   Sexual activity: Never  Other Topics Concern   Not on file  Social History Narrative   Not on file   Social Determinants of  Health   Financial Resource Strain: Low Risk  (02/28/2018)   Overall Financial Resource Strain (CARDIA)    Difficulty of Paying Living Expenses: Not hard at all  Food Insecurity: No Food Insecurity (04/01/2021)   Hunger Vital Sign    Worried About Running Out of Food in the Last Year: Never true    Ran Out of Food in the Last Year: Never true  Transportation Needs: No Transportation Needs (04/01/2021)   PRAPARE - Administrator, Civil Service (Medical): No    Lack of Transportation (Non-Medical): No  Physical Activity: Inactive (02/28/2018)   Exercise Vital Sign    Days of Exercise per Week: 0 days    Minutes of Exercise per Session: 0 min  Stress: No Stress Concern Present (04/01/2021)   [employer] of Occupational Health - Occupational Stress Questionnaire    Feeling of Stress : Not at all  Social Connections: Moderately Integrated (02/28/2018)   Social Connection and Isolation Panel [NHANES]    Frequency of Communication with Friends and Family: More than three times a week    Frequency of Social Gatherings with Friends and Family: More than three times a week    Attends Religious Services: More than 4 times per year    Active Member of [employer] or Organizations: Yes    Attends Banker Meetings: More than 4 times per year    Marital Status: Widowed    Tobacco Counseling Counseling given: Not Answered   Clinical Intake:                 Diabetic?***         Activities of Daily Living     No data to display          Patient Care Team: Myrlene Broker, MD as PCP - General (Internal Medicine) Valeria Batman, MD (Inactive) as Consulting Physician (Orthopedic Surgery) Serena Colonel, MD (Otolaryngology) Bjorn Pippin, MD (Urology) Enid Baas, MD (Family Medicine) Santiago Glad, MD (Specialist) Antoine Primas, MD (Inactive) as Resident Regan Lemming, MD as Consulting Physician (Cardiology) Ihor Austin, NP as Nurse  Practitioner (Neurology)  Indicate any recent Medical Services you may have received from other than [facility] providers in the past year (date may be approximate).     Assessment:   This is a routine wellness examination for [place].  Hearing/Vision screen No results found.  Dietary issues and exercise activities discussed:     Goals Addressed   None    Depression Screen  07/29/2022    1:22 PM 03/30/2022    9:27 AM 05/15/2021    3:36 PM 04/01/2021    6:07 PM 01/28/2021    2:00 PM 05/16/2019    2:47 PM 02/28/2018    1:41 PM  PHQ 2/9 Scores  PHQ - 2 Score 0 0 0 0 0 0 2  PHQ- 9 Score  1    0 2    Fall Risk    07/29/2022    1:22 PM 03/30/2022    9:26 AM 07/02/2021    2:42 PM 05/15/2021    3:36 PM 01/28/2021    2:00 PM  Fall Risk   Falls in the past year? 1 0 0 1 1  Number falls in past yr: 0 0  0 0  Injury with Fall? 1 0  0 1  Risk for fall due to : No Fall Risks   No Fall Risks   Follow up Falls evaluation completed   Falls evaluation completed     FALL RISK PREVENTION PERTAINING TO THE HOME:  Any stairs in or around the home? {YES/NO:21197} If so, are there any without handrails? {YES/NO:21197} Home free of loose throw rugs in walkways, pet beds, electrical cords, etc? {YES/NO:21197} Adequate lighting in your home to reduce risk of falls? {YES/NO:21197}  ASSISTIVE DEVICES UTILIZED TO PREVENT FALLS:  Life alert? {YES/NO:21197} Use of a cane, walker or w/c? {YES/NO:21197} Grab bars in the bathroom? {YES/NO:21197} Shower chair or bench in shower? {YES/NO:21197} Elevated toilet seat or a handicapped toilet? {YES/NO:21197}  TIMED UP AND GO:  Was the test performed? {YES/NO:21197}.  Length of time to ambulate 10 feet: *** sec.   {Appearance of ZOXW:9604540}  Cognitive Function:        Immunizations Immunization History  Administered Date(s) Administered   Fluad Quad(high Dose 65+) 08/11/2020   Influenza Split 10/06/2011   Influenza Whole 08/19/2008, 08/28/2009,  08/31/2010   Influenza, High Dose Seasonal PF 08/29/2013, 10/27/2022   Influenza-Unspecified 09/13/2012, 08/22/2014, 09/26/2015, 08/29/2016, 08/26/2017, 08/29/2018   PFIZER(Purple Top)SARS-COV-2 Vaccination 01/12/2020, 02/03/2020, 10/01/2020   Pfizer Covid-19 Vaccine Bivalent Booster 71yrs & up 10/27/2022   Pneumococcal Conjugate-13 06/05/2015   Pneumococcal Polysaccharide-23 01/02/2014   RSV,unspecified 09/29/2022   Td 10/19/2000, 11/02/2010   Tdap 01/28/2021   Zoster Recombinat (Shingrix) 04/27/2021, 07/26/2022   Zoster, Live 05/17/2015    {TDAP status:2101805}  {Flu Vaccine status:2101806}  {Pneumococcal vaccine status:2101807}  {Covid-19 vaccine status:2101808}  Qualifies for Shingles Vaccine? {YES/NO:21197}  Zostavax completed {YES/NO:21197}  {Shingrix Completed?:2101804}  Screening Tests Health Maintenance  Topic Date Due   Hepatitis C Screening  Never done   COVID-19 Vaccine (5 - 2023-24 season) 12/22/2022   Medicare Annual Wellness (AWV)  03/31/2023   INFLUENZA VACCINE  06/30/2023   DTaP/Tdap/Td (4 - Td or Tdap) 01/29/2031   Pneumonia Vaccine 74+ Years old  Completed   DEXA SCAN  Completed   Zoster Vaccines- Shingrix  Completed   HPV VACCINES  Aged Out   Fecal DNA (Cologuard)  Discontinued    Health Maintenance  Health Maintenance Due  Topic Date Due   Hepatitis C Screening  Never done   COVID-19 Vaccine (5 - 2023-24 season) 12/22/2022   Medicare Annual Wellness (AWV)  03/31/2023    {Colorectal cancer screening:2101809}  {Mammogram status:21018020}  {Bone Density status:21018021}  Lung Cancer Screening: (Low Dose CT Chest recommended if Age 45-80 years, 30 pack-year currently smoking OR have quit w/in 15years.) {DOES NOT does:27190::"does not"} qualify.   Lung Cancer Screening Referral: ***  Additional Screening:  Hepatitis C Screening: {DOES NOT does:27190::"does not"} qualify; Completed ***  Vision Screening: Recommended annual ophthalmology  exams for early detection of glaucoma and other disorders of the eye. Is the patient up to date with their annual eye exam?  {YES/NO:21197} Who is the provider or what is the name of the office in which the patient attends annual eye exams? *** If pt is not established with a provider, would they like to be referred to a provider to establish care? {YES/NO:21197}.   Dental Screening: Recommended annual dental exams for proper oral hygiene  Community Resource Referral / Chronic Care Management: CRR required this visit?  {YES/NO:21197}  CCM required this visit?  {YES/NO:21197}     Plan:     I have personally reviewed and noted the following in the patient's chart:   Medical and social history Use of alcohol, tobacco or illicit drugs  Current medications and supplements including opioid prescriptions. {Opioid Prescriptions:[phone]} Functional ability and status Nutritional status Physical activity Advanced directives List of other physicians Hospitalizations, surgeries, and ER visits in previous 12 months Vitals Screenings to include cognitive, depression, and falls Referrals and appointments  In addition, I have reviewed and discussed with patient certain preventive protocols, quality metrics, and best practice recommendations. A written personalized care plan for preventive services as well as general preventive health recommendations were provided to patient.     Annabell Sabal, CMA   03/23/2023   Nurse Notes: ***

## 2023-03-23 NOTE — Patient Instructions (Signed)
[name] , Thank you for taking time to come for your Medicare Wellness Visit. I appreciate your ongoing commitment to your health goals. Please review the following plan we discussed and let me know if I can assist you in the future.   These are the goals we discussed:  Goals       <enter goal here> (pt-stated)      To maintain current health.       Patient Stated      Lose weight by using portion control, monitor diet to include a lot of fruits and vegetables. Start to drink more water.        This is a list of the screening recommended for you and due dates:  Health Maintenance  Topic Date Due   Hepatitis C Screening: USPSTF Recommendation to screen - Ages 64-79 yo.  Never done   COVID-19 Vaccine (5 - 2023-24 season) 12/22/2022   Flu Shot  06/30/2023   Medicare Annual Wellness Visit  03/22/2024   DTaP/Tdap/Td vaccine (4 - Td or Tdap) 01/29/2031   Pneumonia Vaccine  Completed   DEXA scan (bone density measurement)  Completed   Zoster (Shingles) Vaccine  Completed   HPV Vaccine  Aged Out   Cologuard (Stool DNA test)  Discontinued    Advanced directives: in chart  Conditions/risks identified: Aim for 30 minutes of exercise or brisk walking, 6-8 glasses of water, and 5 servings of fruits and vegetables each day. Continue knee exercises.  Next appointment: Follow up in one year for your annual wellness visit    Preventive Care 65 Years and Older, Female Preventive care refers to lifestyle choices and visits with your health care provider that can promote health and wellness. What does preventive care include? A yearly physical exam. This is also called an annual well check. Dental exams once or twice a year. Routine eye exams. Ask your health care provider how often you should have your eyes checked. Personal lifestyle choices, including: Daily care of your teeth and gums. Regular physical activity. Eating a healthy diet. Avoiding tobacco and drug use. Limiting alcohol  use. Practicing safe sex. Taking low-dose aspirin every day. Taking vitamin and mineral supplements as recommended by your health care provider. What happens during an annual well check? The services and screenings done by your health care provider during your annual well check will depend on your age, overall health, lifestyle risk factors, and family history of disease. Counseling  Your health care provider may ask you questions about your: Alcohol use. Tobacco use. Drug use. Emotional well-being. Home and relationship well-being. Sexual activity. Eating habits. History of falls. Memory and ability to understand (cognition). Work and work Astronomer. Reproductive health. Screening  You may have the following tests or measurements: Height, weight, and BMI. Blood pressure. Lipid and cholesterol levels. These may be checked every 5 years, or more frequently if you are over 58 years old. Skin check. Lung cancer screening. You may have this screening every year starting at age 56 if you have a 30-pack-year history of smoking and currently smoke or have quit within the past 15 years. Fecal occult blood test (FOBT) of the stool. You may have this test every year starting at age 3. Flexible sigmoidoscopy or colonoscopy. You may have a sigmoidoscopy every 5 years or a colonoscopy every 10 years starting at age 71. Hepatitis C blood test. Hepatitis B blood test. Sexually transmitted disease (STD) testing. Diabetes screening. This is done by checking your blood sugar (glucose)  after you have not eaten for a while (fasting). You may have this done every 1-3 years. Bone density scan. This is done to screen for osteoporosis. You may have this done starting at age 52. Mammogram. This may be done every 1-2 years. Talk to your health care provider about how often you should have regular mammograms. Talk with your health care provider about your test results, treatment options, and if necessary,  the need for more tests. Vaccines  Your health care provider may recommend certain vaccines, such as: Influenza vaccine. This is recommended every year. Tetanus, diphtheria, and acellular pertussis (Tdap, Td) vaccine. You may need a Td booster every 10 years. Zoster vaccine. You may need this after age 31. Pneumococcal 13-valent conjugate (PCV13) vaccine. One dose is recommended after age 19. Pneumococcal polysaccharide (PPSV23) vaccine. One dose is recommended after age 56. Talk to your health care provider about which screenings and vaccines you need and how often you need them. This information is not intended to replace advice given to you by your health care provider. Make sure you discuss any questions you have with your health care provider. Document Released: 12/12/2015 Document Revised: 08/04/2016 Document Reviewed: 09/16/2015 Elsevier Interactive Patient Education  2017 [employer] Prevention in the Home Falls can cause injuries. They can happen to people of all ages. There are many things you can do to make your home safe and to help prevent falls. What can I do on the outside of my home? Regularly fix the edges of walkways and driveways and fix any cracks. Remove anything that might make you trip as you walk through a door, such as a raised step or threshold. Trim any bushes or trees on the path to your home. Use bright outdoor lighting. Clear any walking paths of anything that might make someone trip, such as rocks or tools. Regularly check to see if handrails are loose or broken. Make sure that both sides of any steps have handrails. Any raised decks and porches should have guardrails on the edges. Have any leaves, snow, or ice cleared regularly. Use sand or salt on walking paths during winter. Clean up any spills in your garage right away. This includes oil or grease spills. What can I do in the bathroom? Use night lights. Install grab bars by the toilet and in the  tub and shower. Do not use towel bars as grab bars. Use non-skid mats or decals in the tub or shower. If you need to sit down in the shower, use a plastic, non-slip stool. Keep the floor dry. Clean up any water that spills on the floor as soon as it happens. Remove soap buildup in the tub or shower regularly. Attach bath mats securely with double-sided non-slip rug tape. Do not have throw rugs and other things on the floor that can make you trip. What can I do in the bedroom? Use night lights. Make sure that you have a light by your bed that is easy to reach. Do not use any sheets or blankets that are too big for your bed. They should not hang down onto the floor. Have a firm chair that has side arms. You can use this for support while you get dressed. Do not have throw rugs and other things on the floor that can make you trip. What can I do in the kitchen? Clean up any spills right away. Avoid walking on wet floors. Keep items that you use a lot in easy-to-reach places. If  you need to reach something above you, use a strong step stool that has a grab bar. Keep electrical cords out of the way. Do not use floor polish or wax that makes floors slippery. If you must use wax, use non-skid floor wax. Do not have throw rugs and other things on the floor that can make you trip. What can I do with my stairs? Do not leave any items on the stairs. Make sure that there are handrails on both sides of the stairs and use them. Fix handrails that are broken or loose. Make sure that handrails are as long as the stairways. Check any carpeting to make sure that it is firmly attached to the stairs. Fix any carpet that is loose or worn. Avoid having throw rugs at the top or bottom of the stairs. If you do have throw rugs, attach them to the floor with carpet tape. Make sure that you have a light switch at the top of the stairs and the bottom of the stairs. If you do not have them, ask someone to add them for  you. What else can I do to help prevent falls? Wear shoes that: Do not have high heels. Have rubber bottoms. Are comfortable and fit you well. Are closed at the toe. Do not wear sandals. If you use a stepladder: Make sure that it is fully opened. Do not climb a closed stepladder. Make sure that both sides of the stepladder are locked into place. Ask someone to hold it for you, if possible. Clearly mark and make sure that you can see: Any grab bars or handrails. First and last steps. Where the edge of each step is. Use tools that help you move around (mobility aids) if they are needed. These include: Canes. Walkers. Scooters. Crutches. Turn on the lights when you go into a dark area. Replace any light bulbs as soon as they burn out. Set up your furniture so you have a clear path. Avoid moving your furniture around. If any of your floors are uneven, fix them. If there are any pets around you, be aware of where they are. Review your medicines with your doctor. Some medicines can make you feel dizzy. This can increase your chance of falling. Ask your doctor what other things that you can do to help prevent falls. This information is not intended to replace advice given to you by your health care provider. Make sure you discuss any questions you have with your health care provider. Document Released: 09/11/2009 Document Revised: 04/22/2016 Document Reviewed: 12/20/2014 Elsevier Interactive Patient Education  2017 [employer].

## 2023-03-23 NOTE — Progress Notes (Signed)
Subjective:   Tiffany House is a 77 y.o. female who presents for Medicare Annual (Subsequent) preventive examination.  I connected with  Tiffany House on 03/23/23 by a audio enabled telemedicine application and verified that I am speaking with the correct person using two identifiers.  Patient Location: Home  Provider Location: Home Office  I discussed the limitations of evaluation and management by telemedicine. The patient expressed understanding and agreed to proceed.   Review of Systems     Cardiac Risk Factors include: advanced age (>45men, >46 women);dyslipidemia;Other (see comment), Risk factor comments: hx of stroke     Objective:    Today's Vitals   03/23/23 1102  Weight: 130 lb (59 kg)  PainSc: 1    Body mass index is 26.26 kg/m.     03/23/2023   11:15 AM 04/01/2021    6:11 PM 03/13/2021    9:22 PM 05/16/2019    2:47 PM 02/28/2018    1:41 PM 11/25/2016    4:04 PM  Advanced Directives  Does Patient Have a Medical Advance Directive? Yes Yes;No Yes Yes Yes Yes  Type of Estate agent of [place];Living will   Healthcare Power of [place];Living will Healthcare Power of [place];Living will Healthcare Power of [place];Living will  Does patient want to make changes to medical advance directive? No - Patient declined No - Patient declined      Copy of Healthcare Power of Attorney in Chart? Yes - validated most recent copy scanned in chart (See row information)   No - copy requested No - copy requested No - copy requested    Current Medications (verified) Outpatient Encounter Medications as of 03/23/2023  Medication Sig   acetaminophen (TYLENOL) 325 MG tablet Take 650 mg by mouth See admin instructions. Take 650 mg by mouth at bedtime and an additional 650 mg every 6 hours as needed for pain   aspirin 81 MG chewable tablet Chew 1 tablet (81 mg total) by mouth daily.   calcium carbonate (OS-CAL - DOSED IN MG OF ELEMENTAL CALCIUM) 1250 (500 Ca) MG  tablet Take 1 tablet by mouth 2 (two) times daily with a meal.   Cholecalciferol (VITAMIN D-3) 1000 UNITS CAPS Take 1,000 Units by mouth in the morning and at bedtime.   cyanocobalamin 2000 MCG tablet Take 2,500 mcg by mouth daily.   diclofenac sodium (VOLTAREN) 1 % GEL Apply 2 g topically 4 (four) times daily. (Patient taking differently: Apply 2 g topically 4 (four) times daily as needed (for back or knee pain).)   fish oil-omega-3 fatty acids 1000 MG capsule Take 1 g by mouth every evening.   levothyroxine (SYNTHROID) 25 MCG tablet Take 1 tablet (25 mcg total) by mouth daily.   Magnesium Hydroxide (MAGNESIA PO) Take by mouth daily.   Misc Natural Products (JOINT HEALTH PO) Take by mouth.   Multiple Vitamin (MULTIVITAMIN) tablet Take 1 tablet by mouth daily.   multivitamin-lutein (OCUVITE-LUTEIN) CAPS capsule Take 1 capsule by mouth daily.   omeprazole (PRILOSEC) 40 MG capsule Take 1 capsule (40 mg total) by mouth daily. Annual appt due in May must see provider for future refills   PARoxetine (PAXIL) 10 MG tablet Take 1 tablet (10 mg total) by mouth daily.   simvastatin (ZOCOR) 40 MG tablet Take 1 tablet (40 mg total) by mouth daily at 6 PM.   No facility-administered encounter medications on file as of 03/23/2023.    Allergies (verified) Shellfish allergy and Sulfonamide derivatives   History: Past  Medical History:  Diagnosis Date   Allergic rhinitis, cause unspecified    Arthritis    Benign positional vertigo    GERD    HYPERLIPIDEMIA    Hypertension    HYPOTHYROIDISM dx 10/2010   Low back pain    MRI 02/2011   Migraines    OSTEOPENIA    Osteoporosis    Qualifier: Diagnosis of  By: Nena Jordan    RENAL CALCULUS, RIGHT    L 01/2012   Past Surgical History:  Procedure Laterality Date   HAND SURGERY  2001   Dr Teressa Senter-- surgery of right hand to rebuild joint   HERNIA REPAIR  03/2016   Kidney stone removed  01/12/10   TUBAL LIGATION  1979   Family History  Problem  Relation Age of Onset   Hyperlipidemia Mother    Hypertension Mother    Cancer Father        prostate. [name]   Coronary artery disease Father    Hypertension Father    Social History   Socioeconomic History   Marital status: Widowed    Spouse name: widow   Number of children: 0   Years of education: Not on file   Highest education level: Not on file  Occupational History   Occupation: Copywriter, advertising: SELF EMPLOYED  Tobacco Use   Smoking status: Never   Smokeless tobacco: Never  Vaping Use   Vaping Use: Never used  Substance and Sexual Activity   Alcohol use: Yes    Comment: occasional   Drug use: No   Sexual activity: Never  Other Topics Concern   Not on file  Social History Narrative   Lives alone in town home - handicap accessible restroom   Social Determinants of Health   Financial Resource Strain: Low Risk  (03/23/2023)   Overall Financial Resource Strain (CARDIA)    Difficulty of Paying Living Expenses: Not hard at all  Food Insecurity: No Food Insecurity (03/23/2023)   Hunger Vital Sign    Worried About Running Out of Food in the Last Year: Never true    Ran Out of Food in the Last Year: Never true  Transportation Needs: No Transportation Needs (03/23/2023)   PRAPARE - Administrator, Civil Service (Medical): No    Lack of Transportation (Non-Medical): No  Physical Activity: Sufficiently Active (03/23/2023)   Exercise Vital Sign    Days of Exercise per Week: 3 days    Minutes of Exercise per Session: 60 min  Stress: No Stress Concern Present (03/23/2023)   [employer] of Occupational Health - Occupational Stress Questionnaire    Feeling of Stress : Not at all  Social Connections: Moderately Integrated (03/23/2023)   Social Connection and Isolation Panel [NHANES]    Frequency of Communication with Friends and Family: More than three times a week    Frequency of Social Gatherings with Friends and Family: More than three times a  week    Attends Religious Services: More than 4 times per year    Active Member of [employer] or Organizations: Yes    Attends Banker Meetings: More than 4 times per year    Marital Status: Widowed    Tobacco Counseling Counseling given: Not Answered   Clinical Intake:  Pre-visit preparation completed: Yes  Pain : 0-10 Pain Score: 1  Pain Type: Chronic pain Pain Location: Knee Pain Orientation: Right, Left Pain Descriptors / Indicators: Aching Pain Onset: More than a month ago  Pain Frequency: Intermittent     BMI - recorded: 26.26 Nutritional Status: BMI 25 -29 Overweight Nutritional Risks: None Diabetes: No  How often do you need to have someone help you when you read instructions, pamphlets, or other written materials from your doctor or pharmacy?: 1 - Never  Diabetic? no  Interpreter Needed?: No  Information entered by :: Lasasha Brophy, LPN   Activities of Daily Living    03/23/2023   11:13 AM  In your present state of health, do you have any difficulty performing the following activities:  Hearing? 1  Comment wears hearing aids  Vision? 0  Difficulty concentrating or making decisions? 0  Walking or climbing stairs? 0  Dressing or bathing? 0  Doing errands, shopping? 0  Preparing Food and eating ? N  Using the Toilet? N  In the past six months, have you accidently leaked urine? N  Do you have problems with loss of bowel control? N  Managing your Medications? N  Managing your Finances? Y  Comment her brother helps her - trouble with #s since stroke  Housekeeping or managing your Housekeeping? N    Patient Care Team: Myrlene Broker, MD as PCP - General (Internal Medicine) Regan Lemming, MD as Consulting Physician (Cardiology) Elinor Parkinson, [place] as Consulting Physician (Podiatry) Jodi Geralds, MD as Consulting Physician (Orthopedic Surgery)  Indicate any recent Medical Services you may have received from other than [facility] providers  in the past year (date may be approximate).     Assessment:   This is a routine wellness examination for [place].  Hearing/Vision screen Hearing Screening - Comments:: Wears hearing aids - See Audiologist at [facility] Screening - Comments:: Wears rx glasses - up to date with routine eye exams -forgot name  Dietary issues and exercise activities discussed: Current Exercise Habits: Structured exercise class (PT for knee pain + does exercises on her own), Type of exercise: stretching;walking, Time (Minutes): 60, Frequency (Times/Week): 3, Weekly Exercise (Minutes/Week): 180, Intensity: Mild, Exercise limited by: orthopedic condition(s)   Goals Addressed               This Visit's Progress     <enter goal here> (pt-stated)   On track     To maintain current health.        Depression Screen    03/23/2023   11:11 AM 07/29/2022    1:22 PM 03/30/2022    9:27 AM 05/15/2021    3:36 PM 04/01/2021    6:07 PM 01/28/2021    2:00 PM 05/16/2019    2:47 PM  PHQ 2/9 Scores  PHQ - 2 Score 0 0 0 0 0 0 0  PHQ- 9 Score   1    0    Fall Risk    03/23/2023   11:04 AM 07/29/2022    1:22 PM 03/30/2022    9:26 AM 07/02/2021    2:42 PM 05/15/2021    3:36 PM  Fall Risk   Falls in the past year? 0 1 0 0 1  Number falls in past yr: 0 0 0  0  Injury with Fall? 0 1 0  0  Risk for fall due to : No Fall Risks No Fall Risks   No Fall Risks  Follow up Falls prevention discussed Falls evaluation completed   Falls evaluation completed    FALL RISK PREVENTION PERTAINING TO THE HOME:  Any stairs in or around the home? Yes  If so, are there any without handrails?  No  Home free of loose throw rugs in walkways, pet beds, electrical cords, etc? Yes  Adequate lighting in your home to reduce risk of falls? Yes   ASSISTIVE DEVICES UTILIZED TO PREVENT FALLS:  Life alert? Yes  Use of a cane, walker or w/c?  Has cane to use prn and rollator if needed - but doesn't need Grab bars in the bathroom? Yes  Shower  chair or bench in shower? Yes  Elevated toilet seat or a handicapped toilet? Yes   TIMED UP AND GO:  Was the test performed? No . Telephone visit  Cognitive Function:        03/23/2023   11:15 AM  6CIT Screen  What Year? 0 points  What month? 0 points  What time? 0 points  Count back from 20 0 points  Months in reverse 0 points  Repeat phrase 2 points  Total Score 2 points    Immunizations Immunization History  Administered Date(s) Administered   Fluad Quad(high Dose 65+) 08/11/2020   Influenza Split 10/06/2011   Influenza Whole 08/19/2008, 08/28/2009, 08/31/2010   Influenza, High Dose Seasonal PF 08/29/2013, 10/27/2022   Influenza-Unspecified 09/13/2012, 08/22/2014, 09/26/2015, 08/29/2016, 08/26/2017, 08/29/2018   PFIZER(Purple Top)SARS-COV-2 Vaccination 01/12/2020, 02/03/2020, 10/01/2020   Pfizer Covid-19 Vaccine Bivalent Booster 62yrs & up 10/27/2022   Pneumococcal Conjugate-13 06/05/2015   Pneumococcal Polysaccharide-23 01/02/2014   RSV,unspecified 09/29/2022   Td 10/19/2000, 11/02/2010   Tdap 01/28/2021   Zoster Recombinat (Shingrix) 04/27/2021, 07/26/2022   Zoster, Live 05/17/2015    TDAP status: Up to date  Flu Vaccine status: Up to date  Pneumococcal vaccine status: Up to date  Covid-19 vaccine status: Completed vaccines  Qualifies for Shingles Vaccine? Yes   Zostavax completed Yes   Shingrix Completed?: Yes  Screening Tests Health Maintenance  Topic Date Due   Hepatitis C Screening  Never done   COVID-19 Vaccine (5 - 2023-24 season) 12/22/2022   INFLUENZA VACCINE  06/30/2023   Medicare Annual Wellness (AWV)  03/22/2024   DTaP/Tdap/Td (4 - Td or Tdap) 01/29/2031   Pneumonia Vaccine 42+ Years old  Completed   DEXA SCAN  Completed   Zoster Vaccines- Shingrix  Completed   HPV VACCINES  Aged Out   Fecal DNA (Cologuard)  Discontinued    Health Maintenance  Health Maintenance Due  Topic Date Due   Hepatitis C Screening  Never done   COVID-19  Vaccine (5 - 2023-24 season) 12/22/2022    Colorectal cancer screening: No longer required.   Mammogram status: No longer required due to age - will discuss with PCP.  Bone Density status: Completed 12/07/2018. Results reflect: Bone density results: OSTEOPOROSIS. Repeat every 2 years. Discuss with PCP if she wants this to continue  Lung Cancer Screening: (Low Dose CT Chest recommended if Age 27-80 years, 20 pack-year currently smoking OR have quit w/in 15years.) does not qualify.    Additional Screening:  Hepatitis C Screening: does qualify; DUE WITH NEXT ROUTINE LABS  Vision Screening: Recommended annual ophthalmology exams for early detection of glaucoma and other disorders of the eye. Is the patient up to date with their annual eye exam?  Yes  Who is the provider or what is the name of the office in which the patient attends annual eye exams? Unknown name If pt is not established with a provider, would they like to be referred to a provider to establish care? No .   Dental Screening: Recommended annual dental exams for proper oral hygiene  Community Resource Referral /  Chronic Care Management: CRR required this visit?  No   CCM required this visit?  No      Plan:     I have personally reviewed and noted the following in the patient's chart:   Medical and social history Use of alcohol, tobacco or illicit drugs  Current medications and supplements including opioid prescriptions. Patient is not currently taking opioid prescriptions. Functional ability and status Nutritional status Physical activity Advanced directives List of other physicians Hospitalizations, surgeries, and ER visits in previous 12 months Vitals Screenings to include cognitive, depression, and falls Referrals and appointments  In addition, I have reviewed and discussed with patient certain preventive protocols, quality metrics, and best practice recommendations. A written personalized care plan for  preventive services as well as general preventive health recommendations were provided to patient.   Due to this being a telephonic visit, the after visit summary with patients personalized plan was offered to patient via mail or my-chart.  Patient would like to access on my-chart.  [place] Constable, LPN   1/61/0960   Nurse Notes: No DEXA or Mammo in years - Not sure if you want her to continue these.

## 2023-03-24 DIAGNOSIS — M1711 Unilateral primary osteoarthritis, right knee: Secondary | ICD-10-CM | POA: Diagnosis not present

## 2023-03-24 DIAGNOSIS — M1712 Unilateral primary osteoarthritis, left knee: Secondary | ICD-10-CM | POA: Diagnosis not present

## 2023-03-29 DIAGNOSIS — M1711 Unilateral primary osteoarthritis, right knee: Secondary | ICD-10-CM | POA: Diagnosis not present

## 2023-03-29 DIAGNOSIS — M1712 Unilateral primary osteoarthritis, left knee: Secondary | ICD-10-CM | POA: Diagnosis not present

## 2023-03-31 DIAGNOSIS — M1712 Unilateral primary osteoarthritis, left knee: Secondary | ICD-10-CM | POA: Diagnosis not present

## 2023-03-31 DIAGNOSIS — M1711 Unilateral primary osteoarthritis, right knee: Secondary | ICD-10-CM | POA: Diagnosis not present

## 2023-04-01 LAB — CUP PACEART REMOTE DEVICE CHECK
Date Time Interrogation Session: 20240502230831
Implantable Pulse Generator Implant Date: 20220509

## 2023-04-04 ENCOUNTER — Ambulatory Visit (INDEPENDENT_AMBULATORY_CARE_PROVIDER_SITE_OTHER): Payer: Medicare HMO

## 2023-04-04 ENCOUNTER — Other Ambulatory Visit: Payer: Self-pay | Admitting: Internal Medicine

## 2023-04-04 DIAGNOSIS — I639 Cerebral infarction, unspecified: Secondary | ICD-10-CM

## 2023-04-05 DIAGNOSIS — M1712 Unilateral primary osteoarthritis, left knee: Secondary | ICD-10-CM | POA: Diagnosis not present

## 2023-04-05 DIAGNOSIS — M1711 Unilateral primary osteoarthritis, right knee: Secondary | ICD-10-CM | POA: Diagnosis not present

## 2023-04-05 NOTE — Progress Notes (Signed)
Carelink Summary Report / Loop Recorder 

## 2023-04-07 DIAGNOSIS — M1711 Unilateral primary osteoarthritis, right knee: Secondary | ICD-10-CM | POA: Diagnosis not present

## 2023-04-07 DIAGNOSIS — M1712 Unilateral primary osteoarthritis, left knee: Secondary | ICD-10-CM | POA: Diagnosis not present

## 2023-04-11 ENCOUNTER — Ambulatory Visit: Payer: Medicare HMO

## 2023-05-02 NOTE — Progress Notes (Signed)
Carelink Summary Report / Loop Recorder 

## 2023-05-09 ENCOUNTER — Ambulatory Visit (INDEPENDENT_AMBULATORY_CARE_PROVIDER_SITE_OTHER): Payer: Medicare HMO

## 2023-05-09 DIAGNOSIS — I639 Cerebral infarction, unspecified: Secondary | ICD-10-CM | POA: Diagnosis not present

## 2023-05-09 LAB — CUP PACEART REMOTE DEVICE CHECK
Date Time Interrogation Session: 20240609230417
Implantable Pulse Generator Implant Date: 20220509

## 2023-05-16 ENCOUNTER — Ambulatory Visit: Payer: Medicare HMO

## 2023-05-26 ENCOUNTER — Telehealth: Payer: Self-pay | Admitting: Internal Medicine

## 2023-05-26 ENCOUNTER — Other Ambulatory Visit: Payer: Self-pay | Admitting: Internal Medicine

## 2023-05-26 MED ORDER — PAROXETINE HCL 10 MG PO TABS: 10.0000 mg | ORAL_TABLET | Freq: Every day | ORAL | 0 refills | Status: DC

## 2023-05-26 MED ORDER — LEVOTHYROXINE SODIUM 25 MCG PO TABS: 25.0000 ug | ORAL_TABLET | Freq: Every day | ORAL | 0 refills | Status: DC

## 2023-05-26 MED ORDER — OMEPRAZOLE 40 MG PO CPDR: 40.0000 mg | DELAYED_RELEASE_CAPSULE | Freq: Every day | ORAL | 0 refills | Status: DC

## 2023-05-26 MED ORDER — SIMVASTATIN 40 MG PO TABS: ORAL_TABLET | ORAL | 0 refills | Status: DC

## 2023-05-26 NOTE — Telephone Encounter (Signed)
Next OV is for soonest availability of 06/27/2023.   Prescription Request  05/26/2023  LOV: 01/03/2023  What is the name of the medication or equipment?  omeprazole (PRILOSEC) 40 MG capsule   PARoxetine (PAXIL) 10 MG tablet  simvastatin (ZOCOR) 40 MG tablet  levothyroxine (SYNTHROID) 25 MCG tablet  Have you contacted your pharmacy to request a refill? Yes   Which pharmacy would you like this sent to?  [id number] [place], [place] - [address] [place] [place] [address] Phone: [phone] Fax: [phone]    Patient notified that their request is being sent to the clinical staff for review and that they should receive a response within 2 business days.   Please advise at Mobile [phone] (mobile)

## 2023-05-26 NOTE — Telephone Encounter (Signed)
Sent 30 day supply to [facility] until appt 06/27/23.Marland KitchenRaechel Chute

## 2023-05-30 NOTE — Progress Notes (Signed)
Carelink Summary Report / Loop Recorder 

## 2023-06-13 ENCOUNTER — Ambulatory Visit: Payer: Medicare HMO

## 2023-06-13 DIAGNOSIS — I639 Cerebral infarction, unspecified: Secondary | ICD-10-CM

## 2023-06-13 LAB — CUP PACEART REMOTE DEVICE CHECK
Date Time Interrogation Session: 20240712230615
Implantable Pulse Generator Implant Date: 20220509

## 2023-06-20 ENCOUNTER — Ambulatory Visit: Payer: Medicare HMO

## 2023-06-24 NOTE — Progress Notes (Signed)
Carelink Summary Report / Loop Recorder 

## 2023-06-27 ENCOUNTER — Encounter: Payer: Self-pay | Admitting: Internal Medicine

## 2023-06-27 ENCOUNTER — Ambulatory Visit: Payer: Medicare HMO | Admitting: Internal Medicine

## 2023-06-27 VITALS — BP 126/80 | HR 88 | Temp 98.4°F | Ht 59.0 in | Wt 132.0 lb

## 2023-06-27 DIAGNOSIS — E782 Mixed hyperlipidemia: Secondary | ICD-10-CM

## 2023-06-27 DIAGNOSIS — Z8673 Personal history of transient ischemic attack (TIA), and cerebral infarction without residual deficits: Secondary | ICD-10-CM

## 2023-06-27 DIAGNOSIS — K219 Gastro-esophageal reflux disease without esophagitis: Secondary | ICD-10-CM

## 2023-06-27 DIAGNOSIS — R7303 Prediabetes: Secondary | ICD-10-CM

## 2023-06-27 DIAGNOSIS — F4321 Adjustment disorder with depressed mood: Secondary | ICD-10-CM

## 2023-06-27 DIAGNOSIS — Z0000 Encounter for general adult medical examination without abnormal findings: Secondary | ICD-10-CM | POA: Diagnosis not present

## 2023-06-27 DIAGNOSIS — E039 Hypothyroidism, unspecified: Secondary | ICD-10-CM

## 2023-06-27 DIAGNOSIS — Z1159 Encounter for screening for other viral diseases: Secondary | ICD-10-CM

## 2023-06-27 LAB — T4, FREE: Free T4: 0.89 ng/dL (ref 0.60–1.60)

## 2023-06-27 LAB — CBC
HCT: 32.4 % — ABNORMAL LOW (ref 36.0–46.0)
Hemoglobin: 10 g/dL — ABNORMAL LOW (ref 12.0–15.0)
MCHC: 30.9 g/dL (ref 30.0–36.0)
MCV: 75.9 fl — ABNORMAL LOW (ref 78.0–100.0)
Platelets: 359 10*3/uL (ref 150.0–400.0)
RBC: 4.27 Mil/uL (ref 3.87–5.11)
RDW: 16.2 % — ABNORMAL HIGH (ref 11.5–15.5)
WBC: 8.4 10*3/uL (ref 4.0–10.5)

## 2023-06-27 LAB — LIPID PANEL
Cholesterol: 185 mg/dL (ref 0–200)
HDL: 50.7 mg/dL (ref >39.00)
LDL Cholesterol: 105 mg/dL — ABNORMAL HIGH (ref 0–99)
NonHDL: 134.65
Total CHOL/HDL Ratio: 4
Triglycerides: 148 mg/dL (ref 0.0–149.0)
VLDL: 29.6 mg/dL (ref 0.0–40.0)

## 2023-06-27 LAB — COMPREHENSIVE METABOLIC PANEL
ALT: 13 U/L (ref 0–35)
AST: 18 U/L (ref 0–37)
Albumin: 4.5 g/dL (ref 3.5–5.2)
Alkaline Phosphatase: 63 U/L (ref 39–117)
BUN: 20 mg/dL (ref 6–23)
CO2: 24 mEq/L (ref 19–32)
Calcium: 9.8 mg/dL (ref 8.4–10.5)
Chloride: 105 mEq/L (ref 96–112)
Creatinine, Ser: 1.1 mg/dL (ref 0.40–1.20)
GFR: 48.18 mL/min — ABNORMAL LOW (ref >60.00)
Glucose, Bld: 112 mg/dL — ABNORMAL HIGH (ref 70–99)
Potassium: 4.5 mEq/L (ref 3.5–5.1)
Sodium: 139 mEq/L (ref 135–145)
Total Bilirubin: 0.5 mg/dL (ref 0.2–1.2)
Total Protein: 7.2 g/dL (ref 6.0–8.3)

## 2023-06-27 LAB — HEMOGLOBIN A1C: Hgb A1c MFr Bld: 5.9 % (ref 4.6–6.5)

## 2023-06-27 LAB — TSH: TSH: 6.15 u[IU]/mL — ABNORMAL HIGH (ref 0.35–5.50)

## 2023-06-27 MED ORDER — LEVOTHYROXINE SODIUM 25 MCG PO TABS: 25.0000 ug | ORAL_TABLET | Freq: Every day | ORAL | 3 refills | Status: DC

## 2023-06-27 MED ORDER — OMEPRAZOLE 40 MG PO CPDR: 40.0000 mg | DELAYED_RELEASE_CAPSULE | Freq: Every day | ORAL | 3 refills | Status: DC

## 2023-06-27 MED ORDER — PAROXETINE HCL 10 MG PO TABS: 10.0000 mg | ORAL_TABLET | Freq: Every day | ORAL | 3 refills | Status: DC

## 2023-06-27 MED ORDER — FAMOTIDINE 20 MG PO TABS
20.0000 mg | ORAL_TABLET | Freq: Every day | ORAL | 3 refills | Status: DC
Start: 2023-06-27 — End: 2023-12-26

## 2023-06-27 MED ORDER — SIMVASTATIN 40 MG PO TABS: ORAL_TABLET | ORAL | 3 refills | Status: DC

## 2023-06-27 NOTE — Patient Instructions (Signed)
We are sending in pepcid (famotidine) to take in the evening to help the heart burn/acid. Use this for several weeks and then you can stop if this is doing better or continue long term if needed.

## 2023-06-27 NOTE — Progress Notes (Signed)
   Subjective:   Patient ID: Tiffany House, female    DOB: 1946-06-08, 77 y.o.   MRN: [id number]  HPI The patient is here for physical.  PMH, [facility], social history reviewed and updated  Review of Systems  Constitutional: Negative.   HENT: Negative.    Eyes: Negative.   Respiratory:  Negative for cough, chest tightness and shortness of breath.   Cardiovascular:  Negative for chest pain, palpitations and leg swelling.  Gastrointestinal:  Negative for abdominal distention, abdominal pain, constipation, diarrhea, nausea and vomiting.  Musculoskeletal: Negative.   Skin: Negative.   Neurological: Negative.   Psychiatric/Behavioral: Negative.      Objective:  Physical Exam Constitutional:      Appearance: She is well-developed.  HENT:     Head: Normocephalic and atraumatic.  Cardiovascular:     Rate and Rhythm: Normal rate and regular rhythm.  Pulmonary:     Effort: Pulmonary effort is normal. No respiratory distress.     Breath sounds: Normal breath sounds. No wheezing or rales.  Abdominal:     General: Bowel sounds are normal. There is no distension.     Palpations: Abdomen is soft.     Tenderness: There is no abdominal tenderness. There is no rebound.  Musculoskeletal:     Cervical back: Normal range of motion.  Skin:    General: Skin is warm and dry.  Neurological:     Mental Status: She is alert and oriented to person, place, and time.     Coordination: Coordination normal.     Vitals:   06/27/23 0803 06/27/23 0807 06/27/23 0844  BP: (!) 160/120 (!) 160/120 126/80  Pulse: 88    Temp: 98.4 F (36.9 C)    TempSrc: Oral    SpO2: 96%    Weight: 132 lb (59.9 kg)    Height: 4\' 11"  (1.499 m)      Assessment & Plan:

## 2023-07-01 ENCOUNTER — Encounter: Payer: Self-pay | Admitting: Internal Medicine

## 2023-07-01 DIAGNOSIS — R7303 Prediabetes: Secondary | ICD-10-CM | POA: Insufficient documentation

## 2023-07-01 NOTE — Assessment & Plan Note (Signed)
Taking omeprazole 40 mg daily and refill as needed.

## 2023-07-01 NOTE — Assessment & Plan Note (Signed)
Checking HgA1c and adjust as needed.  

## 2023-07-01 NOTE — Assessment & Plan Note (Signed)
Checking TSH and adjust synthroid 25 mcg daily as needed.  ?

## 2023-07-01 NOTE — Assessment & Plan Note (Signed)
Taking paxil 10 mg daily and doing okay. Sister in law with alzheimer's and progressing rapidly which is an added stressor.

## 2023-07-01 NOTE — Assessment & Plan Note (Signed)
No new symptoms. Taking aspirin 81 mg daily and simvastatin. Checking lipid panel, CMP, Hga1c and adjust as needed. BP normal at home and on recheck.

## 2023-07-01 NOTE — Assessment & Plan Note (Signed)
Checking lipid panel and adjust as needed her simvastatin 40 mg daily for LDL <100.

## 2023-07-01 NOTE — Assessment & Plan Note (Signed)
Flu shot yearly. Pneumonia complete. Shingrix complete. Tetanus up to date. Colonoscopy aged out prior to recall. Mammogram up to date, pap smear aged out and dexa complete. Counseled about sun safety and mole surveillance. Counseled about the dangers of distracted driving. Given 10 year screening recommendations.

## 2023-07-18 ENCOUNTER — Ambulatory Visit (INDEPENDENT_AMBULATORY_CARE_PROVIDER_SITE_OTHER): Payer: Medicare HMO

## 2023-07-18 DIAGNOSIS — I639 Cerebral infarction, unspecified: Secondary | ICD-10-CM | POA: Diagnosis not present

## 2023-07-18 LAB — CUP PACEART REMOTE DEVICE CHECK
Date Time Interrogation Session: 20240818230752
Implantable Pulse Generator Implant Date: 20220509

## 2023-07-25 ENCOUNTER — Ambulatory Visit: Payer: Medicare HMO

## 2023-07-28 NOTE — Progress Notes (Signed)
Carelink Summary Report / Loop Recorder 

## 2023-08-22 ENCOUNTER — Ambulatory Visit (INDEPENDENT_AMBULATORY_CARE_PROVIDER_SITE_OTHER): Payer: Medicare HMO

## 2023-08-22 DIAGNOSIS — I639 Cerebral infarction, unspecified: Secondary | ICD-10-CM | POA: Diagnosis not present

## 2023-08-22 LAB — CUP PACEART REMOTE DEVICE CHECK
Date Time Interrogation Session: 20240920230159
Implantable Pulse Generator Implant Date: 20220509

## 2023-08-29 ENCOUNTER — Ambulatory Visit: Payer: Medicare HMO

## 2023-09-02 NOTE — Progress Notes (Signed)
Carelink Summary Report / Loop Recorder 

## 2023-09-26 ENCOUNTER — Ambulatory Visit: Payer: Medicare HMO

## 2023-09-26 DIAGNOSIS — I639 Cerebral infarction, unspecified: Secondary | ICD-10-CM | POA: Diagnosis not present

## 2023-09-27 LAB — CUP PACEART REMOTE DEVICE CHECK
Date Time Interrogation Session: 20241027230541
Implantable Pulse Generator Implant Date: 20220509

## 2023-10-03 ENCOUNTER — Ambulatory Visit: Payer: Medicare HMO

## 2023-10-13 NOTE — Progress Notes (Signed)
Carelink Summary Report / Loop Recorder 

## 2023-10-30 LAB — CUP PACEART REMOTE DEVICE CHECK
Date Time Interrogation Session: 20241129230242
Implantable Pulse Generator Implant Date: 20220509

## 2023-10-31 ENCOUNTER — Ambulatory Visit (INDEPENDENT_AMBULATORY_CARE_PROVIDER_SITE_OTHER): Payer: Medicare HMO

## 2023-10-31 DIAGNOSIS — I639 Cerebral infarction, unspecified: Secondary | ICD-10-CM | POA: Diagnosis not present

## 2023-11-07 ENCOUNTER — Ambulatory Visit: Payer: Medicare HMO

## 2023-12-05 ENCOUNTER — Ambulatory Visit (INDEPENDENT_AMBULATORY_CARE_PROVIDER_SITE_OTHER): Payer: Medicare HMO

## 2023-12-05 DIAGNOSIS — I639 Cerebral infarction, unspecified: Secondary | ICD-10-CM | POA: Diagnosis not present

## 2023-12-05 LAB — CUP PACEART REMOTE DEVICE CHECK
Date Time Interrogation Session: 20250105230458
Implantable Pulse Generator Implant Date: 20220509

## 2023-12-08 ENCOUNTER — Ambulatory Visit: Payer: Medicare HMO | Admitting: Podiatry

## 2023-12-09 ENCOUNTER — Ambulatory Visit: Payer: Medicare HMO | Admitting: Podiatry

## 2023-12-12 ENCOUNTER — Ambulatory Visit: Payer: Medicare HMO

## 2023-12-26 ENCOUNTER — Ambulatory Visit (INDEPENDENT_AMBULATORY_CARE_PROVIDER_SITE_OTHER): Payer: Medicare HMO | Admitting: Podiatry

## 2023-12-26 ENCOUNTER — Encounter: Payer: Self-pay | Admitting: Podiatry

## 2023-12-26 ENCOUNTER — Ambulatory Visit (INDEPENDENT_AMBULATORY_CARE_PROVIDER_SITE_OTHER): Payer: Medicare HMO

## 2023-12-26 DIAGNOSIS — M2011 Hallux valgus (acquired), right foot: Secondary | ICD-10-CM | POA: Diagnosis not present

## 2023-12-26 DIAGNOSIS — M2041 Other hammer toe(s) (acquired), right foot: Secondary | ICD-10-CM

## 2023-12-26 NOTE — Progress Notes (Signed)
Subjective:   Patient ID: Tiffany House, female   DOB: 78 y.o.   MRN: [id number]   HPI Patient states these toes on my right foot are really bothering me I saw Dr. Al Corpus last year I was not able to do it but I am able to do it now and saw you numerous times in the past.  Patient states that she would like to get it straightened as shoe gear is increasingly hard.  She has tried wider shoes she has tried cushioning the area and other modalities   ROS      Objective:  Physical Exam  Neurovascular status was found to be intact muscle strength found to be adequate range of motion adequate subtalar midtarsal joint.  Patient has elevation with rigid contracture digits 2-3 of the right foot with dorsal medial dislocation and pain in the metatarsal phalangeal joint of digit 2 right.  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  Patient would like to have this fixed understands surgery and at this point I allowed her to read consent form going over alternative treatments complications.  Patient is scheduled outpatient surgery with all questions answered today and there is understood that there is no guarantee we will be able to get the second toe straight or stay down but I do feel that I will be in a better position certainly that it is now.  Dispensed air fracture walker fitted properly to her lower leg that I want her to get used to wearing at this time and she will wear it prior to procedure to be comfortable with it  X-rays indicate there is significant elevation digits 2 3 and medial dislocation of the digits right foot

## 2023-12-27 ENCOUNTER — Telehealth: Payer: Self-pay | Admitting: Urology

## 2023-12-27 NOTE — Telephone Encounter (Signed)
Called and LM for pt to call back to schedule sx with Dr. Charlsie Merles.

## 2023-12-28 ENCOUNTER — Telehealth: Payer: Self-pay | Admitting: Podiatry

## 2023-12-29 ENCOUNTER — Telehealth: Payer: Self-pay | Admitting: Podiatry

## 2023-12-29 ENCOUNTER — Telehealth: Payer: Self-pay | Admitting: Internal Medicine

## 2023-12-29 NOTE — Telephone Encounter (Unsigned)
Copied from CRM [id number]. Topic: Clinical - Prescription Issue >> Dec 29, 2023  2:05 PM Elizebeth Brooking wrote: Reason for CRM: [facility] called stating the medication levothyroxine (SYNTHROID) 25 MCG tablet that they have there is a different manufacture needs a confirmation on this if you can give them a callback at [phone] and [phone]

## 2023-12-29 NOTE — Telephone Encounter (Signed)
DOS-12/31/23  MET OSTEOTOMY 2ND [id number] HAMMERTOE REPAIR 2,3 [id number]  [employer] EFFECTIVE DATE- 11/29/20  DEDUCTIBLE- $0.00 OOP-$4150.00 WITH $4150.00 LEFT COINSURANCE- 0%  SPOKE WITH GRYFFIN S FROM [employer] AND HE STATED THAT PRIOR AUTH IS NOT REQUIRED FOR CPT CODES 45409 AND [phone].  CALL REF  #: [id number]

## 2024-01-03 ENCOUNTER — Telehealth: Payer: Self-pay

## 2024-01-03 NOTE — Telephone Encounter (Signed)
 Copied from CRM [id number]. Topic: Clinical - Medication Question >> Jan 03, 2024  3:57 PM Melissa C wrote:  Reason for CRM: [facility] calling regarding need to switch manufacturers for patient's levothyroxine  (SYNTHROID ) 25 MCG tablet. They have already spoken with patient and patient is ok with it, they just need doctor's approval. Callback number [phone]

## 2024-01-04 NOTE — Telephone Encounter (Signed)
 Called pharmacy and gave the, verbal ok to switch to manufacture

## 2024-01-04 NOTE — Telephone Encounter (Signed)
 Ok for Arts development officer

## 2024-01-05 NOTE — Telephone Encounter (Signed)
 Called and inform the pharmacy

## 2024-01-09 ENCOUNTER — Ambulatory Visit (INDEPENDENT_AMBULATORY_CARE_PROVIDER_SITE_OTHER): Payer: Medicare HMO

## 2024-01-09 DIAGNOSIS — I639 Cerebral infarction, unspecified: Secondary | ICD-10-CM

## 2024-01-09 LAB — CUP PACEART REMOTE DEVICE CHECK
Date Time Interrogation Session: 20250209231025
Implantable Pulse Generator Implant Date: 20220509

## 2024-01-11 NOTE — Progress Notes (Signed)
[facility] Summary Report / Loop Recorder

## 2024-01-16 MED ORDER — HYDROCODONE-ACETAMINOPHEN 10-325 MG PO TABS: 1.0000 | ORAL_TABLET | Freq: Three times a day (TID) | ORAL | 0 refills | Status: AC | PRN

## 2024-01-16 NOTE — Addendum Note (Signed)
 Addended by: Lenn Sink on: 01/16/2024 05:03 PM   Modules accepted: Orders

## 2024-01-17 DIAGNOSIS — M21541 Acquired clubfoot, right foot: Secondary | ICD-10-CM

## 2024-01-17 DIAGNOSIS — M2011 Hallux valgus (acquired), right foot: Secondary | ICD-10-CM

## 2024-01-17 DIAGNOSIS — M205X1 Other deformities of toe(s) (acquired), right foot: Secondary | ICD-10-CM | POA: Diagnosis not present

## 2024-01-17 DIAGNOSIS — M2041 Other hammer toe(s) (acquired), right foot: Secondary | ICD-10-CM

## 2024-01-23 ENCOUNTER — Ambulatory Visit (INDEPENDENT_AMBULATORY_CARE_PROVIDER_SITE_OTHER): Payer: Medicare HMO

## 2024-01-23 ENCOUNTER — Encounter: Payer: Self-pay | Admitting: Podiatry

## 2024-01-23 ENCOUNTER — Ambulatory Visit (INDEPENDENT_AMBULATORY_CARE_PROVIDER_SITE_OTHER): Payer: Medicare HMO | Admitting: Podiatry

## 2024-01-23 DIAGNOSIS — M2011 Hallux valgus (acquired), right foot: Secondary | ICD-10-CM

## 2024-01-23 DIAGNOSIS — Z9889 Other specified postprocedural states: Secondary | ICD-10-CM

## 2024-01-23 NOTE — Progress Notes (Signed)
 Subjective:   Patient ID: Tiffany House, female   DOB: 78 y.o.   MRN: [id number]   HPI Patient states doing well with surgery very pleased at the current time   ROS      Objective:  Physical Exam  Neurovascular status intact with the patient's right foot healing well wound edges coapted well pins intact second and third toes good alignment slight elevation of the second digit     Assessment:  Overall doing well with significant forefoot pathology preoperatively     Plan:  H&P x-rays reviewed negative Denna Haggard' sign was noted and I reviewed continuing to keep the toes down reapplied sterile dressing and dispensed surgical shoe with patient to still be completely immobilized.  Reappoint to recheck  X-rays indicate osteotomies healing well pins are in good alignment screw in place

## 2024-02-06 ENCOUNTER — Encounter: Payer: Self-pay | Admitting: Podiatry

## 2024-02-06 ENCOUNTER — Ambulatory Visit (INDEPENDENT_AMBULATORY_CARE_PROVIDER_SITE_OTHER): Payer: Medicare HMO | Admitting: Podiatry

## 2024-02-06 ENCOUNTER — Ambulatory Visit (INDEPENDENT_AMBULATORY_CARE_PROVIDER_SITE_OTHER)

## 2024-02-06 DIAGNOSIS — Z9889 Other specified postprocedural states: Secondary | ICD-10-CM | POA: Diagnosis not present

## 2024-02-06 DIAGNOSIS — M79671 Pain in right foot: Secondary | ICD-10-CM | POA: Diagnosis not present

## 2024-02-08 NOTE — Progress Notes (Signed)
 Subjective:   Patient ID: Tiffany House, female   DOB: 78 y.o.   MRN: [id number]   HPI Patient states she is doing well with surgery she is pleased so far with minimal pain   ROS      Objective:  Physical Exam  Neuro vascular status intact negative Denna Haggard' sign noted digits are healing well wound edges well coapted toes in good alignment     Assessment:  Doing well post digital fusion procedures and osteotomy     Plan:  H&P x-ray reviewed stitches were removed no bleeding was noted and sterile dressing applied continue immobilization elevation and reappoint 2 weeks pin removal  X-rays indicate everything is stable so far alignment looks good screw in place

## 2024-02-13 ENCOUNTER — Ambulatory Visit: Payer: Medicare HMO

## 2024-02-13 DIAGNOSIS — I639 Cerebral infarction, unspecified: Secondary | ICD-10-CM | POA: Diagnosis not present

## 2024-02-13 NOTE — Progress Notes (Signed)
 [facility] Summary Report / Loop Recorder

## 2024-02-14 LAB — CUP PACEART REMOTE DEVICE CHECK
Date Time Interrogation Session: 20250316230808
Implantable Pulse Generator Implant Date: 20220509

## 2024-02-20 ENCOUNTER — Encounter: Payer: Self-pay | Admitting: Podiatry

## 2024-02-20 ENCOUNTER — Ambulatory Visit (INDEPENDENT_AMBULATORY_CARE_PROVIDER_SITE_OTHER)

## 2024-02-20 ENCOUNTER — Ambulatory Visit: Admitting: Podiatry

## 2024-02-20 DIAGNOSIS — Z9889 Other specified postprocedural states: Secondary | ICD-10-CM | POA: Diagnosis not present

## 2024-02-21 NOTE — Progress Notes (Signed)
 Subjective:   Patient ID: Tiffany House, female   DOB: 78 y.o.   MRN: [id number]   HPI Patient presents for removal of pins right states very pleased   ROS      Objective:  Physical Exam  Neurovascular status intact negative Denna Haggard' sign noted pins in place right second and third digits good alignment noted     Assessment:  Overall doing well with foot surgery right incision sites healing well     Plan:  H&P x-rays reviewed sterile removal of pins with sterile dressings applied continue with immobilization and gradual return to soft shoe gear in the next 2 weeks  X-rays indicate good alignment noted significant improvement screw in place second metatarsal dispensed ankle compression sock

## 2024-03-08 ENCOUNTER — Ambulatory Visit: Admitting: Podiatry

## 2024-03-09 ENCOUNTER — Ambulatory Visit: Admitting: Podiatry

## 2024-03-09 ENCOUNTER — Ambulatory Visit (INDEPENDENT_AMBULATORY_CARE_PROVIDER_SITE_OTHER)

## 2024-03-09 ENCOUNTER — Encounter: Payer: Self-pay | Admitting: Podiatry

## 2024-03-09 DIAGNOSIS — Z9889 Other specified postprocedural states: Secondary | ICD-10-CM

## 2024-03-09 DIAGNOSIS — M79671 Pain in right foot: Secondary | ICD-10-CM

## 2024-03-12 NOTE — Progress Notes (Signed)
 Subjective:   Patient ID: Tiffany House, female   DOB: 78 y.o.   MRN: [id number]   HPI Patient states overall doing very well with her right foot very pleased with recovery with only mild swelling still noted   ROS      Objective:  Physical Exam  Neuro vascular status intact negative Celine Collard' sign noted right first metatarsal healing well wound edges well coapted range of motion good     Assessment:  Doing well post surgery of the first metatarsal right foot     Plan:  H&P reviewed recommended gradual return to all normal shoe gear reviewed last x-rays patient's discharge will be seen back as needed  X-rays indicate good healing good structural alignment no signs of pathology

## 2024-03-19 ENCOUNTER — Ambulatory Visit: Payer: Medicare HMO

## 2024-03-19 DIAGNOSIS — I639 Cerebral infarction, unspecified: Secondary | ICD-10-CM | POA: Diagnosis not present

## 2024-03-20 LAB — CUP PACEART REMOTE DEVICE CHECK
Date Time Interrogation Session: 20250420230506
Implantable Pulse Generator Implant Date: 20220509

## 2024-03-26 ENCOUNTER — Ambulatory Visit (INDEPENDENT_AMBULATORY_CARE_PROVIDER_SITE_OTHER): Payer: Medicare HMO

## 2024-03-26 VITALS — Ht 59.0 in | Wt 115.0 lb

## 2024-03-26 DIAGNOSIS — Z0000 Encounter for general adult medical examination without abnormal findings: Secondary | ICD-10-CM

## 2024-03-26 NOTE — Progress Notes (Signed)
 Subjective:   Tiffany House is a 78 y.o. who presents for a Medicare Wellness preventive visit.  Visit Complete: In person  Persons Participating in Visit: Patient.  AWV Questionnaire: No: Patient Medicare AWV questionnaire was not completed prior to this visit.  Cardiac Risk Factors include: advanced age (>26men, >64 women)     Objective:    Today's Vitals   03/26/24 0928  Weight: 115 lb (52.2 kg)  Height: 4\' 11"  (1.499 m)   Body mass index is 23.23 kg/m.     03/26/2024    9:26 AM 03/23/2023   11:15 AM 04/01/2021    6:11 PM 03/13/2021    9:22 PM 05/16/2019    2:47 PM 02/28/2018    1:41 PM 11/25/2016    4:04 PM  Advanced Directives  Does Patient Have a Medical Advance Directive? Yes Yes Yes;No Yes Yes Yes Yes  Type of Estate agent of [place];Living will Healthcare Power of [place];Living will   Healthcare Power of [place];Living will Healthcare Power of [place];Living will Healthcare Power of [place];Living will  Does patient want to make changes to medical advance directive? No - Patient declined No - Patient declined No - Patient declined      Copy of Healthcare Power of Attorney in Chart? Yes - validated most recent copy scanned in chart (See row information) Yes - validated most recent copy scanned in chart (See row information)   No - copy requested No - copy requested No - copy requested    Current Medications (verified) Outpatient Encounter Medications as of 03/26/2024  Medication Sig   acetaminophen  (TYLENOL ) 325 MG tablet Take 650 mg by mouth See admin instructions. Take 650 mg by mouth at bedtime and an additional 650 mg every 6 hours as needed for pain   aspirin  81 MG chewable tablet Chew 1 tablet (81 mg total) by mouth daily.   calcium  carbonate (OS-CAL - DOSED IN MG OF ELEMENTAL CALCIUM ) 1250 (500 Ca) MG tablet Take 1 tablet by mouth 2 (two) times daily with a meal.   Cholecalciferol (VITAMIN D -3) 1000 UNITS CAPS Take 1,000 Units by mouth  in the morning and at bedtime.   diclofenac  sodium (VOLTAREN ) 1 % GEL Apply 2 g topically 4 (four) times daily. (Patient taking differently: Apply 2 g topically 4 (four) times daily as needed (for back or knee pain).)   fish oil-omega-3 fatty acids  1000 MG capsule Take 1 g by mouth every evening.   levothyroxine  (SYNTHROID ) 25 MCG tablet Take 1 tablet (25 mcg total) by mouth daily.   Magnesium Hydroxide (MAGNESIA PO) Take by mouth daily.   Misc Natural Products (JOINT HEALTH PO) Take by mouth.   Multiple Vitamin (MULTIVITAMIN) tablet Take 1 tablet by mouth daily.   multivitamin-lutein (OCUVITE-LUTEIN) CAPS capsule Take 1 capsule by mouth daily.   omeprazole  (PRILOSEC) 40 MG capsule Take 1 capsule (40 mg total) by mouth daily.   PARoxetine  (PAXIL ) 10 MG tablet Take 1 tablet (10 mg total) by mouth daily.   simvastatin  (ZOCOR ) 40 MG tablet TAKE ONE TABLET BY MOUTH DAILY AT 6 P.M.   No facility-administered encounter medications on file as of 03/26/2024.    Allergies (verified) Shellfish allergy and Sulfonamide derivatives   History: Past Medical History:  Diagnosis Date   Allergic rhinitis, cause unspecified    Arthritis    Benign positional vertigo    GERD    HYPERLIPIDEMIA    Hypertension    HYPOTHYROIDISM dx 10/2010   Low back pain  MRI 02/2011   Migraines    OSTEOPENIA    Osteoporosis    Qualifier: Diagnosis of  By: Marthe Slain    RENAL CALCULUS, RIGHT    L 01/2012   Past Surgical History:  Procedure Laterality Date   HAND SURGERY  2001   Dr Lorena Rolling-- surgery of right hand to rebuild joint   HERNIA REPAIR  03/2016   Kidney stone removed  01/12/10   TUBAL LIGATION  1979   Family History  Problem Relation Age of Onset   Hyperlipidemia Mother    Hypertension Mother    Cancer Father        prostate. Louvella Royalty   Coronary artery disease Father    Hypertension Father    Social History   Socioeconomic History   Marital status: Widowed    Spouse name: widow    Number of children: 0   Years of education: Not on file   Highest education level: Not on file  Occupational History   Occupation: Copywriter, advertising: SELF EMPLOYED  Tobacco Use   Smoking status: Never    Passive exposure: Never   Smokeless tobacco: Never  Vaping Use   Vaping status: Never Used  Substance and Sexual Activity   Alcohol use: Yes    Alcohol/week: 1.0 standard drink of alcohol    Types: 1 Glasses of wine per week    Comment: occasional   Drug use: No   Sexual activity: Never  Other Topics Concern   Not on file  Social History Narrative   Lives alone in town home - handicap accessible restroom   Social Drivers of Health   Financial Resource Strain: Low Risk  (03/26/2024)   Overall Financial Resource Strain (CARDIA)    Difficulty of Paying Living Expenses: Not hard at all  Food Insecurity: No Food Insecurity (03/26/2024)   Hunger Vital Sign    Worried About Running Out of Food in the Last Year: Never true    Ran Out of Food in the Last Year: Never true  Transportation Needs: No Transportation Needs (03/26/2024)   PRAPARE - Administrator, Civil Service (Medical): No    Lack of Transportation (Non-Medical): No  Physical Activity: Insufficiently Active (03/26/2024)   Exercise Vital Sign    Days of Exercise per Week: 2 days    Minutes of Exercise per Session: 20 min  Stress: No Stress Concern Present (03/26/2024)   [employer] of Occupational Health - Occupational Stress Questionnaire    Feeling of Stress : Not at all  Social Connections: Moderately Integrated (03/26/2024)   Social Connection and Isolation Panel [NHANES]    Frequency of Communication with Friends and Family: More than three times a week    Frequency of Social Gatherings with Friends and Family: More than three times a week    Attends Religious Services: More than 4 times per year    Active Member of [employer] or Organizations: Yes    Attends Banker Meetings: More than 4  times per year    Marital Status: Widowed    Tobacco Counseling Counseling given: No    Clinical Intake:  Pre-visit preparation completed: Yes  Pain : No/denies pain     Nutritional Risks: None Diabetes: No  Lab Results  Component Value Date   HGBA1C 5.9 06/27/2023   HGBA1C 6.1 01/03/2023   HGBA1C 6.0 03/30/2022     How often do you need to have someone help you when you read  instructions, pamphlets, or other written materials from your doctor or pharmacy?: 1 - Never  Interpreter Needed?: No  Information entered by :: Kandy Orris, CMA   Activities of Daily Living     03/26/2024    9:32 AM  In your present state of health, do you have any difficulty performing the following activities:  Hearing? 0  Vision? 0  Difficulty concentrating or making decisions? 0  Walking or climbing stairs? 0  Dressing or bathing? 0  Doing errands, shopping? 0  Preparing Food and eating ? N  Using the Toilet? N  In the past six months, have you accidently leaked urine? N  Do you have problems with loss of bowel control? N  Managing your Medications? N  Managing your Finances? N  Housekeeping or managing your Housekeeping? N    Patient Care Team: Adelia Homestead, MD as PCP - General (Internal Medicine) Lei Pump, MD as Consulting Physician (Cardiology) Clemetine Cypher, [place] as Consulting Physician (Podiatry) Neil Balls, MD as Consulting Physician (Orthopedic Surgery) Sandor Crosser, OD as Referring Physician (Ophthalmology)  Indicate any recent Medical Services you may have received from other than [facility] providers in the past year (date may be approximate).     Assessment:   This is a routine wellness examination for [place].  Hearing/Vision screen Hearing Screening - Comments:: Wears hearing aids - no concerns  Vision Screening - Comments:: Wears rx glasses - up to date with routine eye exams with Sandor Crosser [facility])   Goals Addressed                This Visit's Progress     Patient Stated (pt-stated)        Patient stated she plans to continue staying active and be safe due to having a fall.       Depression Screen     03/26/2024    9:39 AM 06/27/2023    8:07 AM 03/23/2023   11:11 AM 07/29/2022    1:22 PM 03/30/2022    9:27 AM 05/15/2021    3:36 PM 04/01/2021    6:07 PM  PHQ 2/9 Scores  PHQ - 2 Score 0 0 0 0 0 0 0  PHQ- 9 Score 0    1      Fall Risk     03/26/2024    9:33 AM 06/27/2023    8:07 AM 03/23/2023   11:04 AM 07/29/2022    1:22 PM 03/30/2022    9:26 AM  Fall Risk   Falls in the past year? 1 0 0 1 0  Number falls in past yr: 0 0 0 0 0  Comment 1 fall      Injury with Fall? 0 0 0 1 0  Risk for fall due to : History of fall(s)  No Fall Risks No Fall Risks   Follow up Falls evaluation completed;Falls prevention discussed Falls evaluation completed Falls prevention discussed Falls evaluation completed     MEDICARE RISK AT HOME:  Medicare Risk at Home Any stairs in or around the home?: Yes If so, are there any without handrails?: No Home free of loose throw rugs in walkways, pet beds, electrical cords, etc?: Yes Adequate lighting in your home to reduce risk of falls?: Yes Life alert?: No Use of a cane, walker or w/c?: No Grab bars in the bathroom?: Yes Shower chair or bench in shower?: Yes Elevated toilet seat or a handicapped toilet?: Yes  TIMED UP AND GO:  Was  the test performed?  No  Cognitive Function: 6CIT completed        03/26/2024    9:38 AM 03/23/2023   11:15 AM  6CIT Screen  What Year? 0 points 0 points  What month? 0 points 0 points  What time? 0 points 0 points  Count back from 20 0 points 0 points  Months in reverse 0 points 0 points  Repeat phrase 2 points 2 points  Total Score 2 points 2 points    Immunizations Immunization History  Administered Date(s) Administered   Fluad Quad(high Dose 65+) 08/11/2020   Influenza Split 10/06/2011   Influenza Whole 08/19/2008, 08/28/2009,  08/31/2010   Influenza, High Dose Seasonal PF 08/29/2013, 10/27/2022   Influenza-Unspecified 09/13/2012, 08/22/2014, 09/26/2015, 08/29/2016, 08/26/2017, 08/29/2018   PFIZER(Purple Top)SARS-COV-2 Vaccination 01/12/2020, 02/03/2020, 10/01/2020   Pfizer Covid-19 Vaccine Bivalent Booster 45yrs & up 10/27/2022   Pneumococcal Conjugate-13 06/05/2015   Pneumococcal Polysaccharide-23 01/02/2014   RSV,unspecified 09/29/2022   Td 10/19/2000, 11/02/2010   Tdap 01/28/2021   Zoster Recombinant(Shingrix) 04/27/2021, 07/26/2022   Zoster, Live 05/17/2015    Screening Tests Health Maintenance  Topic Date Due   COVID-19 Vaccine (5 - 2024-25 season) 07/31/2023   INFLUENZA VACCINE  06/29/2024   Medicare Annual Wellness (AWV)  03/26/2025   DTaP/Tdap/Td (4 - Td or Tdap) 01/29/2031   Pneumonia Vaccine 41+ Years old  Completed   DEXA SCAN  Completed   Hepatitis C Screening  Completed   Zoster Vaccines- Shingrix  Completed   HPV VACCINES  Aged Out   Meningococcal B Vaccine  Aged Out   Fecal DNA (Cologuard)  Discontinued    Health Maintenance  Health Maintenance Due  Topic Date Due   COVID-19 Vaccine (5 - 2024-25 season) 07/31/2023   Health Maintenance Items Addressed: 03/26/2024   Additional Screening:  Vision Screening: Recommended annual ophthalmology exams for early detection of glaucoma and other disorders of the eye.  Dental Screening: Recommended annual dental exams for proper oral hygiene  Community Resource Referral / Chronic Care Management: CRR required this visit?  No   CCM required this visit?  No     Plan:     I have personally reviewed and noted the following in the patient's chart:   Medical and social history Use of alcohol, tobacco or illicit drugs  Current medications and supplements including opioid prescriptions. Patient is not currently taking opioid prescriptions. Functional ability and status Nutritional status Physical activity Advanced directives List of  other physicians Hospitalizations, surgeries, and ER visits in previous 12 months Vitals Screenings to include cognitive, depression, and falls Referrals and appointments  In addition, I have reviewed and discussed with patient certain preventive protocols, quality metrics, and best practice recommendations. A written personalized care plan for preventive services as well as general preventive health recommendations were provided to patient.     Patria Bookbinder, CMA   03/26/2024   After Visit Summary: (MyChart) Due to this being a telephonic visit, the after visit summary with patients personalized plan was offered to patient via MyChart   Notes: Nothing significant to report at this time.

## 2024-03-26 NOTE — Patient Instructions (Addendum)
 Ms. Hey , Thank you for taking time to come for your Medicare Wellness Visit. I appreciate your ongoing commitment to your health goals. Please review the following plan we discussed and let me know if I can assist you in the future.   Referrals/Orders/Follow-Ups/Clinician Recommendations: Aim for 30 minutes of exercise or brisk walking, 6-8 glasses of water, and 5 servings of fruits and vegetables each day.   This is a list of the screening recommended for you and due dates:  Health Maintenance  Topic Date Due   COVID-19 Vaccine (5 - 2024-25 season) 07/31/2023   Flu Shot  06/29/2024   Medicare Annual Wellness Visit  03/26/2025   DTaP/Tdap/Td vaccine (4 - Td or Tdap) 01/29/2031   Pneumonia Vaccine  Completed   DEXA scan (bone density measurement)  Completed   Hepatitis C Screening  Completed   Zoster (Shingles) Vaccine  Completed   HPV Vaccine  Aged Out   Meningitis B Vaccine  Aged Out   Cologuard (Stool DNA test)  Discontinued    Advanced directives: (In Chart) A copy of your advanced directives are scanned into your chart should your provider ever need it.  Next Medicare Annual Wellness Visit scheduled for next year: Yes

## 2024-03-28 NOTE — Progress Notes (Signed)
 Subjective:   Tiffany House is a 78 y.o. who presents for a Medicare Wellness preventive visit.  Visit Complete: Audio visit  Persons Participating in Visit: Patient.  AWV Questionnaire: No: Patient Medicare AWV questionnaire was not completed prior to this visit.  Cardiac Risk Factors include: advanced age (>32men, >24 women)     Objective:    Today's Vitals   03/26/24 0928  Weight: 115 lb (52.2 kg)  Height: 4\' 11"  (1.499 m)   Body mass index is 23.23 kg/m.     03/26/2024    9:26 AM 03/23/2023   11:15 AM 04/01/2021    6:11 PM 03/13/2021    9:22 PM 05/16/2019    2:47 PM 02/28/2018    1:41 PM 11/25/2016    4:04 PM  Advanced Directives  Does Patient Have a Medical Advance Directive? Yes Yes Yes;No Yes Yes Yes Yes  Type of Estate agent of [place];Living will Healthcare Power of [place];Living will   Healthcare Power of [place];Living will Healthcare Power of [place];Living will Healthcare Power of [place];Living will  Does patient want to make changes to medical advance directive? No - Patient declined No - Patient declined No - Patient declined      Copy of Healthcare Power of Attorney in Chart? Yes - validated most recent copy scanned in chart (See row information) Yes - validated most recent copy scanned in chart (See row information)   No - copy requested No - copy requested No - copy requested    Current Medications (verified) Outpatient Encounter Medications as of 03/26/2024  Medication Sig   acetaminophen  (TYLENOL ) 325 MG tablet Take 650 mg by mouth See admin instructions. Take 650 mg by mouth at bedtime and an additional 650 mg every 6 hours as needed for pain   aspirin  81 MG chewable tablet Chew 1 tablet (81 mg total) by mouth daily.   calcium  carbonate (OS-CAL - DOSED IN MG OF ELEMENTAL CALCIUM ) 1250 (500 Ca) MG tablet Take 1 tablet by mouth 2 (two) times daily with a meal.   Cholecalciferol (VITAMIN D -3) 1000 UNITS CAPS Take 1,000 Units by  mouth in the morning and at bedtime.   diclofenac  sodium (VOLTAREN ) 1 % GEL Apply 2 g topically 4 (four) times daily. (Patient taking differently: Apply 2 g topically 4 (four) times daily as needed (for back or knee pain).)   fish oil-omega-3 fatty acids  1000 MG capsule Take 1 g by mouth every evening.   levothyroxine  (SYNTHROID ) 25 MCG tablet Take 1 tablet (25 mcg total) by mouth daily.   Magnesium Hydroxide (MAGNESIA PO) Take by mouth daily.   Misc Natural Products (JOINT HEALTH PO) Take by mouth.   Multiple Vitamin (MULTIVITAMIN) tablet Take 1 tablet by mouth daily.   multivitamin-lutein (OCUVITE-LUTEIN) CAPS capsule Take 1 capsule by mouth daily.   omeprazole  (PRILOSEC) 40 MG capsule Take 1 capsule (40 mg total) by mouth daily.   PARoxetine  (PAXIL ) 10 MG tablet Take 1 tablet (10 mg total) by mouth daily.   simvastatin  (ZOCOR ) 40 MG tablet TAKE ONE TABLET BY MOUTH DAILY AT 6 P.M.   No facility-administered encounter medications on file as of 03/26/2024.    Allergies (verified) Shellfish allergy and Sulfonamide derivatives   History: Past Medical History:  Diagnosis Date   Allergic rhinitis, cause unspecified    Arthritis    Benign positional vertigo    GERD    HYPERLIPIDEMIA    Hypertension    HYPOTHYROIDISM dx 10/2010   Low back pain  MRI 02/2011   Migraines    OSTEOPENIA    Osteoporosis    Qualifier: Diagnosis of  By: Marthe Slain    RENAL CALCULUS, RIGHT    L 01/2012   Past Surgical History:  Procedure Laterality Date   HAND SURGERY  2001   Dr Lorena Rolling-- surgery of right hand to rebuild joint   HERNIA REPAIR  03/2016   Kidney stone removed  01/12/10   TUBAL LIGATION  1979   Family History  Problem Relation Age of Onset   Hyperlipidemia Mother    Hypertension Mother    Cancer Father        prostate. Louvella Royalty   Coronary artery disease Father    Hypertension Father    Social History   Socioeconomic History   Marital status: Widowed    Spouse name:  widow   Number of children: 0   Years of education: Not on file   Highest education level: Not on file  Occupational History   Occupation: Copywriter, advertising: SELF EMPLOYED  Tobacco Use   Smoking status: Never    Passive exposure: Never   Smokeless tobacco: Never  Vaping Use   Vaping status: Never Used  Substance and Sexual Activity   Alcohol use: Yes    Alcohol/week: 1.0 standard drink of alcohol    Types: 1 Glasses of wine per week    Comment: occasional   Drug use: No   Sexual activity: Never  Other Topics Concern   Not on file  Social History Narrative   Lives alone in town home - handicap accessible restroom   Social Drivers of Health   Financial Resource Strain: Low Risk  (03/26/2024)   Overall Financial Resource Strain (CARDIA)    Difficulty of Paying Living Expenses: Not hard at all  Food Insecurity: No Food Insecurity (03/26/2024)   Hunger Vital Sign    Worried About Running Out of Food in the Last Year: Never true    Ran Out of Food in the Last Year: Never true  Transportation Needs: No Transportation Needs (03/26/2024)   PRAPARE - Administrator, Civil Service (Medical): No    Lack of Transportation (Non-Medical): No  Physical Activity: Insufficiently Active (03/26/2024)   Exercise Vital Sign    Days of Exercise per Week: 2 days    Minutes of Exercise per Session: 20 min  Stress: No Stress Concern Present (03/26/2024)   [employer] of Occupational Health - Occupational Stress Questionnaire    Feeling of Stress : Not at all  Social Connections: Moderately Integrated (03/26/2024)   Social Connection and Isolation Panel [NHANES]    Frequency of Communication with Friends and Family: More than three times a week    Frequency of Social Gatherings with Friends and Family: More than three times a week    Attends Religious Services: More than 4 times per year    Active Member of [employer] or Organizations: Yes    Attends Banker Meetings:  More than 4 times per year    Marital Status: Widowed    Tobacco Counseling Counseling given: No    Clinical Intake:  Pre-visit preparation completed: Yes  Pain : No/denies pain     Nutritional Risks: None Diabetes: No  Lab Results  Component Value Date   HGBA1C 5.9 06/27/2023   HGBA1C 6.1 01/03/2023   HGBA1C 6.0 03/30/2022     How often do you need to have someone help you when you read  instructions, pamphlets, or other written materials from your doctor or pharmacy?: 1 - Never  Interpreter Needed?: No  Information entered by :: Kandy Orris, CMA   Activities of Daily Living     03/26/2024    9:32 AM  In your present state of health, do you have any difficulty performing the following activities:  Hearing? 0  Vision? 0  Difficulty concentrating or making decisions? 0  Walking or climbing stairs? 0  Dressing or bathing? 0  Doing errands, shopping? 0  Preparing Food and eating ? N  Using the Toilet? N  In the past six months, have you accidently leaked urine? N  Do you have problems with loss of bowel control? N  Managing your Medications? N  Managing your Finances? N  Housekeeping or managing your Housekeeping? N    Patient Care Team: Adelia Homestead, MD as PCP - General (Internal Medicine) Lei Pump, MD as Consulting Physician (Cardiology) Clemetine Cypher, [place] as Consulting Physician (Podiatry) Neil Balls, MD as Consulting Physician (Orthopedic Surgery) Sandor Crosser, OD as Referring Physician (Ophthalmology)  Indicate any recent Medical Services you may have received from other than [facility] providers in the past year (date may be approximate).     Assessment:   This is a routine wellness examination for [place].  Hearing/Vision screen Hearing Screening - Comments:: Wears hearing aids - no concerns  Vision Screening - Comments:: Wears rx glasses - up to date with routine eye exams with Sandor Crosser [facility])   Goals Addressed                This Visit's Progress     Patient Stated (pt-stated)        Patient stated she plans to continue staying active and be safe due to having a fall.       Depression Screen     03/26/2024    9:39 AM 06/27/2023    8:07 AM 03/23/2023   11:11 AM 07/29/2022    1:22 PM 03/30/2022    9:27 AM 05/15/2021    3:36 PM 04/01/2021    6:07 PM  PHQ 2/9 Scores  PHQ - 2 Score 0 0 0 0 0 0 0  PHQ- 9 Score 0    1      Fall Risk     03/26/2024    9:33 AM 06/27/2023    8:07 AM 03/23/2023   11:04 AM 07/29/2022    1:22 PM 03/30/2022    9:26 AM  Fall Risk   Falls in the past year? 1 0 0 1 0  Number falls in past yr: 0 0 0 0 0  Comment 1 fall      Injury with Fall? 0 0 0 1 0  Risk for fall due to : History of fall(s)  No Fall Risks No Fall Risks   Follow up Falls evaluation completed;Falls prevention discussed Falls evaluation completed Falls prevention discussed Falls evaluation completed     MEDICARE RISK AT HOME:  Medicare Risk at Home Any stairs in or around the home?: Yes If so, are there any without handrails?: No Home free of loose throw rugs in walkways, pet beds, electrical cords, etc?: Yes Adequate lighting in your home to reduce risk of falls?: Yes Life alert?: No Use of a cane, walker or w/c?: No Grab bars in the bathroom?: Yes Shower chair or bench in shower?: Yes Elevated toilet seat or a handicapped toilet?: Yes  TIMED UP AND GO:  Was  the test performed?  No  Cognitive Function: 6CIT completed        03/26/2024    9:38 AM 03/23/2023   11:15 AM  6CIT Screen  What Year? 0 points 0 points  What month? 0 points 0 points  What time? 0 points 0 points  Count back from 20 0 points 0 points  Months in reverse 0 points 0 points  Repeat phrase 2 points 2 points  Total Score 2 points 2 points    Immunizations Immunization History  Administered Date(s) Administered   Fluad Quad(high Dose 65+) 08/11/2020   Influenza Split 10/06/2011   Influenza Whole 08/19/2008,  08/28/2009, 08/31/2010   Influenza, High Dose Seasonal PF 08/29/2013, 10/27/2022   Influenza-Unspecified 09/13/2012, 08/22/2014, 09/26/2015, 08/29/2016, 08/26/2017, 08/29/2018   PFIZER(Purple Top)SARS-COV-2 Vaccination 01/12/2020, 02/03/2020, 10/01/2020   Pfizer Covid-19 Vaccine Bivalent Booster 90yrs & up 10/27/2022   Pneumococcal Conjugate-13 06/05/2015   Pneumococcal Polysaccharide-23 01/02/2014   RSV,unspecified 09/29/2022   Td 10/19/2000, 11/02/2010   Tdap 01/28/2021   Zoster Recombinant(Shingrix) 04/27/2021, 07/26/2022   Zoster, Live 05/17/2015    Screening Tests Health Maintenance  Topic Date Due   COVID-19 Vaccine (5 - 2024-25 season) 07/31/2023   INFLUENZA VACCINE  06/29/2024   Medicare Annual Wellness (AWV)  03/26/2025   DTaP/Tdap/Td (4 - Td or Tdap) 01/29/2031   Pneumonia Vaccine 36+ Years old  Completed   DEXA SCAN  Completed   Hepatitis C Screening  Completed   Zoster Vaccines- Shingrix  Completed   HPV VACCINES  Aged Out   Meningococcal B Vaccine  Aged Out   Fecal DNA (Cologuard)  Discontinued    Health Maintenance  Health Maintenance Due  Topic Date Due   COVID-19 Vaccine (5 - 2024-25 season) 07/31/2023   Health Maintenance Items Addressed: 03/26/2024   Additional Screening:  Vision Screening: Recommended annual ophthalmology exams for early detection of glaucoma and other disorders of the eye.  Dental Screening: Recommended annual dental exams for proper oral hygiene  Community Resource Referral / Chronic Care Management: CRR required this visit?  No   CCM required this visit?  No     Plan:     I have personally reviewed and noted the following in the patient's chart:   Medical and social history Use of alcohol, tobacco or illicit drugs  Current medications and supplements including opioid prescriptions. Patient is not currently taking opioid prescriptions. Functional ability and status Nutritional status Physical activity Advanced  directives List of other physicians Hospitalizations, surgeries, and ER visits in previous 12 months Vitals Screenings to include cognitive, depression, and falls Referrals and appointments  In addition, I have reviewed and discussed with patient certain preventive protocols, quality metrics, and best practice recommendations. A written personalized care plan for preventive services as well as general preventive health recommendations were provided to patient.     Patria Bookbinder, CMA   03/28/2024   After Visit Summary: (MyChart) Due to this being a telephonic visit, the after visit summary with patients personalized plan was offered to patient via MyChart   Notes: Nothing significant to report at this time.

## 2024-03-28 NOTE — Addendum Note (Signed)
 Addended by: Edra Govern D on: 03/28/2024 04:15 PM   Modules accepted: Orders

## 2024-03-28 NOTE — Progress Notes (Signed)
 [facility] Summary Report / Loop Recorder

## 2024-04-19 ENCOUNTER — Ambulatory Visit: Payer: Self-pay | Admitting: Cardiology

## 2024-04-19 ENCOUNTER — Ambulatory Visit

## 2024-04-19 DIAGNOSIS — I639 Cerebral infarction, unspecified: Secondary | ICD-10-CM | POA: Diagnosis not present

## 2024-04-19 LAB — CUP PACEART REMOTE DEVICE CHECK
Date Time Interrogation Session: 20250521230805
Implantable Pulse Generator Implant Date: 20220509

## 2024-05-04 NOTE — Progress Notes (Signed)
 [facility] Summary Report / Loop Recorder

## 2024-05-04 NOTE — Addendum Note (Signed)
 Addended by: Edra Govern D on: 05/04/2024 03:06 PM   Modules accepted: Orders

## 2024-05-21 ENCOUNTER — Ambulatory Visit (INDEPENDENT_AMBULATORY_CARE_PROVIDER_SITE_OTHER)

## 2024-05-21 DIAGNOSIS — I639 Cerebral infarction, unspecified: Secondary | ICD-10-CM | POA: Diagnosis not present

## 2024-05-21 LAB — CUP PACEART REMOTE DEVICE CHECK
Date Time Interrogation Session: 20250622231130
Implantable Pulse Generator Implant Date: 20220509

## 2024-05-23 ENCOUNTER — Ambulatory Visit: Payer: Self-pay | Admitting: Cardiology

## 2024-05-28 ENCOUNTER — Ambulatory Visit: Payer: Medicare HMO

## 2024-05-31 NOTE — Progress Notes (Signed)
 [facility] Summary Report / Loop Recorder

## 2024-06-12 ENCOUNTER — Other Ambulatory Visit: Payer: Self-pay | Admitting: Internal Medicine

## 2024-06-19 NOTE — Telephone Encounter (Signed)
 Error

## 2024-06-21 ENCOUNTER — Ambulatory Visit

## 2024-06-21 DIAGNOSIS — I639 Cerebral infarction, unspecified: Secondary | ICD-10-CM | POA: Diagnosis not present

## 2024-06-21 LAB — CUP PACEART REMOTE DEVICE CHECK
Date Time Interrogation Session: 20250723231048
Implantable Pulse Generator Implant Date: 20220509

## 2024-06-25 ENCOUNTER — Ambulatory Visit: Payer: Self-pay | Admitting: Cardiology

## 2024-06-25 NOTE — Progress Notes (Signed)
 [facility] Summary Report / Loop Recorder

## 2024-06-27 ENCOUNTER — Encounter: Payer: Self-pay | Admitting: Internal Medicine

## 2024-06-27 ENCOUNTER — Ambulatory Visit (INDEPENDENT_AMBULATORY_CARE_PROVIDER_SITE_OTHER): Admitting: Internal Medicine

## 2024-06-27 VITALS — BP 126/82 | HR 68 | Temp 98.2°F | Ht 59.0 in | Wt 117.5 lb

## 2024-06-27 DIAGNOSIS — Z8673 Personal history of transient ischemic attack (TIA), and cerebral infarction without residual deficits: Secondary | ICD-10-CM

## 2024-06-27 DIAGNOSIS — E782 Mixed hyperlipidemia: Secondary | ICD-10-CM | POA: Diagnosis not present

## 2024-06-27 DIAGNOSIS — F4321 Adjustment disorder with depressed mood: Secondary | ICD-10-CM

## 2024-06-27 DIAGNOSIS — M1711 Unilateral primary osteoarthritis, right knee: Secondary | ICD-10-CM

## 2024-06-27 DIAGNOSIS — K219 Gastro-esophageal reflux disease without esophagitis: Secondary | ICD-10-CM | POA: Diagnosis not present

## 2024-06-27 DIAGNOSIS — R7303 Prediabetes: Secondary | ICD-10-CM

## 2024-06-27 DIAGNOSIS — E039 Hypothyroidism, unspecified: Secondary | ICD-10-CM

## 2024-06-27 DIAGNOSIS — Z0000 Encounter for general adult medical examination without abnormal findings: Secondary | ICD-10-CM

## 2024-06-27 LAB — TSH: TSH: 3.27 u[IU]/mL (ref 0.35–5.50)

## 2024-06-27 LAB — COMPREHENSIVE METABOLIC PANEL WITH GFR
ALT: 14 U/L (ref 0–35)
AST: 22 U/L (ref 0–37)
Albumin: 4.4 g/dL (ref 3.5–5.2)
Alkaline Phosphatase: 64 U/L (ref 39–117)
BUN: 16 mg/dL (ref 6–23)
CO2: 27 meq/L (ref 19–32)
Calcium: 9.9 mg/dL (ref 8.4–10.5)
Chloride: 106 meq/L (ref 96–112)
Creatinine, Ser: 1.03 mg/dL (ref 0.40–1.20)
GFR: 51.77 mL/min — ABNORMAL LOW (ref >60.00)
Glucose, Bld: 85 mg/dL (ref 70–99)
Potassium: 4.4 meq/L (ref 3.5–5.1)
Sodium: 141 meq/L (ref 135–145)
Total Bilirubin: 0.5 mg/dL (ref 0.2–1.2)
Total Protein: 7.2 g/dL (ref 6.0–8.3)

## 2024-06-27 LAB — CBC
HCT: 31.9 % — ABNORMAL LOW (ref 36.0–46.0)
Hemoglobin: 10.2 g/dL — ABNORMAL LOW (ref 12.0–15.0)
MCHC: 31.8 g/dL (ref 30.0–36.0)
MCV: 74.3 fl — ABNORMAL LOW (ref 78.0–100.0)
Platelets: 373 K/uL (ref 150.0–400.0)
RBC: 4.29 Mil/uL (ref 3.87–5.11)
RDW: 17.2 % — ABNORMAL HIGH (ref 11.5–15.5)
WBC: 9.6 K/uL (ref 4.0–10.5)

## 2024-06-27 LAB — LIPID PANEL
Cholesterol: 162 mg/dL (ref 0–200)
HDL: 52.6 mg/dL (ref >39.00)
LDL Cholesterol: 94 mg/dL (ref 0–99)
NonHDL: 109.35
Total CHOL/HDL Ratio: 3
Triglycerides: 78 mg/dL (ref 0.0–149.0)
VLDL: 15.6 mg/dL (ref 0.0–40.0)

## 2024-06-27 LAB — T4, FREE: Free T4: 0.77 ng/dL (ref 0.60–1.60)

## 2024-06-27 LAB — HEMOGLOBIN A1C: Hgb A1c MFr Bld: 6 % (ref 4.6–6.5)

## 2024-06-27 MED ORDER — PAROXETINE HCL 10 MG PO TABS: 10.0000 mg | ORAL_TABLET | Freq: Every day | ORAL | 3 refills | Status: DC

## 2024-06-27 MED ORDER — LEVOTHYROXINE SODIUM 25 MCG PO TABS: 25.0000 ug | ORAL_TABLET | Freq: Every day | ORAL | 3 refills | Status: DC

## 2024-06-27 MED ORDER — SIMVASTATIN 40 MG PO TABS: 40.0000 mg | ORAL_TABLET | Freq: Every day | ORAL | 3 refills | Status: DC

## 2024-06-27 MED ORDER — OMEPRAZOLE 40 MG PO CPDR: 40.0000 mg | DELAYED_RELEASE_CAPSULE | Freq: Every day | ORAL | 3 refills | Status: DC

## 2024-06-27 NOTE — Progress Notes (Unsigned)
 9  Subjective:   Patient ID: Tiffany House, female    DOB: [birth year], 78 y.o.   MRN: [id number]  HPI Discussed the use of AI scribe software for clinical note transcription with the patient, who gave verbal consent to proceed.  History of Present Illness Tiffany House is a 78 year old female coming in for physical.  She experienced knee pain following a fall on an escalator at the [facility] airport in January while holding a suitcase, resulting in bruises on her leg but no cuts. She believes the fall may have worsened her pre-existing arthritis, as she had previously consulted an orthopedist who advised against knee replacement at that time.  The knee pain is significant, particularly when twisting awkwardly or moving in bed, and is more pronounced when descending stairs compared to ascending. She has been using stairs frequently while staying with her brother and sister-in-law in [place] , which adds stress to her knees. There is no instability or sensation of the knee giving out, but she is cautious when stepping down stairs due to pain.  She has a history of arthritis in both knees, with the right knee being worse than the left. There has been no new swelling or significant changes since the fall, and the bruises have faded. She has tried knee braces in the past but found them uncomfortable.  Losing weight has helped alleviate some stress on her knees. She maintains her weight loss by watching her portions and avoiding snacking.  PMH, [facility], social history reviewed and updated  Review of Systems  Constitutional: Negative.   HENT: Negative.    Eyes: Negative.   Respiratory:  Negative for cough, chest tightness and shortness of breath.   Cardiovascular:  Negative for chest pain, palpitations and leg swelling.  Gastrointestinal:  Negative for abdominal distention, abdominal pain, constipation, diarrhea, nausea and vomiting.  Musculoskeletal:  Positive for arthralgias.  Skin: Negative.    Neurological: Negative.   Psychiatric/Behavioral: Negative.      Objective:  Physical Exam Constitutional:      Appearance: She is well-developed.  HENT:     Head: Normocephalic and atraumatic.  Cardiovascular:     Rate and Rhythm: Normal rate and regular rhythm.  Pulmonary:     Effort: Pulmonary effort is normal. No respiratory distress.     Breath sounds: Normal breath sounds. No wheezing or rales.  Abdominal:     General: Bowel sounds are normal. There is no distension.     Palpations: Abdomen is soft.     Tenderness: There is no abdominal tenderness. There is no rebound.  Musculoskeletal:        General: Tenderness present.     Cervical back: Normal range of motion.  Skin:    General: Skin is warm and dry.  Neurological:     Mental Status: She is alert and oriented to person, place, and time.     Coordination: Coordination normal.    Vitals:   06/27/24 1105  BP: 126/82  Pulse: 68  Temp: 98.2 F (36.8 C)  TempSrc: Temporal  SpO2: 97%  Weight: 117 lb 8 oz (53.3 kg)  Height: 4' 11 (1.499 m)    Assessment & Plan:

## 2024-06-29 ENCOUNTER — Ambulatory Visit: Payer: Self-pay | Admitting: Internal Medicine

## 2024-06-29 NOTE — Assessment & Plan Note (Signed)
 Taking paxil  10 mg daily and well controlled. Will continue.

## 2024-06-29 NOTE — Assessment & Plan Note (Signed)
 Checking HGA1c and adjust as needed.

## 2024-06-29 NOTE — Assessment & Plan Note (Signed)
Checking lipid panel and adjust simvastatin as needed.  

## 2024-06-29 NOTE — Assessment & Plan Note (Signed)
 Taking omeprazole  daily and will continue.

## 2024-06-29 NOTE — Assessment & Plan Note (Signed)
 OVerall worsening and will return to orthopedics.

## 2024-06-29 NOTE — Assessment & Plan Note (Signed)
Flu shot yearly. Pneumonia complete. Shingrix complete. Tetanus up to date. Colonoscopy aged out. Mammogram aged out, pap smear aged out and dexa up to date. Counseled about sun safety and mole surveillance. Counseled about the dangers of distracted driving. Given 10 year screening recommendations.

## 2024-06-29 NOTE — Assessment & Plan Note (Signed)
 No new symptoms. Continue aspirin  and simvastatin . Checking lipid panel and HgA1c. BP at goal.

## 2024-06-29 NOTE — Assessment & Plan Note (Signed)
Checking TSH and adjust as needed synthroid 25 mcg daily.

## 2024-07-02 ENCOUNTER — Ambulatory Visit: Payer: Medicare HMO

## 2024-07-07 ENCOUNTER — Other Ambulatory Visit: Payer: Self-pay | Admitting: Internal Medicine

## 2024-07-09 NOTE — Telephone Encounter (Signed)
 Not on med list, okay to fill?

## 2024-07-23 ENCOUNTER — Ambulatory Visit

## 2024-07-23 DIAGNOSIS — I639 Cerebral infarction, unspecified: Secondary | ICD-10-CM | POA: Diagnosis not present

## 2024-07-24 LAB — CUP PACEART REMOTE DEVICE CHECK
Date Time Interrogation Session: 20250823230710
Implantable Pulse Generator Implant Date: 20220509

## 2024-07-25 ENCOUNTER — Ambulatory Visit: Payer: Self-pay | Admitting: Cardiology

## 2024-08-06 ENCOUNTER — Ambulatory Visit: Payer: Medicare HMO

## 2024-08-15 NOTE — Progress Notes (Signed)
 Remote Loop Recorder Transmission

## 2024-08-23 ENCOUNTER — Ambulatory Visit

## 2024-08-23 DIAGNOSIS — I639 Cerebral infarction, unspecified: Secondary | ICD-10-CM | POA: Diagnosis not present

## 2024-08-23 LAB — CUP PACEART REMOTE DEVICE CHECK
Date Time Interrogation Session: 20250923230709
Implantable Pulse Generator Implant Date: 20220509

## 2024-08-24 ENCOUNTER — Ambulatory Visit: Payer: Self-pay | Admitting: Cardiology

## 2024-08-27 NOTE — Progress Notes (Signed)
 Remote Loop Recorder Transmission

## 2024-08-30 NOTE — Progress Notes (Signed)
 Remote Loop Recorder Transmission

## 2024-09-06 ENCOUNTER — Ambulatory Visit

## 2024-09-24 ENCOUNTER — Ambulatory Visit

## 2024-09-24 LAB — CUP PACEART REMOTE DEVICE CHECK
Date Time Interrogation Session: 20251026231138
Implantable Pulse Generator Implant Date: 20220509

## 2024-09-25 ENCOUNTER — Ambulatory Visit: Payer: Self-pay | Admitting: Cardiology

## 2024-10-08 ENCOUNTER — Ambulatory Visit

## 2024-10-25 ENCOUNTER — Ambulatory Visit

## 2024-10-25 DIAGNOSIS — I639 Cerebral infarction, unspecified: Secondary | ICD-10-CM

## 2024-10-27 LAB — CUP PACEART REMOTE DEVICE CHECK
Date Time Interrogation Session: 20251126230417
Implantable Pulse Generator Implant Date: 20220509

## 2024-10-31 NOTE — Progress Notes (Signed)
 Remote Loop Recorder Transmission

## 2024-11-01 ENCOUNTER — Ambulatory Visit: Payer: Self-pay | Admitting: Cardiology

## 2024-11-08 ENCOUNTER — Ambulatory Visit

## 2024-11-25 ENCOUNTER — Ambulatory Visit

## 2024-11-25 DIAGNOSIS — I639 Cerebral infarction, unspecified: Secondary | ICD-10-CM

## 2024-11-26 ENCOUNTER — Ambulatory Visit

## 2024-11-27 ENCOUNTER — Ambulatory Visit: Payer: Self-pay | Admitting: Cardiology

## 2024-11-27 LAB — CUP PACEART REMOTE DEVICE CHECK
Date Time Interrogation Session: 20251227230944
Implantable Pulse Generator Implant Date: 20220509

## 2024-12-03 NOTE — Progress Notes (Signed)
 Remote Loop Recorder Transmission

## 2024-12-10 ENCOUNTER — Ambulatory Visit

## 2024-12-26 ENCOUNTER — Other Ambulatory Visit: Payer: Self-pay | Admitting: Internal Medicine

## 2024-12-26 ENCOUNTER — Ambulatory Visit

## 2024-12-26 DIAGNOSIS — I639 Cerebral infarction, unspecified: Secondary | ICD-10-CM

## 2024-12-26 LAB — CUP PACEART REMOTE DEVICE CHECK
Date Time Interrogation Session: 20260127230724
Implantable Pulse Generator Implant Date: 20220509

## 2024-12-27 ENCOUNTER — Ambulatory Visit: Payer: Self-pay | Admitting: Cardiology

## 2024-12-27 ENCOUNTER — Ambulatory Visit

## 2025-01-03 NOTE — Progress Notes (Signed)
 Remote Loop Recorder Transmission

## 2025-01-26 ENCOUNTER — Ambulatory Visit

## 2025-01-28 ENCOUNTER — Ambulatory Visit

## 2025-02-26 ENCOUNTER — Ambulatory Visit

## 2025-02-28 ENCOUNTER — Ambulatory Visit

## 2025-03-28 ENCOUNTER — Ambulatory Visit

## 2025-03-29 ENCOUNTER — Ambulatory Visit

## 2025-04-01 ENCOUNTER — Ambulatory Visit

## 2025-05-02 ENCOUNTER — Ambulatory Visit

## 2025-06-03 ENCOUNTER — Ambulatory Visit

## 2025-07-04 ENCOUNTER — Ambulatory Visit
